# Patient Record
Sex: Male | Born: 1955 | Race: White | Hispanic: No | Marital: Single | Smoking: Current some day smoker
Health system: Southern US, Community
[De-identification: ages and names within clinical notes are randomized; demographics above are authoritative.]

## PROBLEM LIST (undated history)

## (undated) HISTORY — PX: REPAIR OF PERFORATED ULCER: SHX6065

---

## 2002-07-11 ENCOUNTER — Emergency Department (HOSPITAL_COMMUNITY): Admission: EM | Admit: 2002-07-11 | Discharge: 2002-07-11 | Payer: Self-pay | Admitting: Emergency Medicine

## 2002-07-11 ENCOUNTER — Encounter: Payer: Self-pay | Admitting: Emergency Medicine

## 2004-07-08 ENCOUNTER — Inpatient Hospital Stay (HOSPITAL_COMMUNITY): Admission: EM | Admit: 2004-07-08 | Discharge: 2004-07-13 | Payer: Self-pay | Admitting: Emergency Medicine

## 2005-12-25 ENCOUNTER — Emergency Department (HOSPITAL_COMMUNITY): Admission: EM | Admit: 2005-12-25 | Discharge: 2005-12-26 | Payer: Self-pay | Admitting: Emergency Medicine

## 2014-05-23 ENCOUNTER — Encounter (HOSPITAL_COMMUNITY): Payer: Self-pay

## 2014-05-23 ENCOUNTER — Other Ambulatory Visit: Payer: Self-pay

## 2014-05-23 ENCOUNTER — Emergency Department (HOSPITAL_COMMUNITY)
Admission: EM | Admit: 2014-05-23 | Discharge: 2014-05-23 | Disposition: A | Discharge status: Home / Self Care | Payer: Self-pay | Attending: Emergency Medicine | Admitting: Emergency Medicine

## 2014-05-23 ENCOUNTER — Emergency Department (HOSPITAL_COMMUNITY): Payer: Self-pay

## 2014-05-23 DIAGNOSIS — H1133 Conjunctival hemorrhage, bilateral: Secondary | ICD-10-CM | POA: Insufficient documentation

## 2014-05-23 DIAGNOSIS — Y9289 Other specified places as the place of occurrence of the external cause: Secondary | ICD-10-CM | POA: Insufficient documentation

## 2014-05-23 DIAGNOSIS — Y9389 Activity, other specified: Secondary | ICD-10-CM | POA: Insufficient documentation

## 2014-05-23 DIAGNOSIS — Z23 Encounter for immunization: Secondary | ICD-10-CM | POA: Insufficient documentation

## 2014-05-23 DIAGNOSIS — S01511A Laceration without foreign body of lip, initial encounter: Secondary | ICD-10-CM | POA: Insufficient documentation

## 2014-05-23 DIAGNOSIS — S022XXB Fracture of nasal bones, initial encounter for open fracture: Secondary | ICD-10-CM | POA: Insufficient documentation

## 2014-05-23 DIAGNOSIS — Y998 Other external cause status: Secondary | ICD-10-CM | POA: Insufficient documentation

## 2014-05-23 DIAGNOSIS — Z720 Tobacco use: Secondary | ICD-10-CM | POA: Insufficient documentation

## 2014-05-23 DIAGNOSIS — S40011A Contusion of right shoulder, initial encounter: Secondary | ICD-10-CM | POA: Insufficient documentation

## 2014-05-23 DIAGNOSIS — S20211A Contusion of right front wall of thorax, initial encounter: Secondary | ICD-10-CM | POA: Insufficient documentation

## 2014-05-23 LAB — ETHANOL: Alcohol, Ethyl (B): 277 mg/dL — ABNORMAL HIGH (ref <5)

## 2014-05-23 LAB — RAPID URINE DRUG SCREEN, HOSP PERFORMED
AMPHETAMINES: NOT DETECTED
BARBITURATES: NOT DETECTED
Benzodiazepines: NOT DETECTED
Cocaine: POSITIVE — AB
OPIATES: NOT DETECTED
TETRAHYDROCANNABINOL: NOT DETECTED

## 2014-05-23 MED ORDER — HYDROCODONE-ACETAMINOPHEN 5-325 MG PO TABS: 1.0000 | ORAL_TABLET | Freq: Four times a day (QID) | ORAL | Status: DC | PRN

## 2014-05-23 MED ORDER — CEPHALEXIN 500 MG PO CAPS: 500.0000 mg | ORAL_CAPSULE | Freq: Four times a day (QID) | ORAL | Status: DC

## 2014-05-23 MED ORDER — TETANUS-DIPHTH-ACELL PERTUSSIS 5-2.5-18.5 LF-MCG/0.5 IM SUSP
0.5000 mL | Freq: Once | INTRAMUSCULAR | Status: AC
  Administered 2014-05-23: 0.5 mL via INTRAMUSCULAR
  Filled 2014-05-23: qty 0.5

## 2014-05-23 MED ORDER — LIDOCAINE HCL (PF) 1 % IJ SOLN
10.0000 mL | Freq: Once | INTRAMUSCULAR | Status: AC
  Administered 2014-05-23: 10 mL via INTRADERMAL
  Filled 2014-05-23: qty 10

## 2014-05-23 MED ORDER — LIDOCAINE HCL (PF) 1 % IJ SOLN: 30.0000 mL | Freq: Once | INTRAMUSCULAR | Status: DC
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
## 2014-05-23 NOTE — ED Provider Notes (Signed)
LACERATION REPAIR Date/Time: 05/23/2014 7:36 AM Performed by: Margarita Mail Authorized by: Margarita Mail Consent: Verbal consent obtained. Consent given by: patient Patient identity confirmed: provided demographic data Time out: Immediately prior to procedure a "time out" was called to verify the correct patient, procedure, equipment, support staff and site/side marked as required. Body area: mouth Location details: upper lip, interior Laceration length: 3 cm Vascular damage: no Anesthesia: local infiltration Local anesthetic: lidocaine 1% without epinephrine Anesthetic total: 3 ml Patient sedated: no Preparation: Patient was prepped and draped in the usual sterile fashion. Irrigation solution: saline Irrigation method: syringe Amount of cleaning: extensive Debridement: none Subcutaneous closure: 5-0 Vicryl Number of sutures: 5 Technique: complex and simple (SI) Approximation: loose Approximation difficulty: complex Patient tolerance: Patient tolerated the procedure well with no immediate complications Comments: Through and through lip lac involving the vermilion border  LACERATION REPAIR Date/Time: 05/23/2014 7:40 AM Performed by: Margarita Mail Authorized by: Margarita Mail Consent: Verbal consent obtained. Risks and benefits: risks, benefits and alternatives were discussed Consent given by: patient Patient identity confirmed: provided demographic data Time out: Immediately prior to procedure a "time out" was called to verify the correct patient, procedure, equipment, support staff and site/side marked as required. Body area: mouth Location details: upper lip, interior Laceration length: 1 cm Foreign bodies: no foreign bodies Tendon involvement: none Nerve involvement: none Vascular damage: no Anesthesia: local infiltration Local anesthetic: lidocaine 1% without epinephrine and co-phenylcaine spray Anesthetic total: 1 ml Patient sedated: no Preparation: Patient  was prepped and draped in the usual sterile fashion. Irrigation solution: saline Irrigation method: syringe Amount of cleaning: extensive Debridement: none Fascia closure: 5-0 Vicryl Number of sutures: 1 (figure of 8) Technique: complex Patient tolerance: Patient tolerated the procedure well with no immediate complications Comments: Loose   LACERATION REPAIR Date/Time: 05/23/2014 7:44 AM Performed by: Margarita Mail Authorized by: Margarita Mail Consent: Verbal consent obtained. Risks and benefits: risks, benefits and alternatives were discussed Patient identity confirmed: verbally with patient Time out: Immediately prior to procedure a "time out" was called to verify the correct patient, procedure, equipment, support staff and site/side marked as required. Body area: head/neck Laceration length: 1 (stellate) cm Foreign bodies: no foreign bodies Tendon involvement: none Nerve involvement: none Anesthesia: local infiltration Local anesthetic: lidocaine 1% without epinephrine Anesthetic total: 1 ml Patient sedated: no Preparation: Patient was prepped and draped in the usual sterile fashion. Irrigation solution: saline Irrigation method: syringe Amount of cleaning: standard Debridement: none Degree of undermining: none Subcutaneous closure: 5-0 Vicryl Number of sutures: 1 Technique: complex (purse string) Patient tolerance: Patient tolerated the procedure well with no immediate complications Comments: Stellate laceration     Margarita Mail, PA-C 05/23/14 [id number]  [name], MD 05/24/14 [phone]
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
## 2014-05-23 NOTE — ED Notes (Signed)
Margarita Mail, PA in room to suture pt's wound
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
## 2014-05-23 NOTE — ED Notes (Addendum)
Pt ambulated 50+ feet in hallway. Pt has steady gait. Denies dizziness, lightheadedness, or any new pain with ambulation. Pt returned to bed.

## 2014-05-23 NOTE — ED Notes (Signed)
PT offered meal and drink.  Sitting up in bed eating at this time.

## 2019-03-09 DIAGNOSIS — K922 Gastrointestinal hemorrhage, unspecified: Secondary | ICD-10-CM

## 2019-03-09 HISTORY — DX: Gastrointestinal hemorrhage, unspecified: K92.2

## 2019-03-14 ENCOUNTER — Inpatient Hospital Stay (HOSPITAL_COMMUNITY): Payer: Self-pay

## 2019-03-14 ENCOUNTER — Emergency Department (HOSPITAL_COMMUNITY): Payer: Self-pay

## 2019-03-14 ENCOUNTER — Other Ambulatory Visit: Payer: Self-pay

## 2019-03-14 ENCOUNTER — Encounter (HOSPITAL_COMMUNITY): Payer: Self-pay | Admitting: *Deleted

## 2019-03-14 ENCOUNTER — Inpatient Hospital Stay (HOSPITAL_COMMUNITY)
Admission: EM | Admit: 2019-03-14 | Discharge: 2019-03-18 | DRG: 433 | Disposition: A | Discharge status: Home / Self Care | Payer: Self-pay | Attending: Internal Medicine | Admitting: Internal Medicine

## 2019-03-14 DIAGNOSIS — L299 Pruritus, unspecified: Secondary | ICD-10-CM | POA: Diagnosis not present

## 2019-03-14 DIAGNOSIS — F17200 Nicotine dependence, unspecified, uncomplicated: Secondary | ICD-10-CM | POA: Diagnosis present

## 2019-03-14 DIAGNOSIS — K766 Portal hypertension: Secondary | ICD-10-CM | POA: Diagnosis present

## 2019-03-14 DIAGNOSIS — N179 Acute kidney failure, unspecified: Secondary | ICD-10-CM

## 2019-03-14 DIAGNOSIS — Z9889 Other specified postprocedural states: Secondary | ICD-10-CM

## 2019-03-14 DIAGNOSIS — F1490 Cocaine use, unspecified, uncomplicated: Secondary | ICD-10-CM | POA: Diagnosis present

## 2019-03-14 DIAGNOSIS — E872 Acidosis, unspecified: Secondary | ICD-10-CM

## 2019-03-14 DIAGNOSIS — Z720 Tobacco use: Secondary | ICD-10-CM

## 2019-03-14 DIAGNOSIS — I8510 Secondary esophageal varices without bleeding: Secondary | ICD-10-CM | POA: Diagnosis present

## 2019-03-14 DIAGNOSIS — K700 Alcoholic fatty liver: Secondary | ICD-10-CM | POA: Diagnosis present

## 2019-03-14 DIAGNOSIS — K709 Alcoholic liver disease, unspecified: Secondary | ICD-10-CM

## 2019-03-14 DIAGNOSIS — I959 Hypotension, unspecified: Secondary | ICD-10-CM | POA: Diagnosis not present

## 2019-03-14 DIAGNOSIS — D7589 Other specified diseases of blood and blood-forming organs: Secondary | ICD-10-CM

## 2019-03-14 DIAGNOSIS — F1020 Alcohol dependence, uncomplicated: Secondary | ICD-10-CM

## 2019-03-14 DIAGNOSIS — K633 Ulcer of intestine: Secondary | ICD-10-CM | POA: Diagnosis present

## 2019-03-14 DIAGNOSIS — K7030 Alcoholic cirrhosis of liver without ascites: Principal | ICD-10-CM | POA: Diagnosis present

## 2019-03-14 DIAGNOSIS — K275 Chronic or unspecified peptic ulcer, site unspecified, with perforation: Secondary | ICD-10-CM

## 2019-03-14 DIAGNOSIS — K922 Gastrointestinal hemorrhage, unspecified: Secondary | ICD-10-CM

## 2019-03-14 DIAGNOSIS — K5730 Diverticulosis of large intestine without perforation or abscess without bleeding: Secondary | ICD-10-CM | POA: Diagnosis present

## 2019-03-14 DIAGNOSIS — F10230 Alcohol dependence with withdrawal, uncomplicated: Secondary | ICD-10-CM | POA: Diagnosis present

## 2019-03-14 DIAGNOSIS — K635 Polyp of colon: Secondary | ICD-10-CM | POA: Diagnosis present

## 2019-03-14 DIAGNOSIS — D62 Acute posthemorrhagic anemia: Secondary | ICD-10-CM | POA: Diagnosis present

## 2019-03-14 DIAGNOSIS — D649 Anemia, unspecified: Secondary | ICD-10-CM

## 2019-03-14 DIAGNOSIS — Z20822 Contact with and (suspected) exposure to covid-19: Secondary | ICD-10-CM | POA: Diagnosis present

## 2019-03-14 DIAGNOSIS — K439 Ventral hernia without obstruction or gangrene: Secondary | ICD-10-CM | POA: Diagnosis present

## 2019-03-14 DIAGNOSIS — R791 Abnormal coagulation profile: Secondary | ICD-10-CM | POA: Diagnosis not present

## 2019-03-14 DIAGNOSIS — K760 Fatty (change of) liver, not elsewhere classified: Secondary | ICD-10-CM

## 2019-03-14 DIAGNOSIS — F10930 Alcohol use, unspecified with withdrawal, uncomplicated: Secondary | ICD-10-CM

## 2019-03-14 DIAGNOSIS — K769 Liver disease, unspecified: Secondary | ICD-10-CM

## 2019-03-14 DIAGNOSIS — K3189 Other diseases of stomach and duodenum: Secondary | ICD-10-CM | POA: Diagnosis present

## 2019-03-14 DIAGNOSIS — K921 Melena: Secondary | ICD-10-CM

## 2019-03-14 DIAGNOSIS — D72829 Elevated white blood cell count, unspecified: Secondary | ICD-10-CM | POA: Diagnosis not present

## 2019-03-14 LAB — RESPIRATORY PANEL BY RT PCR (FLU A&B, COVID)
Influenza A by PCR: NEGATIVE
Influenza B by PCR: NEGATIVE
SARS Coronavirus 2 by RT PCR: NEGATIVE

## 2019-03-14 LAB — COMPREHENSIVE METABOLIC PANEL
ALT: 20 U/L (ref 0–44)
AST: 46 U/L — ABNORMAL HIGH (ref 15–41)
Albumin: 2.2 g/dL — ABNORMAL LOW (ref 3.5–5.0)
Alkaline Phosphatase: 51 U/L (ref 38–126)
Anion gap: 15 (ref 5–15)
BUN: 32 mg/dL — ABNORMAL HIGH (ref 8–23)
CO2: 11 mmol/L — ABNORMAL LOW (ref 22–32)
Calcium: 8 mg/dL — ABNORMAL LOW (ref 8.9–10.3)
Chloride: 114 mmol/L — ABNORMAL HIGH (ref 98–111)
Creatinine, Ser: 1.64 mg/dL — ABNORMAL HIGH (ref 0.61–1.24)
GFR calc Af Amer: 52 mL/min — ABNORMAL LOW (ref >60)
GFR calc non Af Amer: 44 mL/min — ABNORMAL LOW (ref >60)
Glucose, Bld: 167 mg/dL — ABNORMAL HIGH (ref 70–99)
Potassium: 5.1 mmol/L (ref 3.5–5.1)
Sodium: 140 mmol/L (ref 135–145)
Total Bilirubin: 1.5 mg/dL — ABNORMAL HIGH (ref 0.3–1.2)
Total Protein: 4.7 g/dL — ABNORMAL LOW (ref 6.5–8.1)

## 2019-03-14 LAB — CBC WITH DIFFERENTIAL/PLATELET
Abs Immature Granulocytes: 0.11 10*3/uL — ABNORMAL HIGH (ref 0.00–0.07)
Basophils Absolute: 0.1 10*3/uL (ref 0.0–0.1)
Basophils Relative: 0 %
Eosinophils Absolute: 0.1 10*3/uL (ref 0.0–0.5)
Eosinophils Relative: 1 %
HCT: 16.1 % — ABNORMAL LOW (ref 39.0–52.0)
Hemoglobin: 5 g/dL — CL (ref 13.0–17.0)
Immature Granulocytes: 1 %
Lymphocytes Relative: 16 %
Lymphs Abs: 2.5 10*3/uL (ref 0.7–4.0)
MCH: 35.2 pg — ABNORMAL HIGH (ref 26.0–34.0)
MCHC: 31.1 g/dL (ref 30.0–36.0)
MCV: 113.4 fL — ABNORMAL HIGH (ref 80.0–100.0)
Monocytes Absolute: 1.4 10*3/uL — ABNORMAL HIGH (ref 0.1–1.0)
Monocytes Relative: 9 %
Neutro Abs: 11.1 10*3/uL — ABNORMAL HIGH (ref 1.7–7.7)
Neutrophils Relative %: 73 %
Platelets: 161 10*3/uL (ref 150–400)
RBC: 1.42 MIL/uL — ABNORMAL LOW (ref 4.22–5.81)
RDW: 14.6 % (ref 11.5–15.5)
WBC: 15.1 10*3/uL — ABNORMAL HIGH (ref 4.0–10.5)
nRBC: 0 % (ref 0.0–0.2)

## 2019-03-14 LAB — MAGNESIUM: Magnesium: 1.6 mg/dL — ABNORMAL LOW (ref 1.7–2.4)

## 2019-03-14 LAB — APTT: aPTT: 31 seconds (ref 24–36)

## 2019-03-14 LAB — POC OCCULT BLOOD, ED: Fecal Occult Bld: POSITIVE — AB

## 2019-03-14 LAB — VITAMIN B12: Vitamin B-12: 246 pg/mL (ref 180–914)

## 2019-03-14 LAB — HEPATITIS C ANTIBODY: HCV Ab: NONREACTIVE

## 2019-03-14 LAB — PROTIME-INR
INR: 1.8 — ABNORMAL HIGH (ref 0.8–1.2)
Prothrombin Time: 20.5 seconds — ABNORMAL HIGH (ref 11.4–15.2)

## 2019-03-14 LAB — ABO/RH: ABO/RH(D): A POS

## 2019-03-14 LAB — LACTIC ACID, PLASMA: Lactic Acid, Venous: 3.8 mmol/L (ref 0.5–1.9)

## 2019-03-14 LAB — PREPARE RBC (CROSSMATCH)

## 2019-03-14 LAB — HEPATITIS B SURFACE ANTIGEN: Hepatitis B Surface Ag: NONREACTIVE

## 2019-03-14 LAB — HIV ANTIBODY (ROUTINE TESTING W REFLEX): HIV Screen 4th Generation wRfx: NONREACTIVE

## 2019-03-14 LAB — HEPATITIS B CORE ANTIBODY, TOTAL: Hep B Core Total Ab: NONREACTIVE

## 2019-03-14 LAB — PROCALCITONIN: Procalcitonin: 0.12 ng/mL

## 2019-03-14 LAB — PHOSPHORUS: Phosphorus: 4.9 mg/dL — ABNORMAL HIGH (ref 2.5–4.6)

## 2019-03-14 MED ORDER — SODIUM CHLORIDE 0.9 % IV SOLN
80.0000 mg | Freq: Once | INTRAVENOUS | Status: AC
  Administered 2019-03-14: 80 mg via INTRAVENOUS
  Filled 2019-03-14: qty 80

## 2019-03-14 MED ORDER — SODIUM CHLORIDE 0.9 % IV SOLN: INTRAVENOUS | Status: DC

## 2019-03-14 MED ORDER — THIAMINE HCL 100 MG PO TABS
100.0000 mg | ORAL_TABLET | Freq: Every day | ORAL | Status: DC
  Administered 2019-03-14 – 2019-03-18 (×4): 100 mg via ORAL
  Filled 2019-03-14 (×4): qty 1

## 2019-03-14 MED ORDER — SODIUM CHLORIDE 0.9 % IV SOLN
1.0000 g | Freq: Once | INTRAVENOUS | Status: AC
  Administered 2019-03-14: 1 g via INTRAVENOUS
  Filled 2019-03-14: qty 10

## 2019-03-14 MED ORDER — POLYETHYLENE GLYCOL 3350 17 G PO PACK
17.0000 g | PACK | Freq: Two times a day (BID) | ORAL | Status: DC
  Administered 2019-03-15 (×2): 17 g via ORAL
  Filled 2019-03-14 (×2): qty 1

## 2019-03-14 MED ORDER — PANTOPRAZOLE SODIUM 40 MG IV SOLR: 40.0000 mg | Freq: Two times a day (BID) | INTRAVENOUS | Status: DC

## 2019-03-14 MED ORDER — LABETALOL HCL 5 MG/ML IV SOLN: 5.0000 mg | Freq: Four times a day (QID) | INTRAVENOUS | Status: DC | PRN

## 2019-03-14 MED ORDER — OCTREOTIDE LOAD VIA INFUSION
50.0000 ug | Freq: Once | INTRAVENOUS | Status: DC
  Filled 2019-03-14: qty 25

## 2019-03-14 MED ORDER — PROCHLORPERAZINE EDISYLATE 10 MG/2ML IJ SOLN: 10.0000 mg | Freq: Four times a day (QID) | INTRAMUSCULAR | Status: DC | PRN

## 2019-03-14 MED ORDER — SODIUM CHLORIDE 0.9% IV SOLUTION: Freq: Once | INTRAVENOUS | Status: AC

## 2019-03-14 MED ORDER — THIAMINE HCL 100 MG/ML IJ SOLN
100.0000 mg | Freq: Every day | INTRAMUSCULAR | Status: DC
  Filled 2019-03-14: qty 2

## 2019-03-14 MED ORDER — FOLIC ACID 1 MG PO TABS
1.0000 mg | ORAL_TABLET | Freq: Every day | ORAL | Status: DC
  Administered 2019-03-14 – 2019-03-18 (×4): 1 mg via ORAL
  Filled 2019-03-14 (×4): qty 1

## 2019-03-14 MED ORDER — SODIUM CHLORIDE 0.9 % IV SOLN
8.0000 mg/h | INTRAVENOUS | Status: DC
  Administered 2019-03-14 – 2019-03-16 (×5): 8 mg/h via INTRAVENOUS
  Filled 2019-03-14 (×6): qty 80

## 2019-03-14 MED ORDER — LORAZEPAM 2 MG/ML IJ SOLN: 1.0000 mg | INTRAMUSCULAR | Status: DC | PRN

## 2019-03-14 MED ORDER — LORAZEPAM 2 MG/ML IJ SOLN
1.0000 mg | Freq: Once | INTRAMUSCULAR | Status: AC
  Administered 2019-03-14: 1 mg via INTRAVENOUS
  Filled 2019-03-14: qty 1

## 2019-03-14 MED ORDER — ONDANSETRON HCL 4 MG/2ML IJ SOLN: 4.0000 mg | Freq: Four times a day (QID) | INTRAMUSCULAR | Status: DC | PRN

## 2019-03-14 MED ORDER — LACTATED RINGERS IV BOLUS
1000.0000 mL | Freq: Once | INTRAVENOUS | Status: AC
  Administered 2019-03-14: 1000 mL via INTRAVENOUS

## 2019-03-14 MED ORDER — LORAZEPAM 1 MG PO TABS: 1.0000 mg | ORAL_TABLET | ORAL | Status: DC | PRN

## 2019-03-14 MED ORDER — ADULT MULTIVITAMIN W/MINERALS CH
1.0000 | ORAL_TABLET | Freq: Every day | ORAL | Status: DC
  Administered 2019-03-14 – 2019-03-18 (×4): 1 via ORAL
  Filled 2019-03-14 (×4): qty 1

## 2019-03-14 MED ORDER — SODIUM CHLORIDE 0.9 % IV SOLN: 2.0000 g | INTRAVENOUS | Status: DC

## 2019-03-14 MED ORDER — LORAZEPAM 2 MG/ML IJ SOLN: 0.0000 mg | Freq: Three times a day (TID) | INTRAMUSCULAR | Status: DC

## 2019-03-14 MED ORDER — IOHEXOL 300 MG/ML  SOLN
80.0000 mL | Freq: Once | INTRAMUSCULAR | Status: AC | PRN
  Administered 2019-03-14: 80 mL via INTRAVENOUS

## 2019-03-14 MED ORDER — LORAZEPAM 2 MG/ML IJ SOLN
0.0000 mg | INTRAMUSCULAR | Status: DC
  Administered 2019-03-14 – 2019-03-15 (×2): 2 mg via INTRAVENOUS
  Filled 2019-03-14: qty 1

## 2019-03-14 NOTE — ED Provider Notes (Signed)
[facility] DEPART[facility] Provider Note   CSN: [id number] Arrival date & time: 03/14/19  0854     History Chief Complaint  Patient presents with  . GI Bleeding    Gavin Arroyo is a 64 y.o. male.  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  Patient states yesterday morning at 2 AM he woke up and had a black stool with some blood.  This continued throughout the day and he had more than 10 episodes yesterday.  By yesterday evening he started to feel lightheaded when he would stand and walk and somewhat short of breath.  He denies any chest pain.  He has been drinking ginger ale and does report an episode of emesis this morning and ongoing nausea.  However the emesis was just ginger ale he denies any dark-colored emesis or bloody emesis.  Since yesterday he is also had abdominal pain in his periumbilical region.  He does have a large hernia which he states has gotten bigger over the years and that seems about the same to him but he states this is the same pain he had when he had his perforated ulcer.  He denies any fever, cough.  He does not use aspirin, NSAIDs or any anticoagulation.  He does not take any medications regularly.  He last had something to drink on Thursday and is starting to feel a bit shaky.  He does not know if he withdrawals because he has never gone long enough to know.  He denies any drug use and states 1 pack of cigarettes lasts him about a week.  The history is provided by the patient.       History reviewed. No pertinent past medical history.  There are no problems to display for this patient.   History reviewed. No pertinent surgical history.     History reviewed. No pertinent family history.  Social History   Tobacco Use  . Smoking status: Current Some  Day Smoker  . Smokeless tobacco: Never Used  Substance Use Topics  . Alcohol use: Yes    Alcohol/week: 1.0 standard drinks    Types: 1 Shots of liquor per week    Comment: liquor every day , one gallon a day   . Drug use: Yes    Types: Cocaine    Comment: every day    Home Medications Prior to Admission medications   Medication Sig Start Date End Date Taking? Authorizing Provider  cephALEXin (KEFLEX) 500 MG capsule Take 1 capsule (500 mg total) by mouth 4 (four) times daily. 05/23/14   Horton, Barbette Hair, MD  HYDROcodone-acetaminophen (NORCO/VICODIN) 5-325 MG per tablet Take 1 tablet by mouth every 6 (six) hours as needed. 05/23/14   Horton, Barbette Hair, MD    Allergies    Patient has no known allergies.  Review of Systems   Review of Systems  All other systems reviewed and are negative.   Physical Exam Updated Vital Signs BP 103/67   Pulse 99   Physical Exam Vitals and nursing note reviewed.  Constitutional:      General: He is not in acute distress.    Appearance: He is well-developed and normal weight.  HENT:     Head: Normocephalic and atraumatic.     Mouth/Throat:     Mouth: Mucous membranes are  moist.  Eyes:     Conjunctiva/sclera: Conjunctivae normal.     Pupils: Pupils are equal, round, and reactive to light.     Comments: Pale conjunctivea  Cardiovascular:     Rate and Rhythm: Regular rhythm. Tachycardia present.     Heart sounds: No murmur.  Pulmonary:     Effort: Pulmonary effort is normal. No respiratory distress.     Breath sounds: Normal breath sounds. No wheezing or rales.  Abdominal:     General: There is no distension.     Palpations: Abdomen is soft.     Tenderness: There is no abdominal tenderness. There is no guarding or rebound.     Comments: Large protruding ventral hernia with mild firmness but no severe pain with palpation.  Decreased bowel sounds.  Genitourinary:    Rectum: Guaiac result positive.     Comments: Hematochezia with blood  present dried on the legs and in the rectal area.  No visible hemorrhoids. Musculoskeletal:        General: No tenderness. Normal range of motion.     Cervical back: Normal range of motion and neck supple.  Skin:    General: Skin is warm and dry.     Coloration: Skin is pale.     Findings: No erythema or rash.  Neurological:     Mental Status: He is alert and oriented to person, place, and time.     Comments: Slightly tremulous in the hands and feet  Psychiatric:        Mood and Affect: Mood normal.        Behavior: Behavior normal.        Thought Content: Thought content normal.     ED Results / Procedures / Treatments   Labs (all labs ordered are listed, but only abnormal results are displayed) Labs Reviewed  CBC WITH DIFFERENTIAL/PLATELET - Abnormal; Notable for the following components:      Result Value   WBC 15.1 (*)    RBC 1.42 (*)    Hemoglobin 5.0 (*)    HCT 16.1 (*)    MCV 113.4 (*)    MCH 35.2 (*)    Neutro Abs 11.1 (*)    Monocytes Absolute 1.4 (*)    Abs Immature Granulocytes 0.11 (*)    All other components within normal limits  COMPREHENSIVE METABOLIC PANEL - Abnormal; Notable for the following components:   Chloride 114 (*)    CO2 11 (*)    Glucose, Bld 167 (*)    BUN 32 (*)    Creatinine, Ser 1.64 (*)    Calcium 8.0 (*)    Total Protein 4.7 (*)    Albumin 2.2 (*)    AST 46 (*)    Total Bilirubin 1.5 (*)    GFR calc non Af Amer 44 (*)    GFR calc Af Amer 52 (*)    All other components within normal limits  PROTIME-INR - Abnormal; Notable for the following components:   Prothrombin Time 20.5 (*)    INR 1.8 (*)    All other components within normal limits  MAGNESIUM - Abnormal; Notable for the following components:   Magnesium 1.6 (*)    All other components within normal limits  PHOSPHORUS - Abnormal; Notable for the following components:   Phosphorus 4.9 (*)    All other components within normal limits  POC OCCULT BLOOD, ED - Abnormal; Notable  for the following components:   Fecal Occult Bld POSITIVE (*)  All other components within normal limits  RESPIRATORY PANEL BY RT PCR (FLU A&B, COVID)  APTT  OCCULT BLOOD X 1 CARD TO LAB, STOOL  TYPE AND SCREEN  ABO/RH  PREPARE RBC (CROSSMATCH)    EKG EKG Interpretation  Date/Time:  Saturday March 14 2019 08:57:37 EST Ventricular Rate:  115 PR Interval:    QRS Duration: 93 QT Interval:  346 QTC Calculation: 479 R Axis:   56 Text Interpretation: Sinus tachycardia Borderline prolonged QT interval Confirmed by Blanchie Dessert [phone]) on 03/14/2019 9:11:06 AM   Radiology CT ABDOMEN PELVIS W CONTRAST  Result Date: 03/14/2019 CLINICAL DATA:  64 year old with GI bleed.  Abdominal pain. EXAM: CT ABDOMEN AND PELVIS WITH CONTRAST TECHNIQUE: Multidetector CT imaging of the abdomen and pelvis was performed using the standard protocol following bolus administration of intravenous contrast. CONTRAST:  26mL OMNIPAQUE IOHEXOL 300 MG/ML  SOLN COMPARISON:  None. FINDINGS: Lower chest: Lung bases are clear. Hepatobiliary: Liver is diffusely heterogeneous with patchy areas of heterogeneity and low density. Subtle 9 mm low-density in the right hepatic lobe on sequence 3, image 17 is nonspecific but could represent a small cyst. 11 mm low-density structure along the medial aspect of the right hepatic lobe on image 21. The main portal venous system is patent. Normal appearance of the gallbladder without distension. No biliary dilatation. Left lobe of the liver is prominent and concern for mild nodularity in the liver. Questionable lesion along the anterior left hepatic lobe on sequence 8, image 3. Pancreas: Unremarkable. No pancreatic ductal dilatation or surrounding inflammatory changes. Spleen: Normal in size without focal abnormality. Adrenals/Urinary Tract: Normal appearance of the adrenal glands. Normal appearance of both kidneys. Negative for hydronephrosis. No suspicious renal lesion. Normal appearance  of the urinary bladder. Stomach/Bowel: Small varices around the distal esophagus. Normal appearance of stomach and duodenum. Diverticula involving the sigmoid colon. Ventral hernia containing a loop of the transverse colon. There appears to be a normal appendix in a retrocecal location. No evidence for focal bowel inflammation or bowel obstruction. Mild mesenteric edema throughout the abdomen particularly in the posterior lower abdomen on sequence 3, image 69. Vascular/Lymphatic: Atherosclerotic calcifications involving the abdominal aorta without aneurysm. Main mesenteric arteries are patent. Portal venous system is patent. Esophageal varices. Reproductive: Prostate is unremarkable. Other: Evidence for small inguinal hernias containing fat. There appears to be 2 adjacent ventral hernias. The more cephalad hernia contains a loop of transverse colon and mesentery. There is mild mesenteric edema in this area. The more caudal and adjacent ventral hernia contains fat and mesenteric vessels. No evidence for ascites. Negative for free air. Musculoskeletal: Disc space narrowing at L5-S1. Irregularity along the superior endplate of L3 and L4 probably related to Schmorl's nodes. IMPRESSION: 1. Adjacent ventral hernias. The more cephalad hernia contains transverse colon, fat and mesentery. More caudal ventral hernia contains mesenteric vessels and fat. No evidence for bowel obstruction or focal bowel inflammation. 2. Abnormal liver. Liver is diffusely heterogeneous even on the delayed images. In addition, there is mild nodularity of the liver raising concern for underlying cirrhosis. Heterogeneity may also be related to diffuse fatty infiltration. There is a indeterminate 9 mm lesion in the anterior left hepatic lobe. Recommend follow-up MRI of the liver, with and without contrast, to evaluate this hepatic lesion and evaluate for underlying liver disease. 3. Mild mesenteric edema throughout the abdomen and pelvis is  nonspecific. No ascites. 4. Small esophageal varices and possible cirrhosis. 5.  Aortic Atherosclerosis (ICD10-I70.0). Electronically Signed   By: Quita Skye  Anselm Pancoast M.D.   On: 03/14/2019 11:18   DG Chest Port 1 View  Result Date: 03/14/2019 CLINICAL DATA:  Shortness of breath.  Blood in stool. EXAM: PORTABLE CHEST 1 VIEW COMPARISON:  05/23/2014 FINDINGS: Lordotic positioning noted. The heart size and mediastinal contours are within normal limits. Both lungs are clear. IMPRESSION: No active disease. Electronically Signed   By: Marlaine Hind M.D.   On: 03/14/2019 09:44    Procedures Procedures (including critical care time)  Medications Ordered in ED Medications  pantoprazole (PROTONIX) 80 mg in sodium chloride 0.9 % 100 mL IVPB (has no administration in time range)  pantoprazole (PROTONIX) 80 mg in sodium chloride 0.9 % 100 mL (0.8 mg/mL) infusion (has no administration in time range)  pantoprazole (PROTONIX) injection 40 mg (has no administration in time range)  LORazepam (ATIVAN) injection 1 mg (has no administration in time range)  lactated ringers bolus 1,000 mL (has no administration in time range)    ED Course  I have reviewed the triage vital signs and the nursing notes.  Pertinent labs & imaging results that were available during my care of the patient were reviewed by me and considered in my medical decision making (see chart for details).    MDM Rules/Calculators/A&P                      64 year old male presenting today with abdominal pain and GI bleeding.  Patient with prior history of perforated gastric ulcer approximately 10 to 12 years ago.  Patient is currently hemodynamically stable but is tachycardic from 110-120.  Blood pressure stable at this time.  Patient has hematochezia on exam with black stool present.  He has a large ventral hernia which is difficult to reduce and reports significant pain but pain is not easily reproducible.  Patient drinks large amounts of alcohol daily  but denies any NSAID, aspirin or anticoagulant use.  Patient takes no medications at this time.  Patient was started on IV fluids given Ativan for alcohol withdrawal and started on Protonix.  Labs and imaging pending.  Will monitor closely.  11:03 AM CBC is significant for leukocytosis of 15,000, hemoglobin of 5 and normal platelet count, CMP with creatinine of 1.64 and BUN of 32, normal anion gap and mild elevation of total bilirubin of 1.5, PT INR is elevated at 1.8 and PTT is within normal limits.  Magnesium is decreased at 1.6 and phosphorus is elevated at 4.9.  Chest x-ray without acute findings, Hemoccult was positive.  CT abdomen pelvis is pending but patient was transfused 3 units of blood.  On repeat evaluation heart rate and blood pressure have remained stable and in the low 100s.  He has received Ativan and is no longer tremulous.  He was given a dose of Rocephin.  CT with findings of cirrhosis and hernias without incarceration.  Will discuss with GI and admit to medicine.  CRITICAL CARE Performed by: Mckenzi Buonomo Total critical care time: 45 minutes Critical care time was exclusive of separately billable procedures and treating other patients. Critical care was necessary to treat or prevent imminent or life-threatening deterioration. Critical care was time spent personally by me on the following activities: development of treatment plan with patient and/or surrogate as well as nursing, discussions with consultants, evaluation of patient's response to treatment, examination of patient, obtaining history from patient or surrogate, ordering and performing treatments and interventions, ordering and review of laboratory studies, ordering and review of radiographic studies, pulse oximetry and  re-evaluation of patient's condition.  Final Clinical Impression(s) / ED Diagnoses Final diagnoses:  Gastrointestinal hemorrhage, unspecified gastrointestinal hemorrhage type  Symptomatic anemia  AKI  (acute kidney injury) ([place])  Alcohol withdrawal syndrome without complication [facility])    Rx / DC Orders ED Discharge Orders    None       Blanchie Dessert, MD 03/14/19 1125

## 2019-03-14 NOTE — ED Notes (Signed)
Got patient undress into a gown on the monitor did ekg shown to Dr Maryan Rued patient is resting with call bell in reach

## 2019-03-14 NOTE — Progress Notes (Addendum)
CRITICAL VALUE ALERT  Critical Value: lactic acid 3.8  Date & Time Notied:  03/14/19 1830   Provider Notified: Internal Medicine [phone]  Orders Received/Actions taken: awaiting for order.

## 2019-03-14 NOTE — Consult Note (Signed)
Referring Provider:  Dr. Rosine Door (mid teaching service) Primary Care Physician:  Patient, No Pcp Per Primary Gastroenterologist: None (unassigned)  Reason for Consultation: Hematochezia, cirrhosis  HPI: Gavin Arroyo is a 64 y.o. male being admitted through the emergency room today because of hematochezia characterized by multiple (more than a dozen) burgundy bowel movements over the past several days.  Apparently he vomited a small amount on one occasion, and no blood was noted.  No prior history of GI bleeding.  No further hematochezia since coming on the floor several hours ago.  Admission hemoglobin 5.0 with a macrocytic MCV of 113, BUN 32 (creatinine 1.64).  CT scan shows evidence of liver disease but no obvious diverticulosis in the colon.  He has history of daily significant ethanol consumption but no prior hospitalizations, no previously diagnosed liver disease but while in the emergency room, in addition to alcohol withdrawal, various studies have shown evidence of underlying liver disease, including a CT scan of the abdomen and pelvis which showed an enlarged left lobe of the liver with perhaps some mild nodularity, although no splenomegaly or ascites; blood work showed bilirubin of 1.5, minimal elevation of transaminases, but low albumin 2.2, mildly elevated INR of 1.8, platelets borderline at 161,000.   History reviewed. No pertinent past medical history.  History reviewed. No pertinent surgical history.  Prior to Admission medications   Medication Sig Start Date End Date Taking? Authorizing Provider  cephALEXin (KEFLEX) 500 MG capsule Take 1 capsule (500 mg total) by mouth 4 (four) times daily. 05/23/14   Horton, Barbette Hair, MD  HYDROcodone-acetaminophen (NORCO/VICODIN) 5-325 MG per tablet Take 1 tablet by mouth every 6 (six) hours as needed. 05/23/14   Horton, Barbette Hair, MD    Current Facility-Administered Medications  Medication Dose Route Frequency Provider Last Rate Last  Admin  . 0.9 %  sodium chloride infusion (Manually program via Guardrails IV Fluids)   Intravenous Once Blanchie Dessert, MD      . 0.9 %  sodium chloride infusion   Intravenous Continuous Agyei, Caprice Kluver, MD      . Derrill Memo ON 03/15/2019] cefTRIAXone (ROCEPHIN) 2 g in sodium chloride 0.9 % 100 mL IVPB  2 g Intravenous Q24H Agyei, Obed K, MD      . folic acid (FOLVITE) tablet 1 mg  1 mg Oral Daily Agyei, Obed K, MD      . labetalol (NORMODYNE) injection 5 mg  5 mg Intravenous Q6H PRN Agyei, Obed K, MD      . LORazepam (ATIVAN) injection 0-4 mg  0-4 mg Intravenous Q4H Blanchie Dessert, MD   2 mg at 03/14/19 0939   Followed by  . [START ON 03/16/2019] LORazepam (ATIVAN) injection 0-4 mg  0-4 mg Intravenous Q8H Plunkett, Whitney, MD      . LORazepam (ATIVAN) tablet 1-4 mg  1-4 mg Oral Q1H PRN Blanchie Dessert, MD       Or  . LORazepam (ATIVAN) injection 1-4 mg  1-4 mg Intravenous Q1H PRN Blanchie Dessert, MD      . multivitamin with minerals tablet 1 tablet  1 tablet Oral Daily Marianna Payment, MD      . octreotide (SANDOSTATIN) 2 mcg/mL load via infusion 50 mcg  50 mcg Intravenous Once Blanchie Dessert, MD      . pantoprazole (PROTONIX) 80 mg in sodium chloride 0.9 % 100 mL (0.8 mg/mL) infusion  8 mg/hr Intravenous Continuous Blanchie Dessert, MD 10 mL/hr at 03/14/19 1028 8 mg/hr at 03/14/19 1028  . [  START ON 03/17/2019] pantoprazole (PROTONIX) injection 40 mg  40 mg Intravenous Q12H Plunkett, Loree Fee, MD      . prochlorperazine (COMPAZINE) injection 10 mg  10 mg Intravenous Q6H PRN Agyei, Obed K, MD      . thiamine tablet 100 mg  100 mg Oral Daily Plunkett, Whitney, MD       Or  . thiamine (B-1) injection 100 mg  100 mg Intravenous Daily Blanchie Dessert, MD        Allergies as of 03/14/2019  . (No Known Allergies)    History reviewed. No pertinent family history.  Social History   Socioeconomic History  . Marital status: Single    Spouse name: Not on file  . Number of children: Not on  file  . Years of education: Not on file  . Highest education level: Not on file  Occupational History  . Not on file  Tobacco Use  . Smoking status: Current Some Day Smoker  . Smokeless tobacco: Never Used  Substance and Sexual Activity  . Alcohol use: Yes    Alcohol/week: 1.0 standard drinks    Types: 1 Shots of liquor per week    Comment: liquor every day , one gallon a day   . Drug use: Yes    Types: Cocaine    Comment: every day  . Sexual activity: Not on file  Other Topics Concern  . Not on file  Social History Narrative  . Not on file   Social Determinants of Health   Financial Resource Strain:   . Difficulty of Paying Living Expenses: Not on file  Food Insecurity:   . Worried About Charity fundraiser in the Last Year: Not on file  . Ran Out of Food in the Last Year: Not on file  Transportation Needs:   . Lack of Transportation (Medical): Not on file  . Lack of Transportation (Non-Medical): Not on file  Physical Activity:   . Days of Exercise per Week: Not on file  . Minutes of Exercise per Session: Not on file  Stress:   . Feeling of Stress : Not on file  Social Connections:   . Frequency of Communication with Friends and Family: Not on file  . Frequency of Social Gatherings with Friends and Family: Not on file  . Attends Religious Services: Not on file  . Active Member of Clubs or Organizations: Not on file  . Attends Archivist Meetings: Not on file  . Marital Status: Not on file  Intimate Partner Violence:   . Fear of Current or Ex-Partner: Not on file  . Emotionally Abused: Not on file  . Physically Abused: Not on file  . Sexually Abused: Not on file     Physical Exam: Vital signs in last 24 hours: Temp:  [98.1 F (36.7 C)-99 F (37.2 C)] 99 F (37.2 C) (03/06 1530) Pulse Rate:  [90-119] 94 (03/06 1545) Resp:  [15-23] 15 (03/06 1545) BP: (86-125)/(29-73) 103/50 (03/06 1545) SpO2:  [99 %-100 %] 99 % (03/06 1545)   Pleasant Caucasian  male with multiple tattoos,, in bed in no distress, alert, coherent, not at all shocky.  Does not appear tremulous at this time.  Chest clear, heart without arrhythmia, abdomen without palpable hepatosplenomegaly or tenderness rectal exam by EDP showed burgundy stool.  No peripheral edema or obvious stigmata of chronic liver disease such as palmar erythema, scleral icterus, spiders, or ascites.  No evident focal neurologic deficits.  Oriented to month, date, and year  and is able to do serial twos slowly but accurately.  No asterixis.    Intake/Output from previous day: No intake/output data recorded. Intake/Output this shift: Total I/O In: 4565 [I.V.:3150; Blood:315; IV Piggyback:1100] Out: -   Lab Results: Recent Labs    03/14/19 0948  WBC 15.1*  HGB 5.0*  HCT 16.1*  PLT 161   BMET Recent Labs    03/14/19 0948  NA 140  K 5.1  CL 114*  CO2 11*  GLUCOSE 167*  BUN 32*  CREATININE 1.64*  CALCIUM 8.0*   LFT Recent Labs    03/14/19 0948  PROT 4.7*  ALBUMIN 2.2*  AST 46*  ALT 20  ALKPHOS 51  BILITOT 1.5*   PT/INR Recent Labs    03/14/19 0948  LABPROT 20.5*  INR 1.8*    Studies/Results: CT ABDOMEN PELVIS W CONTRAST  Result Date: 03/14/2019 CLINICAL DATA:  64 year old with GI bleed.  Abdominal pain. EXAM: CT ABDOMEN AND PELVIS WITH CONTRAST TECHNIQUE: Multidetector CT imaging of the abdomen and pelvis was performed using the standard protocol following bolus administration of intravenous contrast. CONTRAST:  68mL OMNIPAQUE IOHEXOL 300 MG/ML  SOLN COMPARISON:  None. FINDINGS: Lower chest: Lung bases are clear. Hepatobiliary: Liver is diffusely heterogeneous with patchy areas of heterogeneity and low density. Subtle 9 mm low-density in the right hepatic lobe on sequence 3, image 17 is nonspecific but could represent a small cyst. 11 mm low-density structure along the medial aspect of the right hepatic lobe on image 21. The main portal venous system is patent. Normal  appearance of the gallbladder without distension. No biliary dilatation. Left lobe of the liver is prominent and concern for mild nodularity in the liver. Questionable lesion along the anterior left hepatic lobe on sequence 8, image 3. Pancreas: Unremarkable. No pancreatic ductal dilatation or surrounding inflammatory changes. Spleen: Normal in size without focal abnormality. Adrenals/Urinary Tract: Normal appearance of the adrenal glands. Normal appearance of both kidneys. Negative for hydronephrosis. No suspicious renal lesion. Normal appearance of the urinary bladder. Stomach/Bowel: Small varices around the distal esophagus. Normal appearance of stomach and duodenum. Diverticula involving the sigmoid colon. Ventral hernia containing a loop of the transverse colon. There appears to be a normal appendix in a retrocecal location. No evidence for focal bowel inflammation or bowel obstruction. Mild mesenteric edema throughout the abdomen particularly in the posterior lower abdomen on sequence 3, image 69. Vascular/Lymphatic: Atherosclerotic calcifications involving the abdominal aorta without aneurysm. Main mesenteric arteries are patent. Portal venous system is patent. Esophageal varices. Reproductive: Prostate is unremarkable. Other: Evidence for small inguinal hernias containing fat. There appears to be 2 adjacent ventral hernias. The more cephalad hernia contains a loop of transverse colon and mesentery. There is mild mesenteric edema in this area. The more caudal and adjacent ventral hernia contains fat and mesenteric vessels. No evidence for ascites. Negative for free air. Musculoskeletal: Disc space narrowing at L5-S1. Irregularity along the superior endplate of L3 and L4 probably related to Schmorl's nodes. IMPRESSION: 1. Adjacent ventral hernias. The more cephalad hernia contains transverse colon, fat and mesentery. More caudal ventral hernia contains mesenteric vessels and fat. No evidence for bowel  obstruction or focal bowel inflammation. 2. Abnormal liver. Liver is diffusely heterogeneous even on the delayed images. In addition, there is mild nodularity of the liver raising concern for underlying cirrhosis. Heterogeneity may also be related to diffuse fatty infiltration. There is a indeterminate 9 mm lesion in the anterior left hepatic lobe. Recommend follow-up MRI of the  liver, with and without contrast, to evaluate this hepatic lesion and evaluate for underlying liver disease. 3. Mild mesenteric edema throughout the abdomen and pelvis is nonspecific. No ascites. 4. Small esophageal varices and possible cirrhosis. 5.  Aortic Atherosclerosis (ICD10-I70.0). Electronically Signed   By: Markus Daft M.D.   On: 03/14/2019 11:18   US Abdomen Limited  Result Date: 03/14/2019 CLINICAL DATA:  Abdominal pain, alcohol use disorder EXAM: ULTRASOUND ABDOMEN LIMITED RIGHT UPPER QUADRANT COMPARISON:  None. FINDINGS: Gallbladder: The gallbladder wall is mildly diffusely thickened measuring 0.6 cm. No gallstones visualized. No sonographic Murphy sign noted by sonographer. Common bile duct: Diameter: 0.5 cm, within normal limits. Liver: No focal lesion identified. The liver parenchymal echogenicity is diffusely increased. Portal vein is patent on color Doppler imaging with normal direction of blood flow towards the liver. Other: None. IMPRESSION: 1. Mild diffuse gallbladder wall thickening which is nonspecific can be seen in the setting of gallbladder inflammation, chronic liver disease, and volume overload, among others. No gallstones identified. 2. Diffusely increased liver parenchymal echogenicity most commonly seen in hepatic steatosis. Electronically Signed   By: Audie Pinto M.D.   On: 03/14/2019 15:40   DG Chest Port 1 View  Result Date: 03/14/2019 CLINICAL DATA:  Shortness of breath.  Blood in stool. EXAM: PORTABLE CHEST 1 VIEW COMPARISON:  05/23/2014 FINDINGS: Lordotic positioning noted. The heart size and  mediastinal contours are within normal limits. Both lungs are clear. IMPRESSION: No active disease. Electronically Signed   By: Marlaine Hind M.D.   On: 03/14/2019 09:44    Impression: 1.  GI bleed of indeterminate origin, characterized by acute recurrent hematochezia.  The burgundy character of the blood does not give a clear differentiation between the upper and lower tract origin.  No radiographic evidence of diverticulosis; however, if this were an upper tract bleed, I would expect greater hemodynamic instability for the degree of anemia and hematochezia. 2.  Severe acute posthemorrhagic anemia 3.  Alcohol use disorder 4.  Radiographic evidence of chronic liver disease without frank cirrhosis or decompensation clinically, radiographically, or on labs  Plan: 1.  Supportive care to include transfusion support, CW IA, and lab monitoring  2.  If the patient should show evidence of acute, brisk, destabilizing bleeding, I would favor getting a bleeding scan to try to differentiate upper from lower tract source.  3.  I would have a low threshold for starting octreotide empirically in this patient with probable liver disease although no clear evidence of portal hypertension, especially if acute/brisk/destabilizing bleeding occurs  4.  Tentatively, I would aim to do endoscopy and colonoscopy on this patient semielectively in the next 1 to 2 days, once his possible early alcohol withdrawal is stabilized, and his hemoglobin has been optimized.  He might be sufficiently stable to start a prep tomorrow with the intent of endoscopy and colonoscopy on Monday.  Conversely, if he becomes acutely unstable with definite or probable upper tract source, our hand might be forced to do earlier endoscopic evaluation.   LOS: 0 days   Youlanda Mighty Shagun Wordell  03/14/2019, 4:30 PM   Pager [phone] If no answer or after 5 PM call [phone]

## 2019-03-14 NOTE — H&P (Addendum)
Date: 03/14/2019               Patient Name:  Gavin Arroyo MRN: [id number]  DOB: [birth year] Age / Sex: 64 y.o., male   PCP: Patient, No Pcp Per              Medical Service: Internal Medicine Teaching Service              Attending Physician: Dr. Velna Ochs, MD    First Contact: Mikael Spray, MS  Pager: [phone]  Second Contact: Dr. Marianna Payment Pager: [phone]  Third Contact Dr. Eileen Stanford Pager: [phone]       After Hours (After 5p/  First Contact Pager: [phone]  weekends / holidays): Second Contact Pager: [phone]   Chief Complaint: Weakness and "black cherry" colored diarrhea  History of Present Illness: Gavin Arroyo is a 64 year old male with a past medical history of significant alcohol use of approximately 1 pint per day, perforated ulcer 10 years ago, and abdominal hernia that presented to the ED today due to about a day and a half of "black cherry" colored diarrhea. On Friday morning around 2AM he had some diarrhea and noticed that it was reddish-black colored. He then proceeded to have approximately 12 loose bowel movements on Friday and an additional 7-8 today, all of which he reports as appearing red/black colored. On Friday evening he says that he began to feel weak, dizzy, and short of breath with exertion, which he attributed to having low blood pressure. He also began to feel nausous this morning and had one episode of non-bloody, non-bilious vomiting. He reports feeling like his abdomen is "full and gassy" recently. He also reports having abdominal pain around his pre-existing hernia, but says that this pain has not increased or changed over the past few weeks. He denies any urinary changes, hemoptysis, hematemesis, confusion.   Meds:  Patient reports taking no medications.  No outpatient medications have been marked as taking for the 03/14/19 encounter Gavin Arroyo Encounter).    Allergies: Allergies as of 03/14/2019  . (No Known Allergies)   Past medical  history: Perforated ulcer Abdominal hernia  Family History:  No family history of liver disease History of mesothelioma in his father, and 2 brothers  Brother with cardiac pacemaker   Social History:  The patient has been living with friends for about a year now and was helping to care for the friends' mother until her recent passing. He says that he helps these friends a lot because he has a car and they do not. He says that this is a stable living situation for him, and that "they are in the best place they can be".  He also mentioned that he has some other friends houses that he can "bounce between".  Diet is primarily sandwiches and fast food; he does not report skipping meals and endorses eating generally 3 meals a day.  He denies illicit drug use. Admits to drinking about 1 pint of liquor a day, but says that this is often shared with his 2 roommates. He says that he has smoked about a pack of cigarettes every 5 days or so for about 15 years.   Review of Systems: Review of Systems  Respiratory: Negative for cough and hemoptysis.   Gastrointestinal: Positive for abdominal pain, blood in stool, diarrhea, melena, nausea and vomiting.  Genitourinary: Negative for dysuria, frequency and urgency.  Neurological: Positive for dizziness.    Physical Exam: Blood pressure (!) 112/56, pulse (!) 102,  temperature 98.3 F (36.8 C), temperature source Oral, resp. rate (!) 23, height 5\' 10"  (1.778 m), weight 64.9 kg, SpO2 100 %.   Physical Exam  Constitutional: He is oriented to person, place, and time.  Eyes: Pupils are equal, round, and reactive to light.  Cardiovascular: Normal heart sounds. Tachycardia present. Exam reveals no gallop and no friction rub.  No murmur heard. Respiratory: Effort normal and breath sounds normal. No respiratory distress. He has no wheezes. He has no rales.  GI: He exhibits distension. There is no abdominal tenderness. There is no rebound. A hernia is present.   Neurological: He is alert and oriented to person, place, and time. He displays tremor.  Skin: Skin is warm and dry.  HEENT: there is some slight yellow discoloration noted in the whites of his eyes Neuro: no asterixis, no clonus   EKG: personally reviewed my interpretation is sinus tachycardia without evidence of ischemia.   CXR: personally reviewed my interpretation is esophagus is midline, costodiaphragmatic angles are normal, diaphragm is slightly high on the right side, heart appears to be enlarged compared to previous Xray 4 years ago.   CT Abdomen with contrast:  1. Adjacent ventral hernias. The more cephalad hernia contains transverse colon, fat and mesentery. More caudal ventral hernia contains mesenteric vessels and fat. No evidence for bowel obstruction or focal bowel inflammation. 2. Abnormal liver. Liver is diffusely heterogeneous even on the delayed images. In addition, there is mild nodularity of the liver raising concern for underlying cirrhosis. Heterogeneity may also be related to diffuse fatty infiltration. There is a indeterminate 9 mm lesion in the anterior left hepatic lobe. Recommend follow-up MRI of the liver, with and without contrast, to evaluate this hepatic lesion and evaluate for underlying liver disease. 3. Mild mesenteric edema throughout the abdomen and pelvis is nonspecific. No ascites. 4. Small esophageal varices and possible cirrhosis. 5.  Aortic Atherosclerosis (ICD10-I70.0).  CBC Latest Ref Rng & Units 03/14/2019  WBC 4.0 - 10.5 K/uL 15.1(H)  Hemoglobin 13.0 - 17.0 g/dL 5.0(LL)  Hematocrit 39.0 - 52.0 % 16.1(L)  Platelets 150 - 400 K/uL 161   CMP Latest Ref Rng & Units 03/14/2019  Glucose 70 - 99 mg/dL 167(H)  BUN 8 - 23 mg/dL 32(H)  Creatinine 0.61 - 1.24 mg/dL 1.64(H)  Sodium 135 - 145 mmol/L 140  Potassium 3.5 - 5.1 mmol/L 5.1  Chloride 98 - 111 mmol/L 114(H)  CO2 22 - 32 mmol/L 11(L)  Calcium 8.9 - 10.3 mg/dL 8.0(L)  Total Protein 6.5 -  8.1 g/dL 4.7(L)  Total Bilirubin 0.3 - 1.2 mg/dL 1.5(H)  Alkaline Phos 38 - 126 U/L 51  AST 15 - 41 U/L 46(H)  ALT 0 - 44 U/L 20   INR/Prothrombin Time:  INR  1.8 03/06 PT 20.5 03/06   Ref Range & Units 09:48  Magnesium 1.7 - 2.4 mg/dL 1.6Low      Ref Range & Units 09:48  Phosphorus 2.5 - 4.6 mg/dL 4.9High      Assessment & Plan by Problem: Principal Problem:   GI bleed Active Problems:   Hepatic lesion   NAGMA   Alcohol dependence (HCC)   AKI (acute kidney injury) ([place])  Summary: Gavin Arroyo is a 64 year old male with a past medical history of significant alcohol abuse, perforated ulcer, and ventral hernia who presented with 1 day history of hematochezia/melana here for evaluation of GI bleed   GI bleed:  Acute blood loss anemia with macrocytosis.  Patient presents with  a 1 day history of hematochezia with shortness of breath and weakness concerning for GI bleed.  Patient does have an elevated BUN with a history of ruptured gastric ulcer which raises my suspicion for peptic ulcers as the source of the bleed.  Chest x-ray and CT abdomen pelvis does not show any evidence of free air in the abdomen.  Patient also has evidence of small esophageal varices on CT abdomen pelvis.  Ultrasound shows increased liver nodularity significant for alcohol induced steatosis, but patient denies any hematemesis at this time.  CT scan of the abdomen also reveal evidence of diverticula involving the sigmoid colon.  He had a hemoglobin of 5, hematocrit of 16 and MCV of 113.  Will transfuse 3 unit PRBC -Follow-up posttransfusion hemoglobin -Greatly appreciate GI recommendations -Daily CBC -Iron studies pending   Hepatic lesion: CT scan revealed a nodular liver suspect for cirrhosis due to chronic alcohol use, as well as a a hepatic lesion in the anterior left lobe.  Radiology recommended MRI of liver with and without contrast - screen for HBV, HCV, HIV   Probable cirrhosis: Given long standing  history of alcohol abuse and mild transaminitis on CMP and synthetic liver function abnormalities my suspicion for cirrhosis is high.  Abdominal ultrasound revealed diffusely increased liver parenchyma echogenicity most likely consistent with hepatic steatosis   Alcohol dependence: He reports that he drinks 1 gallon of liquor daily and has been doing so for the past 6 months.  He does not mention of any withdrawal symptoms - give folate - give thiamine  - check B12 and folate levels - On CIWA with ativan   AKI/NAGMA: Likely pre-renal due to volume loss from diarrhea.  - IV fluids for volume repletion  - follow with BMP   Dispo: Admit patient to Inpatient with expected length of stay greater than 2 midnights.  Signed: Marianna Payment, MD 03/14/2019, 5:16 PM  Pager: [phone]

## 2019-03-14 NOTE — ED Triage Notes (Signed)
PT reported he had black tar stools tha started this AM. Pt drinks 1 gallon of Liquor a day. Pt reports this is the first time for blood in stool.

## 2019-03-14 NOTE — ED Notes (Signed)
PT to CT dept 

## 2019-03-15 LAB — CBC
HCT: 18.4 % — ABNORMAL LOW (ref 39.0–52.0)
Hemoglobin: 6.1 g/dL — CL (ref 13.0–17.0)
MCH: 33 pg (ref 26.0–34.0)
MCHC: 33.2 g/dL (ref 30.0–36.0)
MCV: 99.5 fL (ref 80.0–100.0)
Platelets: 94 10*3/uL — ABNORMAL LOW (ref 150–400)
RBC: 1.85 MIL/uL — ABNORMAL LOW (ref 4.22–5.81)
RDW: 17.2 % — ABNORMAL HIGH (ref 11.5–15.5)
WBC: 12.1 10*3/uL — ABNORMAL HIGH (ref 4.0–10.5)
nRBC: 0.3 % — ABNORMAL HIGH (ref 0.0–0.2)

## 2019-03-15 LAB — URINALYSIS, ROUTINE W REFLEX MICROSCOPIC
Bilirubin Urine: NEGATIVE
Glucose, UA: NEGATIVE mg/dL
Hgb urine dipstick: NEGATIVE
Ketones, ur: NEGATIVE mg/dL
Leukocytes,Ua: NEGATIVE
Nitrite: NEGATIVE
Protein, ur: NEGATIVE mg/dL
Specific Gravity, Urine: 1.019 (ref 1.005–1.030)
pH: 5 (ref 5.0–8.0)

## 2019-03-15 LAB — COMPREHENSIVE METABOLIC PANEL
ALT: 20 U/L (ref 0–44)
AST: 42 U/L — ABNORMAL HIGH (ref 15–41)
Albumin: 1.9 g/dL — ABNORMAL LOW (ref 3.5–5.0)
Alkaline Phosphatase: 40 U/L (ref 38–126)
Anion gap: 6 (ref 5–15)
BUN: 29 mg/dL — ABNORMAL HIGH (ref 8–23)
CO2: 20 mmol/L — ABNORMAL LOW (ref 22–32)
Calcium: 7.4 mg/dL — ABNORMAL LOW (ref 8.9–10.3)
Chloride: 115 mmol/L — ABNORMAL HIGH (ref 98–111)
Creatinine, Ser: 1.08 mg/dL (ref 0.61–1.24)
GFR calc Af Amer: >60 mL/min (ref >60)
GFR calc non Af Amer: >60 mL/min (ref >60)
Glucose, Bld: 112 mg/dL — ABNORMAL HIGH (ref 70–99)
Potassium: 3.9 mmol/L (ref 3.5–5.1)
Sodium: 141 mmol/L (ref 135–145)
Total Bilirubin: 1.8 mg/dL — ABNORMAL HIGH (ref 0.3–1.2)
Total Protein: 3.8 g/dL — ABNORMAL LOW (ref 6.5–8.1)

## 2019-03-15 LAB — LACTIC ACID, PLASMA: Lactic Acid, Venous: 1.9 mmol/L (ref 0.5–1.9)

## 2019-03-15 LAB — PROTIME-INR
INR: 1.7 — ABNORMAL HIGH (ref 0.8–1.2)
Prothrombin Time: 19.6 seconds — ABNORMAL HIGH (ref 11.4–15.2)

## 2019-03-15 LAB — PREPARE RBC (CROSSMATCH)

## 2019-03-15 LAB — HEMOGLOBIN AND HEMATOCRIT, BLOOD
HCT: 19.3 % — ABNORMAL LOW (ref 39.0–52.0)
Hemoglobin: 6.7 g/dL — CL (ref 13.0–17.0)

## 2019-03-15 LAB — HEPATITIS B SURFACE ANTIBODY, QUANTITATIVE: Hep B S AB Quant (Post): 23.7 m[IU]/mL (ref >9.9)

## 2019-03-15 MED ORDER — LACTATED RINGERS IV BOLUS
1000.0000 mL | Freq: Once | INTRAVENOUS | Status: AC
  Administered 2019-03-15: 1000 mL via INTRAVENOUS

## 2019-03-15 MED ORDER — SODIUM CHLORIDE 0.9 % IV SOLN
50.0000 ug/h | INTRAVENOUS | Status: DC
  Administered 2019-03-15 – 2019-03-16 (×3): 50 ug/h via INTRAVENOUS
  Filled 2019-03-15 (×3): qty 1

## 2019-03-15 MED ORDER — DIPHENHYDRAMINE HCL 25 MG PO CAPS
25.0000 mg | ORAL_CAPSULE | Freq: Once | ORAL | Status: AC
  Administered 2019-03-15: 25 mg via ORAL
  Filled 2019-03-15: qty 1

## 2019-03-15 MED ORDER — SODIUM CHLORIDE 0.9% IV SOLUTION: Freq: Once | INTRAVENOUS | Status: AC

## 2019-03-15 MED ORDER — SODIUM CHLORIDE 0.9% IV SOLUTION: Freq: Once | INTRAVENOUS | Status: DC

## 2019-03-15 MED ORDER — VITAMIN K1 10 MG/ML IJ SOLN
1.0000 mg | Freq: Once | INTRAVENOUS | Status: AC
  Administered 2019-03-15: 1 mg via INTRAVENOUS
  Filled 2019-03-15: qty 0.1

## 2019-03-15 MED ORDER — PEG 3350-KCL-NA BICARB-NACL 420 G PO SOLR
4000.0000 mL | Freq: Once | ORAL | Status: AC
  Administered 2019-03-15: 4000 mL via ORAL
  Filled 2019-03-15: qty 4000

## 2019-03-15 MED ORDER — LACTATED RINGERS IV SOLN: INTRAVENOUS | Status: DC

## 2019-03-15 NOTE — Progress Notes (Signed)
GI bleed of indeterminate origin: Patient indicates he had 1 small bowel movement today which did not look as bloody as what he was having prior to admission.  He has no orthostatic symptoms walking to the bathroom.  However, of some concern is the fact that he received 3 units of packed cells last night, with a less than expected posttransfusion rise from 5.0 to 6.1.  Vital signs show somewhat low blood pressure, but on exam, the patient is lying in bed in no distress whatsoever, very coherent, skin warm and dry, radial pulse full, not tachycardic.  Possible early cirrhosis related to alcohol: Labs today show platelet count of 94,000 and post-hydration albumin of 1.9.  INR remains slightly abnormal at 1.7.  Bilirubin is slightly higher at 1.8 the patient is very alert, coherent, and appropriate, without tremulousness to suggest active alcohol withdrawal.  Impression:  1.  On balance, I do not think the patient is having active GI bleeding.  There had been initial discussion with the medicine teaching service that we should proceed to a bleeding scan, in view of the absence of an appropriate rise in hemoglobin following his 3 unit transfusion.  However, clinically, there does not seem to be sufficient evidence of active bleeding to justify that test.  2.  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  Ventral hernia, longstanding following surgery for perforated ulcer  Plan:  1.  Hold off on bleeding scan, see above discussion  2.  Endoscopy and colonoscopy tomorrow.  Petra Kuba, purpose, risks, and limitations of procedures discussed with patient, including possible variceal banding.  He is agreeable to proceed.  We will begin the prep this afternoon.  3.  I have discussed the plan with the medicine teaching service and they are agreeable.  I suggested an additional CBC later today to try to get a better idea of the trajectory of the patient's hemoglobin level; he is about to receive an additional unit of  blood.  Cleotis Nipper, M.D. Pager [phone] If no answer or after 5 PM call [phone]

## 2019-03-15 NOTE — H&P (View-Only) (Signed)
GI bleed of indeterminate origin: Patient indicates he had 1 small bowel movement today which did not look as bloody as what he was having prior to admission.  He has no orthostatic symptoms walking to the bathroom.  However, of some concern is the fact that he received 3 units of packed cells last night, with a less than expected posttransfusion rise from 5.0 to 6.1.  Vital signs show somewhat low blood pressure, but on exam, the patient is lying in bed in no distress whatsoever, very coherent, skin warm and dry, radial pulse full, not tachycardic.  Possible early cirrhosis related to alcohol: Labs today show platelet count of 94,000 and post-hydration albumin of 1.9.  INR remains slightly abnormal at 1.7.  Bilirubin is slightly higher at 1.8 the patient is very alert, coherent, and appropriate, without tremulousness to suggest active alcohol withdrawal.  Impression:  1.  On balance, I do not think the patient is having active GI bleeding.  There had been initial discussion with the medicine teaching service that we should proceed to a bleeding scan, in view of the absence of an appropriate rise in hemoglobin following his 3 unit transfusion.  However, clinically, there does not seem to be sufficient evidence of active bleeding to justify that test.  2.  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  Ventral hernia, longstanding following surgery for perforated ulcer  Plan:  1.  Hold off on bleeding scan, see above discussion  2.  Endoscopy and colonoscopy tomorrow.  Petra Kuba, purpose, risks, and limitations of procedures discussed with patient, including possible variceal banding.  He is agreeable to proceed.  We will begin the prep this afternoon.  3.  I have discussed the plan with the medicine teaching service and they are agreeable.  I suggested an additional CBC later today to try to get a better idea of the trajectory of the patient's hemoglobin level; he is about to receive an additional unit of  blood.  Cleotis Nipper, M.D. Pager [phone] If no answer or after 5 PM call [phone]

## 2019-03-15 NOTE — Plan of Care (Signed)
  Problem: Education: Goal: Knowledge of General Education information will improve Description: Including pain rating scale, medication(s)/side effects and non-pharmacologic comfort measures Outcome: Progressing  Discussed blood transfusion reactions, such as sudden onset of itching, kidney pain or SOB.  Problem: Activity: Goal: Risk for activity intolerance will decrease Outcome: Progressing  Pt calls before getting up, denies fatigue, dizziness, SOB on ambulating to bathroom.  Problem: Coping: Goal: Level of anxiety will decrease Outcome: Progressing  Ativan and benedryl given for CIWA/itching.

## 2019-03-15 NOTE — Progress Notes (Signed)
Subjective: HD#1 Events Overnight: Patient continued to have hematochezia overnight.  Patient was seen this morning on rounds.  Patient was resting comfortably in his bed.  He denies any new symptoms at this time.  All questions were thoroughly answered  Objective:  Vital signs in last 24 hours: Vitals:   03/15/19 0348 03/15/19 0933 03/15/19 0935 03/15/19 0958  BP: (!) 101/50  (!) 101/53 (!) 99/55  Pulse: 89 93 88 83  Resp: 14  18 18   Temp: 98.7 F (37.1 C)  98 F (36.7 C) 98.5 F (36.9 C)  TempSrc: Oral  Oral Oral  SpO2: 100%  98% 98%  Weight:      Height:      Supplemental O2: room air CIWA Scores: 0-1   Physical Exam: Physical Exam  Constitutional: He is oriented to person, place, and time. No distress.  HENT:  Head: Normocephalic and atraumatic.  Eyes: EOM are normal.  Cardiovascular: Normal rate and intact distal pulses.  Pulmonary/Chest: Effort normal and breath sounds normal. No respiratory distress. He has no wheezes. He exhibits no tenderness.  Abdominal: There is abdominal tenderness.  Musculoskeletal:        General: Deformity (abdominal hernia) present.     Cervical back: Normal range of motion.  Neurological: He is alert and oriented to person, place, and time.  Skin: Skin is warm and dry. He is not diaphoretic.    Filed Weights   03/14/19 1631 03/15/19 0329  Weight: 64.9 kg 66.8 kg     Intake/Output Summary (Last 24 hours) at 03/15/2019 1044 Last data filed at 03/15/2019 0400 Gross per 24 hour  Intake 6918.09 ml  Output 1150 ml  Net 5768.09 ml    Risk Score:  N/A  Pertinent labs/Imaging: CBC Latest Ref Rng & Units 03/15/2019 03/14/2019  WBC 4.0 - 10.5 K/uL 12.1(H) 15.1(H)  Hemoglobin 13.0 - 17.0 g/dL 6.1(LL) 5.0(LL)  Hematocrit 39.0 - 52.0 % 18.4(L) 16.1(L)  Platelets 150 - 400 K/uL 94(L) 161    CMP Latest Ref Rng & Units 03/15/2019 03/14/2019  Glucose 70 - 99 mg/dL 112(H) 167(H)  BUN 8 - 23 mg/dL 29(H) 32(H)  Creatinine 0.61 - 1.24 mg/dL 1.08  1.64(H)  Sodium 135 - 145 mmol/L 141 140  Potassium 3.5 - 5.1 mmol/L 3.9 5.1  Chloride 98 - 111 mmol/L 115(H) 114(H)  CO2 22 - 32 mmol/L 20(L) 11(L)  Calcium 8.9 - 10.3 mg/dL 7.4(L) 8.0(L)  Total Protein 6.5 - 8.1 g/dL 3.8(L) 4.7(L)  Total Bilirubin 0.3 - 1.2 mg/dL 1.8(H) 1.5(H)  Alkaline Phos 38 - 126 U/L 40 51  AST 15 - 41 U/L 42(H) 46(H)  ALT 0 - 44 U/L 20 20    Hepatic Function Latest Ref Rng & Units 03/15/2019 03/14/2019  Total Protein 6.5 - 8.1 g/dL 3.8(L) 4.7(L)  Albumin 3.5 - 5.0 g/dL 1.9(L) 2.2(L)  AST 15 - 41 U/L 42(H) 46(H)  ALT 0 - 44 U/L 20 20  Alk Phosphatase 38 - 126 U/L 40 51  Total Bilirubin 0.3 - 1.2 mg/dL 1.8(H) 1.5(H)   Prothrombin Time 11.4 - 15.2 seconds 20.5High    INR 0.8 - 1.2 1.8High    aPTT: 31 HCV: non-reactive HBV: non-reactive HIV: non-reactive  Mg: 1.6 Phos: 4.9 B12: 246 Folate: pending FENa: pending  2x BC: pending   U/A: WNL  Assessment/Plan:  Principal Problem:   GI bleed Active Problems:   Hepatic lesion   NAGMA   Alcohol dependence ([place])   AKI (acute kidney injury) ([place])  Patient Summary: Gavin Arroyo is a 64 y.o. with pertinent PMH of alcohol use disorder, perforated ulcer, ventral hernia who presented with hematochezia/melena and admit for GI bleed on hospital day 1  #GI bleed  - SD/p transfusion of 3 units PRBC, post H&H pending - Octreotide started at 50 mcg IV for 5 days -Transfuse another unit of blood and fresh frozen plasma -Administered vitamin K for elevated - EGD/Colonoscopy in 1-2 days - appreciate GI recommendations  #ETOH Hepatic Steatosis/Early Cirrhosis: -Started octreotide - Started vitamin K due to elevated INR of 1.8.   #ETOH use disorder: - CIWA with ativan as needed - Supplement B12, folate, and thiamine  #Prerenal AKI: -Renal function improved today with fluids.  #Leukocytosis: -Resolving without intervention and no clear source.   Diet: NPO IVF: LR,Bolus VTE: SCDs Code: Full PT/OT  recs: Pending TOC recs: Pending   Dispo: Anticipated discharge pending.    Marianna Payment, D.O. [facility], PGY-1 Date 03/15/2019 Time 10:44 AM

## 2019-03-15 NOTE — Progress Notes (Signed)
Patient c/o severe itching for 10 mins, states "I am itching all over".  Fresh frozen plasma transfusing, transfusion stopped and IV flushed with normal saline. VS stable as documented. MD paged. While waiting for call back, patient stated, "I get this at home all the time." On further assessment, patient states he has problems with itching and dry skin often at home. Lungs clear on auscultation.  Requested order for benedryl.

## 2019-03-16 ENCOUNTER — Inpatient Hospital Stay (HOSPITAL_COMMUNITY): Payer: Self-pay | Admitting: Anesthesiology

## 2019-03-16 ENCOUNTER — Encounter (HOSPITAL_COMMUNITY): Payer: Self-pay | Admitting: Internal Medicine

## 2019-03-16 ENCOUNTER — Encounter (HOSPITAL_COMMUNITY)
Admission: EM | Disposition: A | Discharge status: Home / Self Care | Payer: Self-pay | Source: Home / Self Care | Attending: Internal Medicine

## 2019-03-16 HISTORY — PX: POLYPECTOMY: SHX5525

## 2019-03-16 HISTORY — PX: COLONOSCOPY WITH PROPOFOL: SHX5780

## 2019-03-16 HISTORY — PX: ESOPHAGOGASTRODUODENOSCOPY (EGD) WITH PROPOFOL: SHX5813

## 2019-03-16 LAB — COMPREHENSIVE METABOLIC PANEL
ALT: 31 U/L (ref 0–44)
AST: 80 U/L — ABNORMAL HIGH (ref 15–41)
Albumin: 1.8 g/dL — ABNORMAL LOW (ref 3.5–5.0)
Alkaline Phosphatase: 37 U/L — ABNORMAL LOW (ref 38–126)
Anion gap: 7 (ref 5–15)
BUN: 17 mg/dL (ref 8–23)
CO2: 20 mmol/L — ABNORMAL LOW (ref 22–32)
Calcium: 7.2 mg/dL — ABNORMAL LOW (ref 8.9–10.3)
Chloride: 113 mmol/L — ABNORMAL HIGH (ref 98–111)
Creatinine, Ser: 0.82 mg/dL (ref 0.61–1.24)
GFR calc Af Amer: >60 mL/min (ref >60)
GFR calc non Af Amer: >60 mL/min (ref >60)
Glucose, Bld: 106 mg/dL — ABNORMAL HIGH (ref 70–99)
Potassium: 3.8 mmol/L (ref 3.5–5.1)
Sodium: 140 mmol/L (ref 135–145)
Total Bilirubin: 1.6 mg/dL — ABNORMAL HIGH (ref 0.3–1.2)
Total Protein: 3.9 g/dL — ABNORMAL LOW (ref 6.5–8.1)

## 2019-03-16 LAB — CBC
HCT: 21.2 % — ABNORMAL LOW (ref 39.0–52.0)
HCT: 23.1 % — ABNORMAL LOW (ref 39.0–52.0)
Hemoglobin: 7 g/dL — ABNORMAL LOW (ref 13.0–17.0)
Hemoglobin: 7.6 g/dL — ABNORMAL LOW (ref 13.0–17.0)
MCH: 32.1 pg (ref 26.0–34.0)
MCH: 32.5 pg (ref 26.0–34.0)
MCHC: 33 g/dL (ref 30.0–36.0)
MCHC: 32.9 g/dL (ref 30.0–36.0)
MCV: 97.2 fL (ref 80.0–100.0)
MCV: 98.7 fL (ref 80.0–100.0)
Platelets: 81 10*3/uL — ABNORMAL LOW (ref 150–400)
Platelets: 123 10*3/uL — ABNORMAL LOW (ref 150–400)
RBC: 2.18 MIL/uL — ABNORMAL LOW (ref 4.22–5.81)
RBC: 2.34 MIL/uL — ABNORMAL LOW (ref 4.22–5.81)
RDW: 16.6 % — ABNORMAL HIGH (ref 11.5–15.5)
RDW: 17.5 % — ABNORMAL HIGH (ref 11.5–15.5)
WBC: 7.1 10*3/uL (ref 4.0–10.5)
WBC: 6.5 10*3/uL (ref 4.0–10.5)
nRBC: 1.1 % — ABNORMAL HIGH (ref 0.0–0.2)
nRBC: 0.3 % — ABNORMAL HIGH (ref 0.0–0.2)

## 2019-03-16 LAB — PREPARE FRESH FROZEN PLASMA: Unit division: 0

## 2019-03-16 LAB — BPAM FFP
Blood Product Expiration Date: 202103102359
ISSUE DATE / TIME: 202103071248
Unit Type and Rh: 6200

## 2019-03-16 LAB — PROTIME-INR
INR: 1.6 — ABNORMAL HIGH (ref 0.8–1.2)
Prothrombin Time: 18.7 seconds — ABNORMAL HIGH (ref 11.4–15.2)

## 2019-03-16 LAB — FOLATE RBC
Folate, Hemolysate: 174 ng/mL
Folate, RBC: 1299 ng/mL (ref >498)
Hematocrit: 13.4 % — CL (ref 37.5–51.0)

## 2019-03-16 SURGERY — ESOPHAGOGASTRODUODENOSCOPY (EGD) WITH PROPOFOL
Anesthesia: Monitor Anesthesia Care

## 2019-03-16 MED ORDER — PROPOFOL 500 MG/50ML IV EMUL
INTRAVENOUS | Status: DC | PRN
  Administered 2019-03-16: 100 ug/kg/min via INTRAVENOUS

## 2019-03-16 MED ORDER — SODIUM CHLORIDE 0.9 % IV SOLN: INTRAVENOUS | Status: DC

## 2019-03-16 MED ORDER — LIDOCAINE HCL (CARDIAC) PF 100 MG/5ML IV SOSY
PREFILLED_SYRINGE | INTRAVENOUS | Status: DC | PRN
  Administered 2019-03-16: 80 mg via INTRATRACHEAL

## 2019-03-16 MED ORDER — VITAMIN K1 10 MG/ML IJ SOLN
1.0000 mg | Freq: Once | INTRAVENOUS | Status: AC
  Administered 2019-03-16: 1 mg via INTRAVENOUS
  Filled 2019-03-16: qty 0.1

## 2019-03-16 MED ORDER — PROPOFOL 10 MG/ML IV BOLUS
INTRAVENOUS | Status: DC | PRN
  Administered 2019-03-16: 30 mg via INTRAVENOUS

## 2019-03-16 MED ORDER — PANTOPRAZOLE SODIUM 40 MG IV SOLR
40.0000 mg | Freq: Every morning | INTRAVENOUS | Status: DC
  Administered 2019-03-17: 40 mg via INTRAVENOUS
  Filled 2019-03-16: qty 40

## 2019-03-16 MED ORDER — PHENYLEPHRINE HCL-NACL 10-0.9 MG/250ML-% IV SOLN
INTRAVENOUS | Status: DC | PRN
  Administered 2019-03-16: 25 ug/min via INTRAVENOUS

## 2019-03-16 SURGICAL SUPPLY — 25 items

## 2019-03-16 NOTE — Op Note (Signed)
[facility] Patient Name: Gavin Arroyo Procedure Date : 03/16/2019 MRN: [id number] Attending MD: Ronnette Juniper , MD Date of Birth: 1955-10-07 CSN: [id number] Age: 64 Admit Type: Inpatient Procedure:                Colonoscopy Indications:              This is the patient's first colonoscopy, Rectal                            bleeding Providers:                Ronnette Juniper, MD, Benay Pillow, RN, William Dalton,                            Technician Referring MD:             Velna Ochs, MD Medicines:                Monitored Anesthesia Care Complications:            No immediate complications. Estimated Blood Loss:     Estimated blood loss: none. Procedure:                Pre-Anesthesia Assessment:                           - Prior to the procedure, a History and Physical                            was performed, and patient medications and                            allergies were reviewed. The patient's tolerance of                            previous anesthesia was also reviewed. The risks                            and benefits of the procedure and the sedation                            options and risks were discussed with the patient.                            All questions were answered, and informed consent                            was obtained. Prior Anticoagulants: The patient has                            taken no previous anticoagulant or antiplatelet                            agents. ASA Grade Assessment: III - A patient with                            severe  systemic disease. After reviewing the risks                            and benefits, the patient was deemed in                            satisfactory condition to undergo the procedure.                           - Prior to the procedure, a History and Physical                            was performed, and patient medications and                            allergies were reviewed. The patient's  tolerance of                            previous anesthesia was also reviewed. The risks                            and benefits of the procedure and the sedation                            options and risks were discussed with the patient.                            All questions were answered, and informed consent                            was obtained. Prior Anticoagulants: The patient has                            taken no previous anticoagulant or antiplatelet                            agents. ASA Grade Assessment: III - A patient with                            severe systemic disease. After reviewing the risks                            and benefits, the patient was deemed in                            satisfactory condition to undergo the procedure.                           After obtaining informed consent, the colonoscope                            was passed under direct vision. Throughout the  procedure, the patient's blood pressure, pulse, and                            oxygen saturations were monitored continuously. The                            [id number]-[id number] Olympus pediatric colonoscope                            was introduced through the anus and advanced to the                            the cecum, identified by appendiceal orifice and                            ileocecal valve. The colonoscopy was technically                            difficult and complex due to large ventral hernia                            with transverse colon within the hernia. Successful                            completion of the procedure was aided by applying                            abdominal pressure. The patient tolerated the                            procedure well. The quality of the bowel                            preparation was good. Scope In: 2:01:04 PM Scope Out: 2:34:15 PM Scope Withdrawal Time: 0 hours 15 minutes 7 seconds  Total  Procedure Duration: 0 hours 33 minutes 11 seconds  Findings:      The perianal and digital rectal examinations were normal.      Three sessile polyps were found in the transverse colon. The polyps were       10 to 15 mm in size. These polyps were removed with a hot snare.       Resection and retrieval were complete.      Two sessile polyps were found in the ascending colon. The polyps were 9       to 20 mm in size. These polyps were removed with a hot snare. Resection       and retrieval were complete.      A 25 mm polyp was found in the sigmoid colon. The polyp was       pedunculated. The polyp was removed with a hot snare. Resection and       retrieval were complete.      Multiple small and large-mouthed diverticula were found in the sigmoid       colon and descending colon.      The exam was otherwise without abnormality on direct and retroflexion  views. Impression:               - Three 10 to 15 mm polyps in the transverse colon,                            removed with a hot snare. Resected and retrieved.                           - Two 9 to 20 mm polyps in the ascending colon,                            removed with a hot snare. Resected and retrieved.                           - One 25 mm polyp in the sigmoid colon, removed                            with a hot snare. Resected and retrieved.                           - Diverticulosis in the sigmoid colon and in the                            descending colon.                           - The examination was otherwise normal on direct                            and retroflexion views. Moderate Sedation:      Patient did not receive moderate sedation for this procedure, but       instead received monitored anesthesia care. Recommendation:           - Resume regular diet.                           - Continue present medications.                           - Await pathology results.                           - Repeat colonoscopy  in 1 year for surveillance. Procedure Code(s):        --- Professional ---                           [phone], Colonoscopy, flexible; with removal of                            tumor(s), polyp(s), or other lesion(s) by snare                            technique Diagnosis Code(s):        --- Professional ---  K63.5, Polyp of colon                           K62.5, Hemorrhage of anus and rectum                           K57.30, Diverticulosis of large intestine without                            perforation or abscess without bleeding CPT copyright 2019 American Medical Association. All rights reserved. The codes documented in this report are preliminary and upon coder review may  be revised to meet current compliance requirements. Ronnette Juniper, MD 03/16/2019 3:00:38 PM This report has been signed electronically. Number of Addenda: 0

## 2019-03-16 NOTE — Anesthesia Procedure Notes (Signed)
Procedure Name: MAC Date/Time: 03/16/2019 1:50 PM Performed by: Kathryne Hitch, CRNA Pre-anesthesia Checklist: Patient identified, Emergency Drugs available, Suction available, Patient being monitored and Timeout performed Patient Re-evaluated:Patient Re-evaluated prior to induction Oxygen Delivery Method: Nasal cannula Preoxygenation: Pre-oxygenation with 100% oxygen Induction Type: IV induction Dental Injury: Teeth and Oropharynx as per pre-operative assessment

## 2019-03-16 NOTE — Interval H&P Note (Signed)
History and Physical Interval Note: 63/male with hematochezia, painless, diverticulosis on CT, possible cirrhosis and esophageal varices, significant alcohol abuse, for an EGD with possible banding and colonoscopy today.  03/16/2019 1:47 PM  Gavin Arroyo  has presented today for EGD with possible banding and colonoscopy, with the diagnosis of Hematochezia and anemia.  The various methods of treatment have been discussed with the patient and family. After consideration of risks, benefits and other options for treatment, the patient has consented to  Procedure(s): ESOPHAGOGASTRODUODENOSCOPY (EGD) WITH PROPOFOL (N/A) COLONOSCOPY WITH PROPOFOL (N/A) as a surgical intervention.  The patient's history has been reviewed, patient examined, no change in status, stable for surgery.  I have reviewed the patient's chart and labs.  Questions were answered to the patient's satisfaction.     Ronnette Juniper

## 2019-03-16 NOTE — Transfer of Care (Signed)
Immediate Anesthesia Transfer of Care Note  Patient: Gavin Arroyo  Procedure(s) Performed: ESOPHAGOGASTRODUODENOSCOPY (EGD) WITH PROPOFOL (N/A ) COLONOSCOPY WITH PROPOFOL (N/A ) POLYPECTOMY  Patient Location: Endoscopy Unit  Anesthesia Type:MAC  Level of Consciousness: drowsy  Airway & Oxygen Therapy: Patient Spontanous Breathing  Post-op Assessment: Report given to RN  Post vital signs: Reviewed and stable  Last Vitals:  Vitals Value Taken Time  BP    Temp    Pulse    Resp    SpO2      Last Pain:  Vitals:   03/16/19 1335  TempSrc: Oral  PainSc: 0-No pain      Patients Stated Pain Goal: 0 (68/08/81 1031)  Complications: No apparent anesthesia complications

## 2019-03-16 NOTE — Op Note (Signed)
[facility] Patient Name: Gavin Arroyo Procedure Date : 03/16/2019 MRN: [id number] Attending MD: Ronnette Juniper , MD Date of Birth: 07-Jun-1955 CSN: [id number] Age: 64 Admit Type: Inpatient Procedure:                Upper GI endoscopy Indications:              Acute post hemorrhagic anemia, Cirrhosis rule out                            esophageal varices Providers:                Ronnette Juniper, MD, Benay Pillow, RN, William Dalton,                            Technician Referring MD:             Velna Ochs, MD Medicines:                Monitored Anesthesia Care Complications:            No immediate complications. Estimated Blood Loss:     Estimated blood loss: none. Procedure:                Pre-Anesthesia Assessment:                           - Prior to the procedure, a History and Physical                            was performed, and patient medications and                            allergies were reviewed. The patient's tolerance of                            previous anesthesia was also reviewed. The risks                            and benefits of the procedure and the sedation                            options and risks were discussed with the patient.                            All questions were answered, and informed consent                            was obtained. Prior Anticoagulants: The patient has                            taken no previous anticoagulant or antiplatelet                            agents. ASA Grade Assessment: III - A patient with  severe systemic disease. After reviewing the risks                            and benefits, the patient was deemed in                            satisfactory condition to undergo the procedure.                           After obtaining informed consent, the endoscope was                            passed under direct vision. Throughout the                            procedure, the  patient's blood pressure, pulse, and                            oxygen saturations were monitored continuously. The                            [id number]) Olympus gastroscope was                            introduced through the mouth, and advanced to the                            second part of duodenum. The upper GI endoscopy was                            accomplished without difficulty. The patient                            tolerated the procedure well. Scope In: Scope Out: Findings:      Grade I varices were found in the lower third of the esophagus. They       were diminutive in size.      The Z-line was regular and was found 39 cm from the incisors.      Mild portal hypertensive gastropathy was found in the entire examined       stomach.      Localized mild mucosal changes characterized by friability (with contact       bleeding) were found in the cardia.      The cardia and gastric fundus were otherwise normal on retroflexion,       with no evidence of gastric varices.      The examined duodenum was normal except mild nodularity of duodenal bulb       and mild erythema.      Localized moderately erythematous mucosa without bleeding was found in       the gastric antrum. Impression:               - Grade I esophageal varices.                           - Z-line regular, 39 cm from the  incisors.                           - Portal hypertensive gastropathy.                           - Friable (with contact bleeding) mucosa in the                            cardia.                           - Normal examined duodenum.                           - Erythematous mucosa in the antrum.                           - No specimens collected. Moderate Sedation:      Patient did not receive moderate sedation for this procedure, but       instead received monitored anesthesia care. Recommendation:           - Perform a colonoscopy today. Procedure Code(s):        --- Professional  ---                           [phone], Esophagogastroduodenoscopy, flexible,                            transoral; diagnostic, including collection of                            specimen(s) by brushing or washing, when performed                            (separate procedure) Diagnosis Code(s):        --- Professional ---                           K74.60, Unspecified cirrhosis of liver                           I85.10, Secondary esophageal varices without                            bleeding                           K76.6, Portal hypertension                           K31.89, Other diseases of stomach and duodenum                           K92.2, Gastrointestinal hemorrhage, unspecified                           D62, Acute posthemorrhagic anemia CPT copyright 2019 American Medical Association. All rights reserved. The  codes documented in this report are preliminary and upon coder review may  be revised to meet current compliance requirements. Ronnette Juniper, MD 03/16/2019 2:54:08 PM This report has been signed electronically. Number of Addenda: 0

## 2019-03-16 NOTE — Brief Op Note (Signed)
03/14/2019 - 03/16/2019  2:36 PM  PATIENT:  Gavin Arroyo  64 y.o. male  PRE-OPERATIVE DIAGNOSIS:  Hematochezia and anemia  POST-OPERATIVE DIAGNOSIS:  EGD: 4 esophageal varices and portal hypertensive gastrophy  Colon: multiple transverse colon polyps, mutilpe ascending polyps, large sigmoid colon polyp removed with hot snare  PROCEDURE:  Procedure(s): ESOPHAGOGASTRODUODENOSCOPY (EGD) WITH PROPOFOL (N/A) COLONOSCOPY WITH PROPOFOL (N/A) POLYPECTOMY  SURGEON:  Surgeon(s) and Role:    Ronnette Juniper, MD - Primary  PHYSICIAN ASSISTANT:   ASSISTANTS: Benay Pillow, RN, Jamal Maes  ANESTHESIA:   MAC  EBL:  Minimal  BLOOD ADMINISTERED:none  DRAINS: none   LOCAL MEDICATIONS USED:  NONE  SPECIMEN:  Biopsy / Limited Resection  DISPOSITION OF SPECIMEN:  PATHOLOGY  COUNTS:  YES  TOURNIQUET:  * No tourniquets in log *  DICTATION: .Dragon Dictation  PLAN OF CARE: Admit to inpatient   PATIENT DISPOSITION:  PACU-stable   Delay start of Pharmacological VTE agent (>24hrs) due to surgical blood loss or risk of bleeding: not applicable

## 2019-03-16 NOTE — Op Note (Signed)
EGD and colonoscopy performed.  EGD findings: Trace/grade 1 esophageal varices. Mild portal hypertensive gastropathy. Diffuse erythematous gastric cavity. No evidence of gastric varices. Normal-appearing duodenum and duodenal bulb except mild erythema..  Colonoscopy findings: Diverticulosis in sigmoid and descending. 3 transverse colon polyps, between 1 to 1.5 cm, removed by snare polypectomy. 2 ascending colon polyps, between 9 mm to 2 cm in size, removed via snare polypectomy. 1 sigmoid polyp, 2.5 cm in size, removed via snare polypectomy.    Assessment Painless hematochezia likely related to diverticular bleeding, which seems to have resolved. Resume regular diet. Will need a colonoscopy in 1 year for surveillance as an outpatient.  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  Gavin Arroyo

## 2019-03-16 NOTE — Anesthesia Postprocedure Evaluation (Signed)
Anesthesia Post Note  Patient: Gavin Arroyo  Procedure(s) Performed: ESOPHAGOGASTRODUODENOSCOPY (EGD) WITH PROPOFOL (N/A ) COLONOSCOPY WITH PROPOFOL (N/A ) POLYPECTOMY     Patient location during evaluation: Endoscopy Anesthesia Type: MAC Level of consciousness: awake and alert Pain management: pain level controlled Vital Signs Assessment: post-procedure vital signs reviewed and stable Respiratory status: spontaneous breathing, nonlabored ventilation, respiratory function stable and patient connected to nasal cannula oxygen Cardiovascular status: blood pressure returned to baseline and stable Postop Assessment: no apparent nausea or vomiting Anesthetic complications: no    Last Vitals:  Vitals:   03/16/19 1455 03/16/19 1505  BP: (!) 124/57 131/61  Pulse: 72   Resp: 15   Temp:    SpO2: 93%     Last Pain:  Vitals:   03/16/19 1505  TempSrc:   PainSc: 0-No pain                 Barnet Glasgow

## 2019-03-16 NOTE — Anesthesia Preprocedure Evaluation (Addendum)
Anesthesia Evaluation  Patient identified by MRN, date of birth, ID band Patient awake    Reviewed: Allergy & Precautions, NPO status , Patient's Chart, lab work & pertinent test results  Airway Mallampati: II  TM Distance: >3 FB Neck ROM: Full    Dental no notable dental hx. (+) Teeth Intact, Dental Advisory Given,    Pulmonary Current Smoker,    Pulmonary exam normal breath sounds clear to auscultation       Cardiovascular negative cardio ROS Normal cardiovascular exam Rhythm:Regular Rate:Normal     Neuro/Psych negative neurological ROS  negative psych ROS   GI/Hepatic (+)     substance abuse  alcohol use, Likely early cirrhosis  Hematochezia, anemia   Endo/Other  negative endocrine ROS  Renal/GU negative Renal ROS  negative genitourinary   Musculoskeletal negative musculoskeletal ROS (+)   Abdominal   Peds negative pediatric ROS (+)  Hematology  (+) anemia , H/h 7/21.2, s/p 4 units pRBC Elevated INR, received FFP   Anesthesia Other Findings   Reproductive/Obstetrics negative OB ROS                           Anesthesia Physical Anesthesia Plan  ASA: III  Anesthesia Plan: MAC   Post-op Pain Management:    Induction:   PONV Risk Score and Plan: 2 and Propofol infusion and TIVA  Airway Management Planned: Natural Airway and Simple Face Mask  Additional Equipment: None  Intra-op Plan:   Post-operative Plan:   Informed Consent: I have reviewed the patients History and Physical, chart, labs and discussed the procedure including the risks, benefits and alternatives for the proposed anesthesia with the patient or authorized representative who has indicated his/her understanding and acceptance.       Plan Discussed with: CRNA  Anesthesia Plan Comments:         Anesthesia Quick Evaluation

## 2019-03-16 NOTE — Progress Notes (Signed)
Subjective: HD: 2  Overnight events: remained hypotensive in low 100s/55-60. Had an episode of severe pruritis yesterday while receiving FFP; nurse discontinued infusion and administered benadryl. However, patient says that this is something that happens often and is likely not related to FFP infusion.   Gavin Arroyo was seen on rounds this morning and was tired but alert. He conversed easily with the team and reports no further hematochezia, shortness of breath, dizziness, orthostatic dizziness, or hemoptysis. He was informed about upcoming endoscopy and colonoscopy and expressed no anxiety about these procedures.      Objective:  Vital signs in last 24 hours: Vitals:   03/15/19 2150 03/15/19 2221 03/16/19 0158 03/16/19 0638  BP: 101/60 108/61 (!) 112/58 (!) 109/57  Pulse: 84 82 77 78  Resp: 16 17 15 15   Temp: 98.4 F (36.9 C) 98.3 F (36.8 C) 98.5 F (36.9 C) 98.4 F (36.9 C)  TempSrc: Oral Oral Oral Oral  SpO2: 98% 94% 94% 94%  Weight:      Height:       Weight change:   Intake/Output Summary (Last 24 hours) at 03/16/2019 1004 Last data filed at 03/16/2019 0900 Gross per 24 hour  Intake 9840.8 ml  Output --  Net 9840.8 ml   Physical Exam  Constitutional: He is oriented to person, place, and time. No distress.  Cardiovascular: Normal rate, regular rhythm and normal heart sounds.  Respiratory: Effort normal and breath sounds normal. No respiratory distress. He has no wheezes. He has no rales.  GI: Bowel sounds are normal. There is no abdominal tenderness. There is no guarding. A hernia is present.  Musculoskeletal:        General: No edema.  Neurological: He is alert and oriented to person, place, and time.  Skin: Skin is warm and dry. He is not diaphoretic.    Pertinent labs:   Folate: pending  B12: 246 pg/ml  HIV antibody: NON REACTIVE (03/06 1750)   hepatitis serologies: HBV surface Ag Non reactive HBV surface Ab 23.7 mIU/ML HBV core Ab  Non reactive HCV  Ab  Non reactive    INR:  03/16/19 1.6 03/15/19 1.7 03/14/19 1.8  CBC Latest Ref Rng & Units 03/16/2019 03/15/2019 03/15/2019  WBC 4.0 - 10.5 K/uL 7.1 - 12.1(H)  Hemoglobin 13.0 - 17.0 g/dL 7.0(L) 6.7(LL) 6.1(LL)  Hematocrit 39.0 - 52.0 % 21.2(L) 19.3(L) 18.4(L)  Platelets 150 - 400 K/uL 81(L) - 94(L)   BMP Latest Ref Rng & Units 03/16/2019 03/15/2019 03/14/2019  Glucose 70 - 99 mg/dL 106(H) 112(H) 167(H)  BUN 8 - 23 mg/dL 17 29(H) 32(H)  Creatinine 0.61 - 1.24 mg/dL 0.82 1.08 1.64(H)  Sodium 135 - 145 mmol/L 140 141 140  Potassium 3.5 - 5.1 mmol/L 3.8 3.9 5.1  Chloride 98 - 111 mmol/L 113(H) 115(H) 114(H)  CO2 22 - 32 mmol/L 20(L) 20(L) 11(L)  Calcium 8.9 - 10.3 mg/dL 7.2(L) 7.4(L) 8.0(L)     Assessment/Plan:  Principal Problem:   GI bleed Active Problems:   Hepatic lesion   NAGMA   Alcohol dependence (HCC)   AKI (acute kidney injury) ([place])  Summary: Gavin Arroyo is a 64 year old male with a past medical history of significant daily alcohol use of 1 pint a day, perforated ulcer, and ventral abdominal hernia who presented with 1 day of hematochezia, melena, and weakness who was admitted for a GI bleed of undetermined origin.   GI Bleed: The patient had significant anemia with hemoglobin at 5.0 on admission.  He has received 5 units of pRBCs and 1 unit of FFP. His hemoglobin however has not responded as anticipated and remains at 7.0. Otherwise, he remains stable with HR in in the 70s-80s and sustained hypotension likely due to volume loss from GI bleed.  - GI to preform endoscopy and colonoscopy today; appreciate GI further recommendations  - continue octreotide IV 50 mcg IV for 4 days  - Continue PPI - NPO until after EGD/colonoscopy   ETOH Hepatic steatosis/early steatosis: - Continue octreotide  - given 2nd dose of vitamin K for increased INR  ETOH use disorder: - CIWA with ativan as needed - supplement B12, folate, and thiamine   AKI:  - renal function has improved and BUN  and creatinine are now in normal ranges    LOS: 2 days   Mikael Spray, Medical Student 03/16/2019, 10:04 AM   .Lucille Passy

## 2019-03-16 NOTE — TOC Progression Note (Signed)
Transition of Care [facility]) - Progression Note    Patient Details  Name: Gavin Arroyo MRN: [id number] Date of Birth: 09-03-1955  Transition of Care [facility]) CM/SW [place], RN Phone Number: [phone] 03/16/2019, 4:08 PM  Clinical Narrative:  CM consulted to assist patient in getting a primary care provider. Patient prefers provider close to his home in [place]. [facility] contacted and since patient has never been seen there, he has to call and complete a packet with insurance or assistance information. After that he can make the appointment. The patient has been updated and made aware. No further needs noted at this time. Instructions have been added to the after visit summary.          Expected Discharge Plan and Services                                                 Social Determinants of Health (SDOH) Interventions    Readmission Risk Interventions No flowsheet data found.

## 2019-03-17 ENCOUNTER — Encounter: Payer: Self-pay | Admitting: *Deleted

## 2019-03-17 ENCOUNTER — Inpatient Hospital Stay (HOSPITAL_COMMUNITY): Payer: Self-pay

## 2019-03-17 DIAGNOSIS — K469 Unspecified abdominal hernia without obstruction or gangrene: Secondary | ICD-10-CM

## 2019-03-17 DIAGNOSIS — K7460 Unspecified cirrhosis of liver: Secondary | ICD-10-CM

## 2019-03-17 DIAGNOSIS — I959 Hypotension, unspecified: Secondary | ICD-10-CM

## 2019-03-17 DIAGNOSIS — Z7289 Other problems related to lifestyle: Secondary | ICD-10-CM

## 2019-03-17 DIAGNOSIS — I8510 Secondary esophageal varices without bleeding: Secondary | ICD-10-CM

## 2019-03-17 DIAGNOSIS — K3189 Other diseases of stomach and duodenum: Secondary | ICD-10-CM

## 2019-03-17 DIAGNOSIS — K5730 Diverticulosis of large intestine without perforation or abscess without bleeding: Secondary | ICD-10-CM

## 2019-03-17 DIAGNOSIS — K766 Portal hypertension: Secondary | ICD-10-CM

## 2019-03-17 DIAGNOSIS — K922 Gastrointestinal hemorrhage, unspecified: Secondary | ICD-10-CM

## 2019-03-17 DIAGNOSIS — K769 Liver disease, unspecified: Secondary | ICD-10-CM

## 2019-03-17 DIAGNOSIS — J90 Pleural effusion, not elsewhere classified: Secondary | ICD-10-CM

## 2019-03-17 DIAGNOSIS — K635 Polyp of colon: Secondary | ICD-10-CM

## 2019-03-17 LAB — COMPREHENSIVE METABOLIC PANEL
ALT: 31 U/L (ref 0–44)
AST: 75 U/L — ABNORMAL HIGH (ref 15–41)
Albumin: 1.8 g/dL — ABNORMAL LOW (ref 3.5–5.0)
Alkaline Phosphatase: 43 U/L (ref 38–126)
Anion gap: 6 (ref 5–15)
BUN: 11 mg/dL (ref 8–23)
CO2: 22 mmol/L (ref 22–32)
Calcium: 7.3 mg/dL — ABNORMAL LOW (ref 8.9–10.3)
Chloride: 111 mmol/L (ref 98–111)
Creatinine, Ser: 0.94 mg/dL (ref 0.61–1.24)
GFR calc Af Amer: >60 mL/min (ref >60)
GFR calc non Af Amer: >60 mL/min (ref >60)
Glucose, Bld: 123 mg/dL — ABNORMAL HIGH (ref 70–99)
Potassium: 3.8 mmol/L (ref 3.5–5.1)
Sodium: 139 mmol/L (ref 135–145)
Total Bilirubin: 1.1 mg/dL (ref 0.3–1.2)
Total Protein: 3.7 g/dL — ABNORMAL LOW (ref 6.5–8.1)

## 2019-03-17 LAB — CBC
HCT: 18.6 % — ABNORMAL LOW (ref 39.0–52.0)
Hemoglobin: 6.2 g/dL — CL (ref 13.0–17.0)
MCH: 32.8 pg (ref 26.0–34.0)
MCHC: 33.3 g/dL (ref 30.0–36.0)
MCV: 98.4 fL (ref 80.0–100.0)
Platelets: 103 10*3/uL — ABNORMAL LOW (ref 150–400)
RBC: 1.89 MIL/uL — ABNORMAL LOW (ref 4.22–5.81)
RDW: 17.9 % — ABNORMAL HIGH (ref 11.5–15.5)
WBC: 5.7 10*3/uL (ref 4.0–10.5)
nRBC: 0.3 % — ABNORMAL HIGH (ref 0.0–0.2)

## 2019-03-17 LAB — LACTATE DEHYDROGENASE: LDH: 135 U/L (ref 98–192)

## 2019-03-17 LAB — PROTIME-INR
INR: 1.4 — ABNORMAL HIGH (ref 0.8–1.2)
Prothrombin Time: 17.2 seconds — ABNORMAL HIGH (ref 11.4–15.2)

## 2019-03-17 LAB — HEMOGLOBIN AND HEMATOCRIT, BLOOD
HCT: 24.9 % — ABNORMAL LOW (ref 39.0–52.0)
Hemoglobin: 8.2 g/dL — ABNORMAL LOW (ref 13.0–17.0)

## 2019-03-17 LAB — PREPARE RBC (CROSSMATCH)

## 2019-03-17 LAB — SURGICAL PATHOLOGY

## 2019-03-17 MED ORDER — SODIUM CHLORIDE 0.9% IV SOLUTION: Freq: Once | INTRAVENOUS | Status: DC

## 2019-03-17 MED ORDER — GADOBUTROL 1 MMOL/ML IV SOLN
7.0000 mL | Freq: Once | INTRAVENOUS | Status: AC | PRN
  Administered 2019-03-17: 7 mL via INTRAVENOUS

## 2019-03-17 NOTE — Progress Notes (Signed)
Subjective: HD: 3   Overnight events: has remained hypotensive  [name] was seen on rounds this morning and reports feeling well. He denies any dizziness, hematochezia, melena, shortness of breath, and orthostatic symptoms. He was informed of the results of his EGD/colonoscopy as well as his continued anemia. He did not express any distress or anxiety during or directly after the discussion.    Objective:  Vital signs in last 24 hours: Vitals:   03/17/19 0509 03/17/19 0601 03/17/19 0630 03/17/19 0925  BP: 106/63 (!) 115/55 (!) 121/58 (!) 113/54  Pulse: 76 74 80 80  Resp: 17 16 16 17   Temp: 98.9 F (37.2 C) 98.5 F (36.9 C) 98.3 F (36.8 C) 98.6 F (37 C)  TempSrc: Oral Oral Oral Oral  SpO2: 96% 96% 96% 96%  Weight: 72.8 kg     Height:       Weight change:   Intake/Output Summary (Last 24 hours) at 03/17/2019 1351 Last data filed at 03/17/2019 1100 Gross per 24 hour  Intake 888 ml  Output --  Net 888 ml   Physical Exam Cardiovascular:     Rate and Rhythm: Normal rate and regular rhythm.     Pulses: Normal pulses.     Heart sounds: Normal heart sounds. No murmur.  Pulmonary:     Effort: Pulmonary effort is normal. No tachypnea.     Breath sounds: Normal breath sounds.  Abdominal:     General: Bowel sounds are normal. There is no distension.     Palpations: Abdomen is soft.     Tenderness: There is no abdominal tenderness.     Hernia: A hernia is present.  Skin:    General: Skin is warm and dry.  Neurological:     Mental Status: He is alert and oriented to person, place, and time.    CBC Latest Ref Rng & Units 03/17/2019 03/16/2019 03/16/2019  WBC 4.0 - 10.5 K/uL 5.7 6.5 7.1  Hemoglobin 13.0 - 17.0 g/dL 6.2(LL) 7.6(L) 7.0(L)  Hematocrit 39.0 - 52.0 % 18.6(L) 23.1(L) 21.2(L)  Platelets 150 - 400 K/uL 103(L) 123(L) 81(L)   BMP Latest Ref Rng & Units 03/17/2019 03/16/2019 03/15/2019  Glucose 70 - 99 mg/dL 123(H) 106(H) 112(H)  BUN 8 - 23 mg/dL 11 17 29(H)  Creatinine  0.61 - 1.24 mg/dL 0.94 0.82 1.08  Sodium 135 - 145 mmol/L 139 140 141  Potassium 3.5 - 5.1 mmol/L 3.8 3.8 3.9  Chloride 98 - 111 mmol/L 111 113(H) 115(H)  CO2 22 - 32 mmol/L 22 20(L) 20(L)  Calcium 8.9 - 10.3 mg/dL 7.3(L) 7.2(L) 7.4(L)    Ref Range & Units 03:27 1 d ago 2 d ago  Prothrombin Time 11.4 - 15.2 seconds 17.2High   18.7High   19.6High    INR 0.8 - 1.2 1.4High   1.6High  CM  1.7High  CM    AFP tumor marker In progress  MRI liver with and without contrast: 1. Motion degraded exam. 2. Cirrhosis and small periesophageal varices, most consistent with portal venous hypertension. 3. High right hepatic lobe cyst. 2 indeterminate liver lesions, especially given motion degradation on today's exam. LR 3. Consider outpatient pre and post contrast abdominal MRI follow-up at 6 months. 4. Development of small bilateral pleural effusions and abdominal ascites. 5. Edema within the small bowel mesentery and anterior peripancreatic space. Likely related to portal venous hypertension. Cannot exclude pancreatitis. Recommend clinical correlation. 6. Nonobstructive transverse colon within a ventral abdominal wall hernia, as before. 7.  Aortic Atherosclerosis    Assessment/Plan:  Principal Problem:   GI bleed Active Problems:   Hepatic lesion   NAGMA   Alcohol dependence (HCC)   AKI (acute kidney injury) ([place])  Summary: Gavin Arroyo is a 64 year old male with a past medical history of significant daily alcohol use and perforated ulcer who was admitted with a GI bleed determined by colonoscopy/EGD to most likely have originated from sigmoid/descending colon diverticula.  GI Bleed, Colon polyps: The patient presented with hematochezia and melena and underwent EGD/colonoscopy and was found to have grade 1 esophageal varices, mild portal HTN gastropathy, multiple colon polyps, and sigmoid and descending colon diverticula. The diverticula were determined to most likely be the origin of his  hematochezia. Over the course of his stay he has received 4 units of pRBCs and 1 unit of FFP, with most recent RBC transfusion this morning. However, he remains anemic despite these transfusions, with even lower Hgb this morning at 6.2 down from 7.6. This drop however may be due to the multiple polyps removed during his procedure yesterday. At this point his continued anemia appears to be due to unresolved bleeding from diverticula. However, he does have a hepatic lesion identified on CT that may be another potential source of ongoing bleeding.  - Discontinue lactated ringers - Discontinue protonix - follow up with outpatient GI after discharge  - follow up with colonoscopy in 1 year - AFP tumor marker pending  - monitor CBC and adminster pRBCs as needed   ETOH use: - continue folic acid - Continue thiamine - Continue multi vitamin - discontinue octreotide - discontinue CIWA and ativan   Hepatic lesions and cirhosis: Patient had a hepatic lesion identified on CT along the left hepatic lobe. Additionally a low density structure in the right hepatic lobe and another along the medial aspect of of the right hepatic lobe, which may represent a small cyst. Possible explantations for these lesions include cysts, abscesses, and HCC. Given the presentation of this patient a cyst seems to be most likely. Patient underwent MRI with and without contrast today and confirmed right hepatic cyst, but further classification of the 2 other lesions was not possible due to motion degradation during procedure. This MRI also revealed ascites and cirrhosis. Ascites is most likely due to cirrhosis.  - follow up with outpatient MRI with and without contrast with follow up at 6 months - GI consulted; appreciate recommendations  - obtain LFTs   Pleural Effusions: Patient underwent MRI today of liver today and small bilateral pleural effusions were found. However, he does not have any symptoms from this and reports no  difficulty breathing. On exam his lungs also sound clear. These effusions are most liekly a result of his cirrhosis and low albumin. Infection is another possibility, but is less likely given his presentation and lack of signs/symptoms of infection.  - advance to regular diet slowly  - monitor for signs and symptoms of infection   Small bowel edema: Patient underwent hepatic MRI today which also revealed edema in small bowel mesentery and anterior peripancreatic space. This is most likely a result of his portal hypertension due to cirrhosis. Pancreatitis is another possibility, but is less likely given lack of pain and signs/symptoms of infection.  - advance to regular diet slowly  - monitor for signs and symptoms of infection  - follow up with outpatient GI   AKI: Creatinine and BUN remain in normal ranges for 2 days now.  - continue to monitor  with BMP      LOS: 3 days   Mikael Spray, Medical Student 03/17/2019, 1:51 PM

## 2019-03-17 NOTE — Progress Notes (Signed)
Critical Hgb lab value of 6.2. On call attending provider paged. Will continue to monitor  1 unit RBC ordered; admin started at 06:15 with no adverse effects.

## 2019-03-17 NOTE — Progress Notes (Signed)
Subjective: Noted to have a drop in hemoglobin from 7.6 yesterday evening to 6.2 today morning, however he states that he had a small amount of light pink-tinged bowel movement post EGD and colonoscopy. He is supposed to get an MRI done today, he is requesting for his diet to be advanced and is hopeful he can be discharged today.  Objective: Vital signs in last 24 hours: Temp:  [97.7 F (36.5 C)-98.9 F (37.2 C)] 98.3 F (36.8 C) (03/09 0630) Pulse Rate:  [69-80] 80 (03/09 0630) Resp:  [15-17] 16 (03/09 0630) BP: (101-131)/(37-63) 121/58 (03/09 0630) SpO2:  [92 %-97 %] 96 % (03/09 0630) Weight:  [72.8 kg] 72.8 kg (03/09 0509) Weight change:  Last BM Date: 03/16/19  PE: Obvious pallor, no icterus GENERAL: Not in distress, alert, oriented x3, no asterixis ABDOMEN: Large ventral hernia, normal active bowel sounds, nontender EXTREMITIES: No deformity  Lab Results: Results for orders placed or performed during the hospital encounter of 03/14/19 (from the past 48 hour(s))  Prepare RBC     Status: None   Collection Time: 03/15/19  9:30 AM  Result Value Ref Range   Order Confirmation      ORDER PROCESSED BY BLOOD BANK Performed at [facility], [address] [place], [place] [address]   Prepare fresh frozen plasma     Status: None   Collection Time: 03/15/19  9:31 AM  Result Value Ref Range   Unit Number [id number]    Blood Component Type THAWED PLASMA    Unit division 00    Status of Unit ISSUED,FINAL    Transfusion Status      OK TO TRANSFUSE Performed at [facility] [address] [place], [place] [address]   Hemoglobin and hematocrit, blood     Status: Abnormal   Collection Time: 03/15/19  4:58 PM  Result Value Ref Range   Hemoglobin 6.7 (LL) 13.0 - 17.0 g/dL    Comment: REPEATED TO VERIFY THIS CRITICAL RESULT HAS VERIFIED AND BEEN CALLED TO E.EVANS,RN BY PAMELA HENDERSON ON 03 07 2021 AT 1732, AND HAS BEEN READ BACK.     HCT 19.3 (L) 39.0 - 52.0 %     Comment: Performed at [facility], [place] [address], [place], [place] [address]  Prepare RBC     Status: None   Collection Time: 03/15/19  6:12 PM  Result Value Ref Range   Order Confirmation      ORDER PROCESSED BY BLOOD BANK Performed at [facility], [place] [address], [place], [place] [address]   Protime-INR     Status: Abnormal   Collection Time: 03/16/19  3:35 AM  Result Value Ref Range   Prothrombin Time 18.7 (H) 11.4 - 15.2 seconds   INR 1.6 (H) 0.8 - 1.2    Comment: (NOTE) INR goal varies based on device and disease states. Performed at [facility], [place] [address], [place], [place] [address]   CBC     Status: Abnormal   Collection Time: 03/16/19  3:35 AM  Result Value Ref Range   WBC 7.1 4.0 - 10.5 K/uL   RBC 2.18 (L) 4.22 - 5.81 MIL/uL   Hemoglobin 7.0 (L) 13.0 - 17.0 g/dL   HCT 21.2 (L) 39.0 - 52.0 %   MCV 97.2 80.0 - 100.0 fL   MCH 32.1 26.0 - 34.0 pg   MCHC 33.0 30.0 - 36.0 g/dL   RDW 16.6 (H) 11.5 - 15.5 %  Platelets 81 (L) 150 - 400 K/uL    Comment: REPEATED TO VERIFY SPECIMEN CHECKED FOR CLOTS Immature Platelet Fraction may be clinically indicated, consider ordering this additional test [id number] CONSISTENT WITH PREVIOUS RESULT    nRBC 1.1 (H) 0.0 - 0.2 %    Comment: Performed at [facility] [address], [place], [place] [address]  Comprehensive metabolic panel     Status: Abnormal   Collection Time: 03/16/19  3:35 AM  Result Value Ref Range   Sodium 140 135 - 145 mmol/L   Potassium 3.8 3.5 - 5.1 mmol/L   Chloride 113 (H) 98 - 111 mmol/L   CO2 20 (L) 22 - 32 mmol/L   Glucose, Bld 106 (H) 70 - 99 mg/dL    Comment: Glucose reference range applies only to samples taken after fasting for at least 8 hours.   BUN 17 8 - 23 mg/dL   Creatinine, Ser 0.82 0.61 - 1.24 mg/dL   Calcium 7.2 (L) 8.9 - 10.3 mg/dL   Total Protein 3.9 (L) 6.5 - 8.1 g/dL   Albumin 1.8 (L) 3.5 - 5.0 g/dL   AST 80 (H) 15 - 41 U/L   ALT 31 0 - 44 U/L    Alkaline Phosphatase 37 (L) 38 - 126 U/L   Total Bilirubin 1.6 (H) 0.3 - 1.2 mg/dL   GFR calc non Af Amer >60 >60 mL/min   GFR calc Af Amer >60 >60 mL/min   Anion gap 7 5 - 15    Comment: Performed at [facility] [address], [place], [place] [address]  CBC     Status: Abnormal   Collection Time: 03/16/19  8:36 PM  Result Value Ref Range   WBC 6.5 4.0 - 10.5 K/uL   RBC 2.34 (L) 4.22 - 5.81 MIL/uL   Hemoglobin 7.6 (L) 13.0 - 17.0 g/dL   HCT 23.1 (L) 39.0 - 52.0 %   MCV 98.7 80.0 - 100.0 fL   MCH 32.5 26.0 - 34.0 pg   MCHC 32.9 30.0 - 36.0 g/dL   RDW 17.5 (H) 11.5 - 15.5 %   Platelets 123 (L) 150 - 400 K/uL   nRBC 0.3 (H) 0.0 - 0.2 %    Comment: Performed at [facility] [address] [place], [place] [address]  Protime-INR     Status: Abnormal   Collection Time: 03/17/19  3:27 AM  Result Value Ref Range   Prothrombin Time 17.2 (H) 11.4 - 15.2 seconds   INR 1.4 (H) 0.8 - 1.2    Comment: (NOTE) INR goal varies based on device and disease states. Performed at [facility], [place] [address], [place], [place] [address]   CBC     Status: Abnormal   Collection Time: 03/17/19  3:27 AM  Result Value Ref Range   WBC 5.7 4.0 - 10.5 K/uL   RBC 1.89 (L) 4.22 - 5.81 MIL/uL   Hemoglobin 6.2 (LL) 13.0 - 17.0 g/dL    Comment: REPEATED TO VERIFY THIS CRITICAL RESULT HAS VERIFIED AND BEEN CALLED TO JILL TSOUTIS RN. BY TAMEECO CALDWELL ON 03 09 2021 AT 0423, AND HAS BEEN READ BACK.     HCT 18.6 (L) 39.0 - 52.0 %   MCV 98.4 80.0 - 100.0 fL   MCH 32.8 26.0 - 34.0 pg   MCHC 33.3 30.0 - 36.0 g/dL   RDW 17.9 (H) 11.5 - 15.5 %   Platelets 103 (L) 150 - 400 K/uL  Comment: REPEATED TO VERIFY PLATELET COUNT CONFIRMED BY SMEAR SPECIMEN CHECKED FOR CLOTS Immature Platelet Fraction may be clinically indicated, consider ordering this additional test [id number]    nRBC 0.3 (H) 0.0 - 0.2 %    Comment: Performed at [facility], [place] [address], [place], [place]  [address]  Comprehensive metabolic panel     Status: Abnormal   Collection Time: 03/17/19  3:27 AM  Result Value Ref Range   Sodium 139 135 - 145 mmol/L   Potassium 3.8 3.5 - 5.1 mmol/L   Chloride 111 98 - 111 mmol/L   CO2 22 22 - 32 mmol/L   Glucose, Bld 123 (H) 70 - 99 mg/dL    Comment: Glucose reference range applies only to samples taken after fasting for at least 8 hours.   BUN 11 8 - 23 mg/dL   Creatinine, Ser 0.94 0.61 - 1.24 mg/dL   Calcium 7.3 (L) 8.9 - 10.3 mg/dL   Total Protein 3.7 (L) 6.5 - 8.1 g/dL   Albumin 1.8 (L) 3.5 - 5.0 g/dL   AST 75 (H) 15 - 41 U/L   ALT 31 0 - 44 U/L   Alkaline Phosphatase 43 38 - 126 U/L   Total Bilirubin 1.1 0.3 - 1.2 mg/dL   GFR calc non Af Amer >60 >60 mL/min   GFR calc Af Amer >60 >60 mL/min   Anion gap 6 5 - 15    Comment: Performed at [facility] [address], [place], [place] [address]  Prepare RBC     Status: None   Collection Time: 03/17/19  5:03 AM  Result Value Ref Range   Order Confirmation      ORDER PROCESSED BY BLOOD BANK Performed at [facility], [place] [address], [place],  [address]     Studies/Results: No results found.  Medications: I have reviewed the patient's current medications.  Assessment: Hemoglobin dropped from 7.6-6.2 without obvious melena or hematochezia Receiving 1 unit PRBC transfusion today Hemodynamically stable otherwise   Noted to have trace/grade 1 varices with portal hypertensive gastropathy 6 large polyps removed from the colon varying in size from 9 mm to 2.5 cm  Patient likely has cirrhosis because of chronic alcohol use, recommended MRI of the liver with and without contrast and alpha-fetoprotein, both of which have been ordered   Plan: Resume regular diet As previously recommended MRI liver with and without contrast today and alpha-fetoprotein Otherwise can be discharged from GI standpoint but will need an outpatient follow-up for management of chronic liver  disease. Please recall GI if needed  Ronnette Juniper, MD 03/17/2019, 8:38 AM

## 2019-03-18 ENCOUNTER — Encounter (HOSPITAL_COMMUNITY): Payer: Self-pay | Admitting: Internal Medicine

## 2019-03-18 DIAGNOSIS — D649 Anemia, unspecified: Secondary | ICD-10-CM

## 2019-03-18 LAB — CBC
HCT: 21.8 % — ABNORMAL LOW (ref 39.0–52.0)
HCT: 21.9 % — ABNORMAL LOW (ref 39.0–52.0)
Hemoglobin: 7.1 g/dL — ABNORMAL LOW (ref 13.0–17.0)
Hemoglobin: 7.3 g/dL — ABNORMAL LOW (ref 13.0–17.0)
MCH: 31.7 pg (ref 26.0–34.0)
MCH: 32.3 pg (ref 26.0–34.0)
MCHC: 32.6 g/dL (ref 30.0–36.0)
MCHC: 33.3 g/dL (ref 30.0–36.0)
MCV: 97.3 fL (ref 80.0–100.0)
MCV: 96.9 fL (ref 80.0–100.0)
Platelets: 115 10*3/uL — ABNORMAL LOW (ref 150–400)
Platelets: 133 10*3/uL — ABNORMAL LOW (ref 150–400)
RBC: 2.24 MIL/uL — ABNORMAL LOW (ref 4.22–5.81)
RBC: 2.26 MIL/uL — ABNORMAL LOW (ref 4.22–5.81)
RDW: 19.2 % — ABNORMAL HIGH (ref 11.5–15.5)
RDW: 19.1 % — ABNORMAL HIGH (ref 11.5–15.5)
WBC: 5.6 10*3/uL (ref 4.0–10.5)
WBC: 5.8 10*3/uL (ref 4.0–10.5)
nRBC: 0 % (ref 0.0–0.2)
nRBC: 0 % (ref 0.0–0.2)

## 2019-03-18 LAB — COMPREHENSIVE METABOLIC PANEL
ALT: 31 U/L (ref 0–44)
AST: 58 U/L — ABNORMAL HIGH (ref 15–41)
Albumin: 1.8 g/dL — ABNORMAL LOW (ref 3.5–5.0)
Alkaline Phosphatase: 56 U/L (ref 38–126)
Anion gap: 6 (ref 5–15)
BUN: 7 mg/dL — ABNORMAL LOW (ref 8–23)
CO2: 20 mmol/L — ABNORMAL LOW (ref 22–32)
Calcium: 7.6 mg/dL — ABNORMAL LOW (ref 8.9–10.3)
Chloride: 113 mmol/L — ABNORMAL HIGH (ref 98–111)
Creatinine, Ser: 0.84 mg/dL (ref 0.61–1.24)
GFR calc Af Amer: >60 mL/min (ref >60)
GFR calc non Af Amer: >60 mL/min (ref >60)
Glucose, Bld: 100 mg/dL — ABNORMAL HIGH (ref 70–99)
Potassium: 3.7 mmol/L (ref 3.5–5.1)
Sodium: 139 mmol/L (ref 135–145)
Total Bilirubin: 1.2 mg/dL (ref 0.3–1.2)
Total Protein: 4 g/dL — ABNORMAL LOW (ref 6.5–8.1)

## 2019-03-18 LAB — BPAM RBC
Blood Product Expiration Date: 202103272359
Blood Product Expiration Date: 202103272359
Blood Product Expiration Date: 202103272359
Blood Product Expiration Date: 202104012359
Blood Product Expiration Date: 202104062359
Blood Product Expiration Date: 202104062359
ISSUE DATE / TIME: 202103061124
ISSUE DATE / TIME: 202103061802
ISSUE DATE / TIME: 202103062259
ISSUE DATE / TIME: 202103070926
ISSUE DATE / TIME: 202103072130
ISSUE DATE / TIME: 202103090554
Unit Type and Rh: 6200
Unit Type and Rh: 6200
Unit Type and Rh: 6200
Unit Type and Rh: 6200
Unit Type and Rh: 6200
Unit Type and Rh: 6200

## 2019-03-18 LAB — TYPE AND SCREEN
ABO/RH(D): A POS
Antibody Screen: NEGATIVE
Unit division: 0
Unit division: 0
Unit division: 0
Unit division: 0
Unit division: 0
Unit division: 0

## 2019-03-18 LAB — RETICULOCYTES
Immature Retic Fract: 36.4 % — ABNORMAL HIGH (ref 2.3–15.9)
RBC.: 2.21 MIL/uL — ABNORMAL LOW (ref 4.22–5.81)
Retic Count, Absolute: 210.4 10*3/uL — ABNORMAL HIGH (ref 19.0–186.0)
Retic Ct Pct: 9.5 % — ABNORMAL HIGH (ref 0.4–3.1)

## 2019-03-18 LAB — AFP TUMOR MARKER: AFP, Serum, Tumor Marker: 2.6 ng/mL (ref 0.0–8.3)

## 2019-03-18 LAB — PROTIME-INR
INR: 1.4 — ABNORMAL HIGH (ref 0.8–1.2)
Prothrombin Time: 17.2 seconds — ABNORMAL HIGH (ref 11.4–15.2)

## 2019-03-18 LAB — PATHOLOGIST SMEAR REVIEW

## 2019-03-18 NOTE — Progress Notes (Addendum)
Subjective: HD: 4  overnight events: continued to be hypotensive with bp 102-117/54-64, but this has been a continuous trend for him since admission and he remains stable and asymptomatic. His Hr remains 70-80 and RR around 16.   Gavin Arroyo was seen on rounds this morning and was up walking around when we entered the room. He reports feeling well and had no complaints. His continued anemia was discussed as well and he expressed understanding that he will need to continue to be hospitalized and seemed agreeable to this course. The potential for capsular endoscopy or tagged RBC study was discussed and he had no concerns and expressed agreement with this course. He denies hematochezia, hemoptysis, hematuria, shortness of breath, dizziness, orthostatic changes, abdominal pain, and abdominal fullness.    Objective:  Vital signs in last 24 hours: Vitals:   03/17/19 1650 03/17/19 2051 03/18/19 0627 03/18/19 0800  BP: 117/64 115/64 (!) 102/54   Pulse: 71 67 70 68  Resp:  20 18   Temp: 98.3 F (36.8 C) 98.3 F (36.8 C) 98.2 F (36.8 C)   TempSrc: Oral Oral Oral   SpO2: 96% 98% 96%   Weight:   71.8 kg   Height:       Weight change: -1.048 kg  Intake/Output Summary (Last 24 hours) at 03/18/2019 1038 Last data filed at 03/17/2019 1100 Gross per 24 hour  Intake 0 ml  Output --  Net 0 ml   Physical Exam Cardiovascular:     Rate and Rhythm: Normal rate and regular rhythm.     Pulses: Normal pulses.     Heart sounds: Normal heart sounds.  Pulmonary:     Effort: Pulmonary effort is normal.     Breath sounds: Normal breath sounds.  Abdominal:     General: Bowel sounds are normal. There is distension.     Tenderness: There is no abdominal tenderness. There is no guarding.     Hernia: A hernia is present.  Skin:    General: Skin is warm and dry.  Neurological:     General: No focal deficit present.     Mental Status: He is alert and oriented to person, place, and time.      Pertinent labs:   LDH:  03/17/19 135 U/L  Haptoglobin:    In progress  Blood Smear:   In progress    Ref Range & Units 03:33 1 d ago 2 d ago 3 d ago  Prothrombin Time 11.4 - 15.2 seconds 17.2High   17.2High   18.7High   19.6High    INR 0.8 - 1.2 1.4High   1.4High  CM  1.6High  CM  1.7High      Ref Range & Units 05:07  Retic Ct Pct 0.4 - 3.1 % 9.5High    RBC. 4.22 - 5.81 MIL/uL 2.21Low    Retic Count, Absolute 19.0 - 186.0 K/uL 210.4High    Immature Retic Fract 2.3 - 15.9 % 36.4High       CBC Latest Ref Rng & Units 03/18/2019 03/17/2019 03/17/2019  WBC 4.0 - 10.5 K/uL 5.6 - 5.7  Hemoglobin 13.0 - 17.0 g/dL 7.1(L) 8.2(L) 6.2(LL)  Hematocrit 39.0 - 52.0 % 21.8(L) 24.9(L) 18.6(L)  Platelets 150 - 400 K/uL 115(L) - 103(L)   CMP Latest Ref Rng & Units 03/18/2019 03/17/2019 03/16/2019  Glucose 70 - 99 mg/dL 100(H) 123(H) 106(H)  BUN 8 - 23 mg/dL 7(L) 11 17  Creatinine 0.61 - 1.24 mg/dL 0.84 0.94 0.82  Sodium 135 -  145 mmol/L 139 139 140  Potassium 3.5 - 5.1 mmol/L 3.7 3.8 3.8  Chloride 98 - 111 mmol/L 113(H) 111 113(H)  CO2 22 - 32 mmol/L 20(L) 22 20(L)  Calcium 8.9 - 10.3 mg/dL 7.6(L) 7.3(L) 7.2(L)  Total Protein 6.5 - 8.1 g/dL 4.0(L) 3.7(L) 3.9(L)  Total Bilirubin 0.3 - 1.2 mg/dL 1.2 1.1 1.6(H)  Alkaline Phos 38 - 126 U/L 56 43 37(L)  AST 15 - 41 U/L 58(H) 75(H) 80(H)  ALT 0 - 44 U/L 31 31 31       Assessment/Plan:  Principal Problem:   GI bleed Active Problems:   Hepatic lesion   NAGMA   Alcohol dependence (HCC)   AKI (acute kidney injury) ([place])   Summary: Gavin Arroyo is a 64 year old male with a past medical history of a perforated ulcer and significant alcohol usage who was admitted with a GI bleed determined to most likely be of diverticular origin by colonoscopy who continues to have normocytic anemia unresponsive to pRBC transfusions.   GI bleed, anemia: The patient had hematochezia on presentation that has since resolved. Colonoscopy/EGD determined the  source to most likely be of diverticular origin, but no active bleeding was found during the procedure. Despite no obvious source of active bleeding and transfusion of 6 units of pRBCs his hemoglobin and hematocrit continue to be low. At this point the leading etiology of his anemia is a active slow GI bleed that was unable to visualized on EGD/colonoscopy. Another potential explanation is a hemolytic anemia given increased reticulocyte count, however this is less likely given normal LDH values reticulocyte abnormalities could be a physiological response to recent and/or ongoing blood loss. A retroperitoneal bleed is another potential explanation, however abdominal CT a few days ago revealed no signs of this. Blood smear is in progress and will give more information to help narrow the differential.   - continue to monitor CBC - transfuse with pRBCs as needed - if he becomes hemodynamically unstable order STAT CT - consult GI and consider capsular EGD or tagged RBC study to determine if there is ongoing bleeding  Hepatic lesions: - follow up with outpatient MRI with and without contrast  - follow up with outpatient GI - follow LFTs   Alcohol dependence: - continue folate - continue thiamine - continue multivitamin   AKI: Has resolved, will continue to follow with CMP.      LOS: 4 days   Mikael Spray, Medical Student 03/18/2019, 10:38 AM

## 2019-03-18 NOTE — Plan of Care (Signed)

## 2019-03-19 ENCOUNTER — Encounter (HOSPITAL_COMMUNITY): Payer: Self-pay

## 2019-03-19 ENCOUNTER — Inpatient Hospital Stay (HOSPITAL_COMMUNITY)
Admission: EM | Admit: 2019-03-19 | Discharge: 2019-03-28 | DRG: 378 | Disposition: A | Discharge status: Home / Self Care | Payer: Self-pay | Attending: Student in an Organized Health Care Education/Training Program | Admitting: Student in an Organized Health Care Education/Training Program

## 2019-03-19 ENCOUNTER — Other Ambulatory Visit: Payer: Self-pay

## 2019-03-19 DIAGNOSIS — K922 Gastrointestinal hemorrhage, unspecified: Secondary | ICD-10-CM | POA: Diagnosis present

## 2019-03-19 DIAGNOSIS — K7460 Unspecified cirrhosis of liver: Secondary | ICD-10-CM | POA: Diagnosis present

## 2019-03-19 DIAGNOSIS — D125 Benign neoplasm of sigmoid colon: Secondary | ICD-10-CM | POA: Diagnosis present

## 2019-03-19 DIAGNOSIS — K7031 Alcoholic cirrhosis of liver with ascites: Secondary | ICD-10-CM | POA: Diagnosis present

## 2019-03-19 DIAGNOSIS — K766 Portal hypertension: Secondary | ICD-10-CM | POA: Diagnosis present

## 2019-03-19 DIAGNOSIS — E440 Moderate protein-calorie malnutrition: Secondary | ICD-10-CM | POA: Insufficient documentation

## 2019-03-19 DIAGNOSIS — D123 Benign neoplasm of transverse colon: Secondary | ICD-10-CM | POA: Diagnosis present

## 2019-03-19 DIAGNOSIS — K633 Ulcer of intestine: Secondary | ICD-10-CM | POA: Diagnosis present

## 2019-03-19 DIAGNOSIS — D62 Acute posthemorrhagic anemia: Secondary | ICD-10-CM | POA: Diagnosis present

## 2019-03-19 DIAGNOSIS — K5731 Diverticulosis of large intestine without perforation or abscess with bleeding: Principal | ICD-10-CM | POA: Diagnosis present

## 2019-03-19 DIAGNOSIS — K439 Ventral hernia without obstruction or gangrene: Secondary | ICD-10-CM | POA: Diagnosis present

## 2019-03-19 DIAGNOSIS — Q438 Other specified congenital malformations of intestine: Secondary | ICD-10-CM

## 2019-03-19 DIAGNOSIS — Z20822 Contact with and (suspected) exposure to covid-19: Secondary | ICD-10-CM | POA: Diagnosis present

## 2019-03-19 DIAGNOSIS — F17210 Nicotine dependence, cigarettes, uncomplicated: Secondary | ICD-10-CM | POA: Diagnosis present

## 2019-03-19 DIAGNOSIS — F1010 Alcohol abuse, uncomplicated: Secondary | ICD-10-CM | POA: Diagnosis present

## 2019-03-19 LAB — CULTURE, BLOOD (ROUTINE X 2)
Culture: NO GROWTH
Culture: NO GROWTH
Special Requests: ADEQUATE

## 2019-03-19 LAB — IRON AND TIBC
Iron: 23 ug/dL — ABNORMAL LOW (ref 45–182)
Saturation Ratios: 10 % — ABNORMAL LOW (ref 17.9–39.5)
TIBC: 241 ug/dL — ABNORMAL LOW (ref 250–450)
UIBC: 218 ug/dL

## 2019-03-19 LAB — COMPREHENSIVE METABOLIC PANEL
ALT: 26 U/L (ref 0–44)
AST: 54 U/L — ABNORMAL HIGH (ref 15–41)
Albumin: 1.8 g/dL — ABNORMAL LOW (ref 3.5–5.0)
Alkaline Phosphatase: 58 U/L (ref 38–126)
Anion gap: 10 (ref 5–15)
BUN: 15 mg/dL (ref 8–23)
CO2: 16 mmol/L — ABNORMAL LOW (ref 22–32)
Calcium: 7.5 mg/dL — ABNORMAL LOW (ref 8.9–10.3)
Chloride: 112 mmol/L — ABNORMAL HIGH (ref 98–111)
Creatinine, Ser: 1.08 mg/dL (ref 0.61–1.24)
GFR calc Af Amer: >60 mL/min (ref >60)
GFR calc non Af Amer: >60 mL/min (ref >60)
Glucose, Bld: 124 mg/dL — ABNORMAL HIGH (ref 70–99)
Potassium: 4.2 mmol/L (ref 3.5–5.1)
Sodium: 138 mmol/L (ref 135–145)
Total Bilirubin: 0.9 mg/dL (ref 0.3–1.2)
Total Protein: 3.8 g/dL — ABNORMAL LOW (ref 6.5–8.1)

## 2019-03-19 LAB — CBC
HCT: 16.4 % — ABNORMAL LOW (ref 39.0–52.0)
Hemoglobin: 5 g/dL — CL (ref 13.0–17.0)
MCH: 32.1 pg (ref 26.0–34.0)
MCHC: 30.5 g/dL (ref 30.0–36.0)
MCV: 105.1 fL — ABNORMAL HIGH (ref 80.0–100.0)
Platelets: 170 10*3/uL (ref 150–400)
RBC: 1.56 MIL/uL — ABNORMAL LOW (ref 4.22–5.81)
RDW: 19.3 % — ABNORMAL HIGH (ref 11.5–15.5)
WBC: 8.9 10*3/uL (ref 4.0–10.5)
nRBC: 0 % (ref 0.0–0.2)

## 2019-03-19 LAB — RESPIRATORY PANEL BY RT PCR (FLU A&B, COVID)
Influenza A by PCR: NEGATIVE
Influenza B by PCR: NEGATIVE
SARS Coronavirus 2 by RT PCR: NEGATIVE

## 2019-03-19 LAB — PREPARE RBC (CROSSMATCH)

## 2019-03-19 LAB — PROTIME-INR
INR: 1.5 — ABNORMAL HIGH (ref 0.8–1.2)
Prothrombin Time: 18.2 seconds — ABNORMAL HIGH (ref 11.4–15.2)

## 2019-03-19 LAB — TYPE AND SCREEN
ABO/RH(D): A POS
Antibody Screen: NEGATIVE

## 2019-03-19 LAB — POC OCCULT BLOOD, ED: Fecal Occult Bld: POSITIVE — AB

## 2019-03-19 LAB — FERRITIN: Ferritin: 68 ng/mL (ref 24–336)

## 2019-03-19 LAB — HAPTOGLOBIN: Haptoglobin: 89 mg/dL (ref 32–363)

## 2019-03-19 MED ORDER — PANTOPRAZOLE SODIUM 40 MG IV SOLR
40.0000 mg | Freq: Once | INTRAVENOUS | Status: AC
  Administered 2019-03-19: 40 mg via INTRAVENOUS
  Filled 2019-03-19: qty 40

## 2019-03-19 MED ORDER — PEG 3350-KCL-NA BICARB-NACL 420 G PO SOLR
4000.0000 mL | Freq: Once | ORAL | Status: AC
  Administered 2019-03-19: 4000 mL via ORAL
  Filled 2019-03-19: qty 4000

## 2019-03-19 MED ORDER — SODIUM CHLORIDE 0.9% FLUSH
3.0000 mL | Freq: Two times a day (BID) | INTRAVENOUS | Status: DC
  Administered 2019-03-19 – 2019-03-28 (×17): 3 mL via INTRAVENOUS

## 2019-03-19 MED ORDER — ONDANSETRON HCL 4 MG/2ML IJ SOLN: 4.0000 mg | Freq: Four times a day (QID) | INTRAMUSCULAR | Status: DC | PRN

## 2019-03-19 MED ORDER — FOLIC ACID 1 MG PO TABS
1.0000 mg | ORAL_TABLET | Freq: Every day | ORAL | Status: DC
  Administered 2019-03-21 – 2019-03-28 (×8): 1 mg via ORAL
  Filled 2019-03-19 (×8): qty 1

## 2019-03-19 MED ORDER — THIAMINE HCL 100 MG PO TABS
100.0000 mg | ORAL_TABLET | Freq: Every day | ORAL | Status: DC
  Administered 2019-03-19 – 2019-03-28 (×9): 100 mg via ORAL
  Filled 2019-03-19 (×9): qty 1

## 2019-03-19 MED ORDER — ONDANSETRON HCL 4 MG PO TABS: 4.0000 mg | ORAL_TABLET | Freq: Four times a day (QID) | ORAL | Status: DC | PRN

## 2019-03-19 MED ORDER — SODIUM CHLORIDE 0.9 % IV BOLUS
1000.0000 mL | Freq: Once | INTRAVENOUS | Status: AC
  Administered 2019-03-19: 1000 mL via INTRAVENOUS

## 2019-03-19 MED ORDER — PANTOPRAZOLE SODIUM 40 MG IV SOLR
40.0000 mg | Freq: Two times a day (BID) | INTRAVENOUS | Status: DC
  Administered 2019-03-19 – 2019-03-28 (×18): 40 mg via INTRAVENOUS
  Filled 2019-03-19 (×18): qty 40

## 2019-03-19 MED ORDER — SODIUM CHLORIDE 0.9% IV SOLUTION: Freq: Once | INTRAVENOUS | Status: AC

## 2019-03-19 NOTE — H&P (Signed)
NAME:  Gavin Arroyo, MRN:  [id number], DOB:  12-24-1955, LOS: 0 ADMISSION DATE:  03/19/2019, Primary: Patient, No Pcp Per  CHIEF COMPLAINT:  Melena  Medical Service: Internal Medicine Teaching Service         Attending Physician: Dr. Velna Ochs, MD    First Contact: Dr. Marianna Payment Pager: [phone]  Second Contact: Dr. 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 Over the course of his hospitalization and despite receiving several units of blood, hgb continued to drop. Bleed was believed to be diverticular in nature. On day of discharge, chart review indicates hgb drop from 8.2>7.1 however patient had reported decreased dark stools and a repeat hgb later on the day of discharge was stable at 7.3.  He presents to the ED today for 4 episodes of melena since discharge home yesterday. He has additionally developed lightheadedness and fatigue during that time. He denies abdominal pain, n/v, hematemesis, diarrhea or constipation. Denies any other blood loss. No pain associated with the bowel movements. Denies fevers or chills.  Only food he ate since hospital discharge was a peach cobbler. Denies alcohol use since discharge.  Hgb noted to be 5 in the ED. Received 2U PRBC. Seen by GI in the ED who are planning to take patient for colonoscopy and possibly EGD.  Past Medical History  GI bleed Liver cirrhosis Esophageal varicies Portal hypertensive gastropathy Abdominal hernia Alcohol use disorder   Home Medications     Prior to Admission medications   Medication  Sig Start Date End Date Taking? Authorizing Provider  HYDROcodone-acetaminophen (NORCO/VICODIN) 5-325 MG per tablet Take 1 tablet by mouth every 6 (six) hours as needed. 05/23/14   [name], MD    Allergies    Allergies as of 03/19/2019  . (No Known Allergies)    Social History   reports that he has been smoking. He has never used smokeless tobacco. He reports current alcohol use of about 1.0 standard drinks of alcohol per week. He reports current drug use. Drug: Cocaine.   Family History   His family history is not on file.   ROS  Review of Systems  Constitutional: Positive for malaise/fatigue. Negative for fever.  HENT: Negative.   Respiratory: Positive for shortness of breath.   Cardiovascular: Negative.   Gastrointestinal: Positive for melena. Negative for abdominal pain, blood in stool, diarrhea, nausea and vomiting.  Genitourinary: Negative.   Musculoskeletal: Negative.   Neurological: Positive for dizziness and weakness. Negative for loss of consciousness.  Psychiatric/Behavioral: Negative.     Objective   Blood pressure (!) 100/48, pulse 88, temperature 98.2 F (36.8 C), temperature source Oral, resp. rate 16, height 5\' 10"  (1.778 m), weight 71.8 kg, SpO2 98 %.    Filed Weights   03/19/19 1443  Weight: 71.8 kg    Examination: GENERAL: in no acute distress HEENT: airway patent CARDIAC: heart RRR.  PULMONARY: Lung sounds clear to auscultation. ABDOMEN: soft. Nontender to palpation.  Periumbilical hernia present with bowel sounds present. NEURO: CN II-XII grossly intact. SKIN: no rash or lesions on limited exam  PSYCH: Normal affect   Significant Diagnostic Tests:  EKG: SR   Labs    CBC Latest Ref Rng & Units 03/19/2019 03/18/2019 03/18/2019  WBC 4.0 - 10.5 K/uL 8.9 5.8 5.6  Hemoglobin 13.0 - 17.0 g/dL 5.0(LL) 7.3(L) 7.1(L)  Hematocrit 39.0 - 52.0 % 16.4(L) 21.9(L) 21.8(L)  Platelets 150 - 400 K/uL 170 133(L) 115(L)   BMP Latest Ref Rng & Units  03/19/2019 03/18/2019 03/17/2019  Glucose 70 - 99 mg/dL 124(H) 100(H) 123(H)  BUN 8 - 23 mg/dL 15 7(L) 11  Creatinine 0.61 - 1.24 mg/dL 1.08 0.84 0.94  Sodium 135 - 145 mmol/L 138 139 139  Potassium 3.5 - 5.1 mmol/L 4.2 3.7 3.8  Chloride 98 - 111 mmol/L 112(H) 113(H) 111  CO2 22 - 32 mmol/L 16(L) 20(L) 22  Calcium 8.9 - 10.3 mg/dL 7.5(L) 7.6(L) 7.3(L)     Summary  64 yo male presenting with persistent melena and iron deficient blood loss anemia due to GI bleeding.  Assessment & Plan:  Active Problems:   GI bleed   Acute blood loss anemia  Acute symptomatic, macrocytic, iron deficient blood loss anemia due to GI bleeding. Hgb 5.0 on arrival. On chart review, looks like hgb had continued to trend down over his hospitalization however patient had reported improvement in his GI bleeding on day of discharge. On admission today, he is reporting 4 episodes of melena since returning home yesterday resulting in lightheadedness and weakness. Received 2U PRBC in ED. GI consulted in ED. Thinking polypectomy sites, from prior colonoscopy last admission, as likely source of bleeding. Plan Post transfusion h/h GI planning for colonoscopy and possible EGD in am. Clear liquid diet tonight. NPO at MN. 40mg  IV protonix BID Tele monitoring  Cirrhosis. Complicated by esophag varcies, portal hypertensive gastropathy. EGD on prior admission showed trace varcies. Does not appear to be decompensated at this point. Platelets 170. INR only mildly elevated at 1.5.  Alcohol use disorder. Chart review indicates alcohol intake of approx 1 pint/day. Will place on CIWA   Best practice:  CODE STATUS: Full Diet: clear liquid; NPO at MN Pain management: takes norco at home GI prophylaxis: 40mg  IV protonix bid DVT for prophylaxis: SCD Social considerations/Family communication:  Dispo: Admit patient to Observation with expected length of stay less than 2 midnights.  Mitzi Hansen, MD INTERNAL MEDICINE RESIDENT  PGY-1 Pager # [phone] 03/19/19  5:40 PM

## 2019-03-19 NOTE — ED Notes (Signed)
Attempted report floor RN unavailable.

## 2019-03-19 NOTE — Consult Note (Signed)
[facility] Consult  Referring Provider: Franchot Heidelberg, PA-C/ER Primary Care Physician:  Patient, No Pcp Per Primary Gastroenterologist: Althia Forts, recently seen as an inpatient  Reason for Consultation: Rectal bleeding  HPI: Gavin Arroyo is a 64 y.o. 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  Patient states he started having bloody bowel movements last night.  He describes the blood as dark red, almost black.  He has had 4 loose bowel movements since last night.  He denies abdominal pain.  He had mild nausea this morning but has not had any episodes of emesis.  He also started feeling weak and dizzy today but denies any syncopal episodes.  Denies any use of NSAIDs since discharge.   Past Medical History:  Diagnosis Date  . GI bleeding 03/2019    Past Surgical History:  Procedure Laterality Date  . COLONOSCOPY WITH PROPOFOL N/A 03/16/2019   Procedure: COLONOSCOPY WITH PROPOFOL;  Surgeon: Ronnette Juniper, MD;  Location: [facility];  Service: Gastroenterology;  Laterality: N/A;  . ESOPHAGOGASTRODUODENOSCOPY (EGD) WITH PROPOFOL N/A 03/16/2019   Procedure: ESOPHAGOGASTRODUODENOSCOPY (EGD) WITH PROPOFOL;  Surgeon: Ronnette Juniper, MD;  Location: [place];  Service: Gastroenterology;  Laterality: N/A;  . POLYPECTOMY  03/16/2019   Procedure: POLYPECTOMY;  Surgeon: Ronnette Juniper, MD;  Location: [facility] ENDOSCOPY;  Service: Gastroenterology;;  . REPAIR OF PERFORATED ULCER  2010  ?    Prior to Admission medications   Medication Sig Start Date End Date Taking? Authorizing Provider  HYDROcodone-acetaminophen (NORCO/VICODIN) 5-325 MG per tablet Take 1 tablet by mouth every 6 (six) hours as needed. 05/23/14   Horton, Barbette Hair, MD    Current Facility-Administered Medications  Medication Dose Route  Frequency Provider Last Rate Last Admin  . 0.9 %  sodium chloride infusion (Manually program via Guardrails IV Fluids)   Intravenous Once Nadeen Landau, MD      . pantoprazole (PROTONIX) injection 40 mg  40 mg Intravenous Once Caccavale, Sophia, PA-C      . pantoprazole (PROTONIX) injection 40 mg  40 mg Intravenous Q12H Ronnette Juniper, MD      . polyethylene glycol-electrolytes (NuLYTELY) solution 4,000 mL  4,000 mL Oral Once Ronnette Juniper, MD       Current Outpatient Medications  Medication Sig Dispense Refill  . HYDROcodone-acetaminophen (NORCO/VICODIN) 5-325 MG per tablet Take 1 tablet by mouth every 6 (six) hours as needed. 10 tablet 0    Allergies as of 03/19/2019  . (No Known Allergies)    No family history on file.  Social History   Socioeconomic History  . Marital status: Single    Spouse name: Not on file  . Number of children: Not on file  . Years of education: Not on file  . Highest education level: Not on file  Occupational History  . Not on file  Tobacco Use  . Smoking status: Current Some Day Smoker  . Smokeless tobacco: Never Used  Substance and Sexual Activity  . Alcohol use: Yes    Alcohol/week: 1.0 standard drinks    Types: 1 Shots of liquor per week    Comment: liquor every day , one gallon a day   . Drug use: Yes    Types: Cocaine    Comment: every day  . Sexual activity: Not on file  Other Topics Concern  . Not on file  Social History Narrative  . Not on file   Social Determinants of Health   Financial Resource Strain:   . Difficulty of Paying Living Expenses:   Food Insecurity:   . Worried About Charity fundraiser in the Last Year:   . Arboriculturist in the Last Year:   Transportation Needs:   . Film/video editor (Medical):   Marland Kitchen Lack of Transportation (Non-Medical):   Physical Activity:   . Days of Exercise per Week:   . Minutes of Exercise per Session:   Stress:   . Feeling of Stress :   Social Connections:   . Frequency of  Communication with Friends and Family:   . Frequency of Social Gatherings with Friends and Family:   . Attends Religious Services:   . Active Member of Clubs or Organizations:   . Attends Archivist Meetings:   Marland Kitchen Marital Status:   Intimate Partner Violence:   . Fear of Current or Ex-Partner:   . Emotionally Abused:   Marland Kitchen Physically Abused:   . Sexually Abused:     Review of Systems: As noted in HPI.  Physical Exam: Vital signs in last 24 hours: Temp:  [98.9 F (37.2 C)] 98.9 F (37.2 C) (03/11 1330) Pulse Rate:  [81-101] 81 (03/11 1445) Resp:  [15-16] 15 (03/11 1445) BP: (100-103)/(47-48) 100/48 (03/11 1445) SpO2:  [95 %-98 %] 98 % (03/11 1445) Weight:  [71.8 kg] 71.8 kg (03/11 1443)    General:   Lying in bed, pleasant and cooperative in NAD Head:  Normocephalic and atraumatic. Eyes:  Sclera clear, no icterus.   Conjunctival pallor. Ears:  Normal auditory acuity. Nose:  No deformity, discharge,  or lesions. Lungs:  Clear throughout to auscultation.   No wheezes, crackles, or rhonchi. No acute distress. Heart:  Regular rate and rhythm; no murmurs, clicks, rubs,  or gallops. Extremities:  Without clubbing or edema. Neurologic:  Alert and  oriented x4;  grossly normal neurologically. Skin:  Intact without significant lesions or rashes. Psych:  Alert and cooperative. Normal mood and affect. Abdomen: Large ventral hernia.  Mild epigastric and left upper quadrant tenderness.  Nondistended, normoactive bowel sounds, no rebound or guarding.  Lab Results: Recent Labs    03/18/19 0507 03/18/19 1340 03/19/19 1529  WBC 5.6 5.8 8.9  HGB 7.1* 7.3* 5.0*  HCT 21.8* 21.9* 16.4*  PLT 115* 133* 170   BMET Recent Labs    03/17/19 0327 03/18/19 0507 03/19/19 1529  NA 139 139 138  K 3.8 3.7 4.2  CL 111 113* 112*  CO2 22 20* 16*  GLUCOSE 123* 100* 124*  BUN 11 7* 15  CREATININE 0.94 0.84 1.08  CALCIUM 7.3* 7.6* 7.5*   LFT Recent Labs    03/19/19 1529  PROT 3.8*   ALBUMIN 1.8*  AST 54*  ALT 26  ALKPHOS 58  BILITOT 0.9   PT/INR Recent Labs    03/17/19 0327 03/18/19 0333  LABPROT 17.2* 17.2*  INR 1.4* 1.4*    Studies/Results: No results found.  Impression: Rectal bleeding. Large polyps removed during recent colonoscopy, possible post polypectomy bleeding.   Decompensated alcoholic cirrhosis.  Trace varices on recent EGD.  Plan: Colonoscopy tomorrow.  Possible EGD if colonoscopy is negative.  PPI twice daily.  Clear liquid diet with n.p.o. status after midnight.  Patient to be transfused 2u pRBCs due to hemoglobin of 5.0 on arrival.  Continue to monitor H&H, transfuse if hemoglobin is less than 7.   LOS:  0 days   Ronnette Juniper, MD  03/19/2019, 4:42 PM

## 2019-03-19 NOTE — H&P (View-Only) (Signed)
[facility] Consult  Referring Provider: Franchot Heidelberg, PA-C/ER Primary Care Physician:  Patient, No Pcp Per Primary Gastroenterologist: Althia Forts, recently seen as an inpatient  Reason for Consultation: Rectal bleeding  HPI: Gavin Arroyo is a 64 y.o. 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  Patient states he started having bloody bowel movements last night.  He describes the blood as dark red, almost black.  He has had 4 loose bowel movements since last night.  He denies abdominal pain.  He had mild nausea this morning but has not had any episodes of emesis.  He also started feeling weak and dizzy today but denies any syncopal episodes.  Denies any use of NSAIDs since discharge.   Past Medical History:  Diagnosis Date  . GI bleeding 03/2019    Past Surgical History:  Procedure Laterality Date  . COLONOSCOPY WITH PROPOFOL N/A 03/16/2019   Procedure: COLONOSCOPY WITH PROPOFOL;  Surgeon: Ronnette Juniper, MD;  Location: [place];  Service: Gastroenterology;  Laterality: N/A;  . ESOPHAGOGASTRODUODENOSCOPY (EGD) WITH PROPOFOL N/A 03/16/2019   Procedure: ESOPHAGOGASTRODUODENOSCOPY (EGD) WITH PROPOFOL;  Surgeon: Ronnette Juniper, MD;  Location: [facility];  Service: Gastroenterology;  Laterality: N/A;  . POLYPECTOMY  03/16/2019   Procedure: POLYPECTOMY;  Surgeon: Ronnette Juniper, MD;  Location: [facility] ENDOSCOPY;  Service: Gastroenterology;;  . REPAIR OF PERFORATED ULCER  2010  ?    Prior to Admission medications   Medication Sig Start Date End Date Taking? Authorizing Provider  HYDROcodone-acetaminophen (NORCO/VICODIN) 5-325 MG per tablet Take 1 tablet by mouth every 6 (six) hours as needed. 05/23/14   Horton, Barbette Hair, MD    Current Facility-Administered Medications  Medication Dose Route  Frequency Provider Last Rate Last Admin  . 0.9 %  sodium chloride infusion (Manually program via Guardrails IV Fluids)   Intravenous Once Nadeen Landau, MD      . pantoprazole (PROTONIX) injection 40 mg  40 mg Intravenous Once Caccavale, Sophia, PA-C      . pantoprazole (PROTONIX) injection 40 mg  40 mg Intravenous Q12H Ronnette Juniper, MD      . polyethylene glycol-electrolytes (NuLYTELY) solution 4,000 mL  4,000 mL Oral Once Ronnette Juniper, MD       Current Outpatient Medications  Medication Sig Dispense Refill  . HYDROcodone-acetaminophen (NORCO/VICODIN) 5-325 MG per tablet Take 1 tablet by mouth every 6 (six) hours as needed. 10 tablet 0    Allergies as of 03/19/2019  . (No Known Allergies)    No family history on file.  Social History   Socioeconomic History  . Marital status: Single    Spouse name: Not on file  . Number of children: Not on file  . Years of education: Not on file  . Highest education level: Not on file  Occupational History  . Not on file  Tobacco Use  . Smoking status: Current Some Day Smoker  . Smokeless tobacco: Never Used  Substance and Sexual Activity  . Alcohol use: Yes    Alcohol/week: 1.0 standard drinks    Types: 1 Shots of liquor per week    Comment: liquor every day , one gallon a day   . Drug use: Yes    Types: Cocaine    Comment: every day  . Sexual activity: Not on file  Other Topics Concern  . Not on file  Social History Narrative  . Not on file   Social Determinants of Health   Financial Resource Strain:   . Difficulty of Paying Living Expenses:   Food Insecurity:   . Worried About Charity fundraiser in the Last Year:   . Arboriculturist in the Last Year:   Transportation Needs:   . Film/video editor (Medical):   Marland Kitchen Lack of Transportation (Non-Medical):   Physical Activity:   . Days of Exercise per Week:   . Minutes of Exercise per Session:   Stress:   . Feeling of Stress :   Social Connections:   . Frequency of  Communication with Friends and Family:   . Frequency of Social Gatherings with Friends and Family:   . Attends Religious Services:   . Active Member of Clubs or Organizations:   . Attends Archivist Meetings:   Marland Kitchen Marital Status:   Intimate Partner Violence:   . Fear of Current or Ex-Partner:   . Emotionally Abused:   Marland Kitchen Physically Abused:   . Sexually Abused:     Review of Systems: As noted in HPI.  Physical Exam: Vital signs in last 24 hours: Temp:  [98.9 F (37.2 C)] 98.9 F (37.2 C) (03/11 1330) Pulse Rate:  [81-101] 81 (03/11 1445) Resp:  [15-16] 15 (03/11 1445) BP: (100-103)/(47-48) 100/48 (03/11 1445) SpO2:  [95 %-98 %] 98 % (03/11 1445) Weight:  [71.8 kg] 71.8 kg (03/11 1443)    General:   Lying in bed, pleasant and cooperative in NAD Head:  Normocephalic and atraumatic. Eyes:  Sclera clear, no icterus.   Conjunctival pallor. Ears:  Normal auditory acuity. Nose:  No deformity, discharge,  or lesions. Lungs:  Clear throughout to auscultation.   No wheezes, crackles, or rhonchi. No acute distress. Heart:  Regular rate and rhythm; no murmurs, clicks, rubs,  or gallops. Extremities:  Without clubbing or edema. Neurologic:  Alert and  oriented x4;  grossly normal neurologically. Skin:  Intact without significant lesions or rashes. Psych:  Alert and cooperative. Normal mood and affect. Abdomen: Large ventral hernia.  Mild epigastric and left upper quadrant tenderness.  Nondistended, normoactive bowel sounds, no rebound or guarding.  Lab Results: Recent Labs    03/18/19 0507 03/18/19 1340 03/19/19 1529  WBC 5.6 5.8 8.9  HGB 7.1* 7.3* 5.0*  HCT 21.8* 21.9* 16.4*  PLT 115* 133* 170   BMET Recent Labs    03/17/19 0327 03/18/19 0507 03/19/19 1529  NA 139 139 138  K 3.8 3.7 4.2  CL 111 113* 112*  CO2 22 20* 16*  GLUCOSE 123* 100* 124*  BUN 11 7* 15  CREATININE 0.94 0.84 1.08  CALCIUM 7.3* 7.6* 7.5*   LFT Recent Labs    03/19/19 1529  PROT 3.8*   ALBUMIN 1.8*  AST 54*  ALT 26  ALKPHOS 58  BILITOT 0.9   PT/INR Recent Labs    03/17/19 0327 03/18/19 0333  LABPROT 17.2* 17.2*  INR 1.4* 1.4*    Studies/Results: No results found.  Impression: Rectal bleeding. Large polyps removed during recent colonoscopy, possible post polypectomy bleeding.   Decompensated alcoholic cirrhosis.  Trace varices on recent EGD.  Plan: Colonoscopy tomorrow.  Possible EGD if colonoscopy is negative.  PPI twice daily.  Clear liquid diet with n.p.o. status after midnight.  Patient to be transfused 2u pRBCs due to hemoglobin of 5.0 on arrival.  Continue to monitor H&H, transfuse if hemoglobin is less than 7.   LOS:  0 days   Ronnette Juniper, MD  03/19/2019, 4:42 PM

## 2019-03-19 NOTE — ED Triage Notes (Signed)
Pt returns today for dark red blood in his stool last night after getting home from being discharged from upstairs. Pt received 4 units of blood during admission and had a colonoscopy done but does not know the results. Pt c.o abd pain and feeling dizzy. Pt pale and hypotensive in triage. Alert and oriented at this time.

## 2019-03-19 NOTE — ED Provider Notes (Signed)
  Physical Exam  BP (!) 100/48   Pulse 81   Temp 98.9 F (37.2 C) (Oral)   Resp 15   Ht 5\' 10"  (1.778 m)   Wt 71.8 kg   SpO2 98%   BMI 22.71 kg/m   Physical Exam Vitals and nursing note reviewed.  Constitutional:      Appearance: He is well-developed.  HENT:     Head: Normocephalic and atraumatic.  Eyes:     Conjunctiva/sclera: Conjunctivae normal.  Cardiovascular:     Rate and Rhythm: Normal rate and regular rhythm.     Heart sounds: No murmur.  Pulmonary:     Effort: Pulmonary effort is normal. No respiratory distress.     Breath sounds: Normal breath sounds.  Abdominal:     Palpations: Abdomen is soft.     Tenderness: There is no abdominal tenderness.  Musculoskeletal:     Cervical back: Neck supple.  Skin:    General: Skin is warm and dry.     Coloration: Skin is pale.  Neurological:     Mental Status: He is alert.     ED Course/Procedures     Procedures  MDM  Assumed care from offgoing provider at 1500. In brief, pt presenting with dark stool and lightheadedness. He has a hx of GI bleed with recent discharge yesterday. During his admission, he underwent colonoscopy which revealed diverticulosis in sigmoid and descending colon with multiple polyps removed. EGD showed grade 1 esophageal varices. Pt's BP has been soft in the ED with HR between 80-100. At handoff, waiting for CBC. Pt given Protonix in the ED.   CBC with Hgb 5.0. Prepared 2units RBC's. Pt admitted to Internal Medicine Service. For details on hospital course following admission, please refer to inpt team's note. Pt in agreement with plan. No further w/u or intervention provided while in ED. Pt stable at time of admission.   Pt assessed and evaluated with Dr. Tyrone Nine.  Nadeen Landau, MD     Nadeen Landau, MD 03/20/19 [place], [place], DO 03/20/19 [id number]

## 2019-03-19 NOTE — ED Provider Notes (Signed)
[facility] EMERGENCY DEPARTMENT Provider Note   CSN: [id number] Arrival date & time: 03/19/19  1324     History Chief Complaint  Patient presents with  . GI Bleeding    Gavin Arroyo is a 64 y.o. male presenting for evaluation of blood in stool and weakness.  Patient states he was just admitted for the same.  He was discharged last night, was feeling better.  When he woke up today, he had multiple dark bloody stools.  Since then, he has been feeling weak and lightheaded.  Symptoms are constant, worse when he goes from sitting to standing.  He denies fevers, chills, chest, shortness breath, cough, nausea, vomiting, new abdominal pain, or urinary symptoms.  He reports smoking 1 to 2 cigarettes a day, drinks a pint of alcohol a day, denies drug use.  He has not had alcohol today or yesterday. States he does not take any medications daily including PPI or H2 blocker.    Additional history obtained from chart review.  Reviewed recent hospitalization.  Patient had a colonoscopy which showed diverticula without infection or bleed.  Multiple polyps were removed.  Showed gastritis without perforation.   HPI     Past Medical History:  Diagnosis Date  . GI bleeding 03/2019    Patient Active Problem List   Diagnosis Date Noted  . Symptomatic anemia   . Hepatic lesion 03/14/2019  . GI bleed 03/14/2019  . NAGMA 03/14/2019  . Alcohol use disorder, moderate, dependence ([place]) 03/14/2019  . AKI (acute kidney injury) ([place]) 03/14/2019    Past Surgical History:  Procedure Laterality Date  . COLONOSCOPY WITH PROPOFOL N/A 03/16/2019   Procedure: COLONOSCOPY WITH PROPOFOL;  Surgeon: Ronnette Juniper, MD;  Location: [place];  Service: Gastroenterology;  Laterality: N/A;  . ESOPHAGOGASTRODUODENOSCOPY (EGD) WITH PROPOFOL N/A 03/16/2019   Procedure: ESOPHAGOGASTRODUODENOSCOPY (EGD) WITH PROPOFOL;  Surgeon: Ronnette Juniper, MD;  Location: [place];  Service: Gastroenterology;   Laterality: N/A;  . POLYPECTOMY  03/16/2019   Procedure: POLYPECTOMY;  Surgeon: Ronnette Juniper, MD;  Location: [facility] ENDOSCOPY;  Service: Gastroenterology;;  . REPAIR OF PERFORATED ULCER  2010  ?       No family history on file.  Social History   Tobacco Use  . Smoking status: Current Some Day Smoker  . Smokeless tobacco: Never Used  Substance Use Topics  . Alcohol use: Yes    Alcohol/week: 1.0 standard drinks    Types: 1 Shots of liquor per week    Comment: liquor every day , one gallon a day   . Drug use: Yes    Types: Cocaine    Comment: every day    Home Medications Prior to Admission medications   Medication Sig Start Date End Date Taking? Authorizing Provider  HYDROcodone-acetaminophen (NORCO/VICODIN) 5-325 MG per tablet Take 1 tablet by mouth every 6 (six) hours as needed. 05/23/14   Horton, Barbette Hair, MD    Allergies    Patient has no known allergies.  Review of Systems   Review of Systems  Gastrointestinal: Positive for blood in stool.  Neurological: Positive for weakness and light-headedness.  All other systems reviewed and are negative.   Physical Exam Updated Vital Signs BP (!) 100/48   Pulse 81   Temp 98.9 F (37.2 C) (Oral)   Resp 15   Ht 5\' 10"  (1.778 m)   Wt 71.8 kg   SpO2 98%   BMI 22.71 kg/m   Physical Exam Vitals and nursing note reviewed. Exam conducted  with a chaperone present.  Constitutional:      Appearance: He is well-developed. He is ill-appearing and diaphoretic.     Comments: Patient appears pale and ill.  HENT:     Head: Normocephalic and atraumatic.  Eyes:     Extraocular Movements: Extraocular movements intact.     Conjunctiva/sclera: Conjunctivae normal.     Pupils: Pupils are equal, round, and reactive to light.  Cardiovascular:     Rate and Rhythm: Normal rate and regular rhythm.     Pulses: Normal pulses.  Pulmonary:     Effort: Pulmonary effort is normal. No respiratory distress.     Breath sounds: Normal breath sounds.  No wheezing.  Abdominal:     General: There is no distension.     Palpations: Abdomen is soft. There is no mass.     Tenderness: There is no abdominal tenderness. There is no guarding or rebound.     Hernia: A hernia is present.     Comments: Large ventral hernia which is soft, nonerythematous, nontender.  No tenderness palpation elsewhere in the abdomen.  No rigidity, guarding or distention.  Genitourinary:    Rectum: Guaiac result positive.     Comments: Gross blood coming from rectum.  No clots.  Blood is maroon/red, not black Musculoskeletal:        General: Normal range of motion.     Cervical back: Normal range of motion and neck supple.  Skin:    General: Skin is warm.     Capillary Refill: Capillary refill takes less than 2 seconds.  Neurological:     Mental Status: He is alert and oriented to person, place, and time.     ED Results / Procedures / Treatments   Labs (all labs ordered are listed, but only abnormal results are displayed) Labs Reviewed  POC OCCULT BLOOD, ED - Abnormal; Notable for the following components:      Result Value   Fecal Occult Bld POSITIVE (*)    All other components within normal limits  COMPREHENSIVE METABOLIC PANEL  CBC  TYPE AND SCREEN  TYPE AND SCREEN    EKG EKG Interpretation  Date/Time:  Thursday March 19 2019 15:12:50 EST Ventricular Rate:  86 PR Interval:    QRS Duration: 96 QT Interval:  389 QTC Calculation: 466 R Axis:   35 Text Interpretation: Sinus rhythm Low voltage, precordial leads When compared to prior, slower rate. No STEMI Confirmed by Antony Blackbird [phone]) on 03/19/2019 3:13:58 PM   Radiology No results found.  Procedures Procedures (including critical care time)  Medications Ordered in ED Medications  pantoprazole (PROTONIX) injection 40 mg (has no administration in time range)    ED Course  I have reviewed the triage vital signs and the nursing notes.  Pertinent labs & imaging results that were  available during my care of the patient were reviewed by me and considered in my medical decision making (see chart for details).    MDM Rules/Calculators/A&P                      Pt presenting for evaluation of rectal bleeding and weakness.  On exam, patient appears pale and ill.  Blood pressure is soft, initial heart rate elevated.  I am concerned for GI bleed causing anemia.  As he is having gross blood from his rectum, he will likely need to be admitted for hemoglobin trending, possible transfusion, and repeat GI eval.  Hemoccult positive.  Labs pending.  Patient  signed out to Jerrol Banana, MD for follow-up on labs and admission to the hospital.  Final Clinical Impression(s) / ED Diagnoses Final diagnoses:  None    Rx / DC Orders ED Discharge Orders    None       Franchot Heidelberg, PA-C 03/19/19 [place], [place], DO 03/19/19 [id number]

## 2019-03-20 ENCOUNTER — Observation Stay (HOSPITAL_COMMUNITY): Payer: Self-pay | Admitting: Anesthesiology

## 2019-03-20 ENCOUNTER — Other Ambulatory Visit: Payer: Self-pay

## 2019-03-20 ENCOUNTER — Encounter (HOSPITAL_COMMUNITY): Payer: Self-pay | Admitting: Internal Medicine

## 2019-03-20 ENCOUNTER — Encounter (HOSPITAL_COMMUNITY)
Admission: EM | Disposition: A | Discharge status: Home / Self Care | Payer: Self-pay | Source: Home / Self Care | Attending: Internal Medicine

## 2019-03-20 DIAGNOSIS — Z86010 Personal history of colonic polyps: Secondary | ICD-10-CM

## 2019-03-20 DIAGNOSIS — Z7289 Other problems related to lifestyle: Secondary | ICD-10-CM

## 2019-03-20 DIAGNOSIS — K3189 Other diseases of stomach and duodenum: Secondary | ICD-10-CM

## 2019-03-20 DIAGNOSIS — K5730 Diverticulosis of large intestine without perforation or abscess without bleeding: Secondary | ICD-10-CM

## 2019-03-20 DIAGNOSIS — D62 Acute posthemorrhagic anemia: Secondary | ICD-10-CM

## 2019-03-20 DIAGNOSIS — I8510 Secondary esophageal varices without bleeding: Secondary | ICD-10-CM

## 2019-03-20 DIAGNOSIS — K469 Unspecified abdominal hernia without obstruction or gangrene: Secondary | ICD-10-CM

## 2019-03-20 DIAGNOSIS — K7030 Alcoholic cirrhosis of liver without ascites: Secondary | ICD-10-CM

## 2019-03-20 DIAGNOSIS — Z9889 Other specified postprocedural states: Secondary | ICD-10-CM

## 2019-03-20 DIAGNOSIS — K922 Gastrointestinal hemorrhage, unspecified: Secondary | ICD-10-CM | POA: Diagnosis present

## 2019-03-20 DIAGNOSIS — K766 Portal hypertension: Secondary | ICD-10-CM

## 2019-03-20 HISTORY — PX: HEMOSTASIS CONTROL: SHX6838

## 2019-03-20 HISTORY — PX: COLONOSCOPY WITH PROPOFOL: SHX5780

## 2019-03-20 HISTORY — PX: HEMOSTASIS CLIP PLACEMENT: SHX6857

## 2019-03-20 HISTORY — PX: POLYPECTOMY: SHX5525

## 2019-03-20 LAB — CBC
HCT: 17.7 % — ABNORMAL LOW (ref 39.0–52.0)
Hemoglobin: 5.9 g/dL — CL (ref 13.0–17.0)
MCH: 31.2 pg (ref 26.0–34.0)
MCHC: 33.3 g/dL (ref 30.0–36.0)
MCV: 93.7 fL (ref 80.0–100.0)
Platelets: 133 10*3/uL — ABNORMAL LOW (ref 150–400)
RBC: 1.89 MIL/uL — ABNORMAL LOW (ref 4.22–5.81)
RDW: 17.8 % — ABNORMAL HIGH (ref 11.5–15.5)
WBC: 8.2 10*3/uL (ref 4.0–10.5)
nRBC: 0 % (ref 0.0–0.2)

## 2019-03-20 LAB — HEMOGLOBIN AND HEMATOCRIT, BLOOD
HCT: 19.6 % — ABNORMAL LOW (ref 39.0–52.0)
HCT: 22 % — ABNORMAL LOW (ref 39.0–52.0)
Hemoglobin: 6.6 g/dL — CL (ref 13.0–17.0)
Hemoglobin: 7.4 g/dL — ABNORMAL LOW (ref 13.0–17.0)

## 2019-03-20 LAB — COMPREHENSIVE METABOLIC PANEL
ALT: 23 U/L (ref 0–44)
AST: 39 U/L (ref 15–41)
Albumin: 1.6 g/dL — ABNORMAL LOW (ref 3.5–5.0)
Alkaline Phosphatase: 46 U/L (ref 38–126)
Anion gap: 7 (ref 5–15)
BUN: 14 mg/dL (ref 8–23)
CO2: 18 mmol/L — ABNORMAL LOW (ref 22–32)
Calcium: 7.3 mg/dL — ABNORMAL LOW (ref 8.9–10.3)
Chloride: 115 mmol/L — ABNORMAL HIGH (ref 98–111)
Creatinine, Ser: 0.96 mg/dL (ref 0.61–1.24)
GFR calc Af Amer: >60 mL/min (ref >60)
GFR calc non Af Amer: >60 mL/min (ref >60)
Glucose, Bld: 109 mg/dL — ABNORMAL HIGH (ref 70–99)
Potassium: 3.9 mmol/L (ref 3.5–5.1)
Sodium: 140 mmol/L (ref 135–145)
Total Bilirubin: 1.4 mg/dL — ABNORMAL HIGH (ref 0.3–1.2)
Total Protein: 3.5 g/dL — ABNORMAL LOW (ref 6.5–8.1)

## 2019-03-20 LAB — PROTIME-INR
INR: 1.6 — ABNORMAL HIGH (ref 0.8–1.2)
Prothrombin Time: 18.5 seconds — ABNORMAL HIGH (ref 11.4–15.2)

## 2019-03-20 LAB — APTT: aPTT: 29 seconds (ref 24–36)

## 2019-03-20 LAB — PREPARE RBC (CROSSMATCH)

## 2019-03-20 SURGERY — COLONOSCOPY WITH PROPOFOL
Anesthesia: Monitor Anesthesia Care

## 2019-03-20 MED ORDER — EPHEDRINE SULFATE-NACL 50-0.9 MG/10ML-% IV SOSY
PREFILLED_SYRINGE | INTRAVENOUS | Status: DC | PRN
  Administered 2019-03-20: 5 mg via INTRAVENOUS

## 2019-03-20 MED ORDER — PROPOFOL 10 MG/ML IV BOLUS
INTRAVENOUS | Status: DC | PRN
  Administered 2019-03-20: 20 mg via INTRAVENOUS
  Administered 2019-03-20: 30 mg via INTRAVENOUS

## 2019-03-20 MED ORDER — PHENYLEPHRINE 40 MCG/ML (10ML) SYRINGE FOR IV PUSH (FOR BLOOD PRESSURE SUPPORT)
PREFILLED_SYRINGE | INTRAVENOUS | Status: DC | PRN
  Administered 2019-03-20: 80 ug via INTRAVENOUS

## 2019-03-20 MED ORDER — SODIUM CHLORIDE 0.9 % IV SOLN: INTRAVENOUS | Status: DC

## 2019-03-20 MED ORDER — LACTATED RINGERS IV BOLUS
1000.0000 mL | Freq: Once | INTRAVENOUS | Status: AC
  Administered 2019-03-20: 1000 mL via INTRAVENOUS

## 2019-03-20 MED ORDER — PROPOFOL 500 MG/50ML IV EMUL
INTRAVENOUS | Status: DC | PRN
  Administered 2019-03-20: 75 ug/kg/min via INTRAVENOUS

## 2019-03-20 MED ORDER — SODIUM CHLORIDE 0.9% IV SOLUTION: Freq: Once | INTRAVENOUS | Status: AC

## 2019-03-20 MED ORDER — LACTATED RINGERS IV SOLN: INTRAVENOUS | Status: DC

## 2019-03-20 MED ORDER — LIDOCAINE 2% (20 MG/ML) 5 ML SYRINGE
INTRAMUSCULAR | Status: DC | PRN
  Administered 2019-03-20: 60 mg via INTRAVENOUS

## 2019-03-20 SURGICAL SUPPLY — 22 items

## 2019-03-20 NOTE — Plan of Care (Signed)
  Problem: Education: Goal: Knowledge of General Education information will improve Description Including pain rating scale, medication(s)/side effects and non-pharmacologic comfort measures Outcome: Progressing   

## 2019-03-20 NOTE — Progress Notes (Signed)
Subjective: HD: 0  Overnight Events:  Overnight the patient's MAP dropped to 60, and remained in the mid 60s throughout the early morning. He ws also hypotensive ranging 92-117/45-95. HR remained stable in to mid 70s thoughout the night, and RR ws stable at 18-20.   Gavin Arroyo was seen on rounds this morning and appeared sleepy and a little weak but reported feeling "alright". He reported having approximately 6-7 large loose bowel movements overnight and said that they appeared to be more red than those that he had had the previous day, which brought him in to the hospital. His stools have also now been very loose since admission.  He reports that his shortness of breath and dizziness he was feeling yesterday has resolved since receiving blood transfusions. He denied any abdominal pain, current shortness of breath, and dizziness. He was told about the plan to proceed with a colonoscopy and potential EGD today and agreed to the plan without any concerns or questions at this time.   Objective:  Vital signs in last 24 hours: Vitals:   03/20/19 0506 03/20/19 0609 03/20/19 0629 03/20/19 0930  BP: (!) 108/54 (!) 92/45 (!) 100/50 112/60  Pulse: 75 78 75 82  Resp: 19 20 20 18   Temp: 98.5 F (36.9 C) 98.6 F (37 C) 97.9 F (36.6 C) 97.6 F (36.4 C)  TempSrc: Oral Oral Oral Oral  SpO2: 100% 100% 98% 96%  Weight:      Height:       Weight change:   Intake/Output Summary (Last 24 hours) at 03/20/2019 1106 Last data filed at 03/20/2019 0956 Gross per 24 hour  Intake 4345 ml  Output 2 ml  Net 4343 ml   Physical Exam Cardiovascular:     Rate and Rhythm: Normal rate and regular rhythm.     Pulses: Normal pulses.     Heart sounds: Normal heart sounds.  Pulmonary:     Effort: Pulmonary effort is normal.     Breath sounds: Normal breath sounds.  Abdominal:     General: Abdomen is flat. Bowel sounds are normal.     Palpations: Abdomen is soft.     Tenderness: There is no abdominal  tenderness.     Hernia: A hernia is present.  Skin:    General: Skin is warm and dry.  Neurological:     General: No focal deficit present.     Mental Status: He is alert and oriented to person, place, and time.  Psychiatric:        Mood and Affect: Mood normal.    Pertinent Labs: CBC Latest Ref Rng & Units 03/20/2019 03/19/2019 03/18/2019  WBC 4.0 - 10.5 K/uL 8.2 8.9 5.8  Hemoglobin 13.0 - 17.0 g/dL 5.9(LL) 5.0(LL) 7.3(L)  Hematocrit 39.0 - 52.0 % 17.7(L) 16.4(L) 21.9(L)  Platelets 150 - 400 K/uL 133(L) 170 133(L)   CMP Latest Ref Rng & Units 03/20/2019 03/19/2019 03/18/2019  Glucose 70 - 99 mg/dL 109(H) 124(H) 100(H)  BUN 8 - 23 mg/dL 14 15 7(L)  Creatinine 0.61 - 1.24 mg/dL 0.96 1.08 0.84  Sodium 135 - 145 mmol/L 140 138 139  Potassium 3.5 - 5.1 mmol/L 3.9 4.2 3.7  Chloride 98 - 111 mmol/L 115(H) 112(H) 113(H)  CO2 22 - 32 mmol/L 18(L) 16(L) 20(L)  Calcium 8.9 - 10.3 mg/dL 7.3(L) 7.5(L) 7.6(L)  Total Protein 6.5 - 8.1 g/dL 3.5(L) 3.8(L) 4.0(L)  Total Bilirubin 0.3 - 1.2 mg/dL 1.4(H) 0.9 1.2  Alkaline Phos 38 - 126 U/L 46  58 56  AST 15 - 41 U/L 39 54(H) 58(H)  ALT 0 - 44 U/L 23 26 31     Ref Range & Units 03:39 1 d ago 2 d ago  Prothrombin Time 11.4 - 15.2 seconds 18.5High   18.2High   17.2High    INR 0.8 - 1.2 1.6High   1.5High  CM  1.4High  CM    aPTT: 03/20/19 29 03/14/19 31  Iron/TIBC/Ferritin/ %Sat    Component Value Date/Time   IRON 23 (L) 03/19/2019 1735   TIBC 241 (L) 03/19/2019 1735   FERRITIN 68 03/19/2019 1735   IRONPCTSAT 10 (L) 03/19/2019 1735    Ref Range & Units 1 d ago 6 d ago  Fecal Occult Bld NEGATIVE POSITIVEAbnormal   POSITIVEAbnormal      Assessment/Plan:  Active Problems:   GI bleed   Acute blood loss anemia  Summary: Gavin Arroyo is a 64 year old male with a past medical history of ETOH liver cirrhosis, esophageal varices, portal HTN gastropathy, perforated ulcer, diverticulosis, and recently hospitalized for diverticular bleed 03/06-03-10  who presented due to 4 episodes of melena, lightheadedness, and fatigue due to a GI bleed of unknown origin potentially from polypectomy sites from recent colonoscopy.   GI Bleed, acute blood loss anemia:  The patient has continued melena and potential hematochezia since admission. He has received 3 units of pRBCs, but hemoglobin has been minimally responsive. Hgb after transfusion of 2 units only increased to 5.9 from 5.0. He has since received another unit, with follow up hgb and hct pending. His non-responsiveness to pRBC infusion and reported continued melena and now hematochezia is concerning for an ongoing bleed. Additionally, his decreased ferritin and saturation ratios support ongoing blood loss. It is likely that his bleeding is due to recent polypectomy. However, an AVM that was unable to be visualized during recent colonoscopy is also probable given his ongoing bleeding, and minimal hgb response to transfusion during prior hospital course. Bleeding from his esophageal varices is also a possibility, but is less likely given his lack of hemoptysis, hematemesis and now reported hematochezia.   - GI consulted; to proceed with colonoscopy today followed by EGD if now source of bleeding found; appreciate recommendations - continue protonix 40mg  IV 2x/day  - continue to monitor H&H  - transfuse if Hgb drops below 7  - continue telemetry monitoring  - continue daily CBC - continue zofran 4mg  every 6hr as needed    Alcohol use disorder: The patient reports no alcohol usage since discharge on 03/10 and showed no signs or symptoms during recent hospital course.  - monitor for signs or symptoms of withdrawal  - CIWA w/o ativan  - continue thiamine  - continue folate      LOS: 0 days   Mikael Spray, Medical Student 03/20/2019, 11:06 AM

## 2019-03-20 NOTE — Progress Notes (Signed)
CRITICAL VALUE ALERT  Critical Value:Hemoglobin 5.9  Date & Time Notied:  Seen on Lab results  Provider Notified:Dr Madilyn Fireman  Orders Received/Actions taken: Yes

## 2019-03-20 NOTE — Interval H&P Note (Signed)
History and Physical Interval Note: 63/male with recent EGD (small varices and portal hypertensive gastropathy), colonoscopy(multiple large polyps removed) presents with rectal bleeding and anemia. We will proceed with a colonoscopy, if unremarkable, an EGD, if that too is unremarkable, then a small bowel pillcam deployment.  03/20/2019 12:35 PM  Brandon Melnick  has presented today for colonoscopy +/- EGD +/- small bowel pillcam deployment, with the diagnosis of hematochezia.  The various methods of treatment have been discussed with the patient and family. After consideration of risks, benefits and other options for treatment, the patient has consented to  Procedure(s): COLONOSCOPY WITH PROPOFOL (N/A) as a surgical intervention.  The patient's history has been reviewed, patient examined, no change in status, stable for surgery.  I have reviewed the patient's chart and labs.  Questions were answered to the patient's satisfaction.     Ronnette Juniper

## 2019-03-20 NOTE — Anesthesia Procedure Notes (Signed)
Procedure Name: MAC Date/Time: 03/20/2019 1:02 PM Performed by: Trinna Post., CRNA Pre-anesthesia Checklist: Patient identified, Emergency Drugs available, Suction available, Patient being monitored and Timeout performed Patient Re-evaluated:Patient Re-evaluated prior to induction Oxygen Delivery Method: Nasal cannula Preoxygenation: Pre-oxygenation with 100% oxygen Induction Type: IV induction Placement Confirmation: positive ETCO2

## 2019-03-20 NOTE — Op Note (Addendum)
[facility] Patient Name: Gavin Arroyo Procedure Date : 03/20/2019 MRN: [id number] Attending MD: Ronnette Juniper , MD Date of Birth: Jun 01, 1955 CSN: [id number] Age: 64 Admit Type: Inpatient Procedure:                Colonoscopy Indications:              Rectal bleeding Providers:                Ronnette Juniper, MD, Grace Isaac, RN, Elspeth Cho                            Tech., Technician, Dewitt Hoes, CRNA Referring MD:             Internal Medicine Teaching Service Medicines:                Monitored Anesthesia Care Complications:            No immediate complications. Estimated blood loss:                            None. Estimated Blood Loss:     Estimated blood loss: none. Procedure:                Pre-Anesthesia Assessment:                           - Prior to the procedure, a History and Physical                            was performed, and patient medications and                            allergies were reviewed. The patient's tolerance of                            previous anesthesia was also reviewed. The risks                            and benefits of the procedure and the sedation                            options and risks were discussed with the patient.                            All questions were answered, and informed consent                            was obtained. Prior Anticoagulants: The patient has                            taken no previous anticoagulant or antiplatelet                            agents. ASA Grade Assessment: III - A patient with  severe systemic disease. After reviewing the risks                            and benefits, the patient was deemed in                            satisfactory condition to undergo the procedure.                           After obtaining informed consent, the colonoscope                            was passed under direct vision. Throughout the                            procedure,  the patient's blood pressure, pulse, and                            oxygen saturations were monitored continuously. The                            [id number]) Olympus pediatric colonscope                            was introduced through the anus and advanced to the                            the transverse colon. The colonoscopy was                            technically difficult and complex due to transverse                            colon is within a large ventral hernia. Successful                            completion of the procedure was aided by applying                            abdominal pressure. The patient tolerated the                            procedure well. The quality of the bowel                            preparation was excellent. Scope In: 1:09:40 PM Scope Out: 1:50:04 PM Total Procedure Duration: 0 hours 40 minutes 24 seconds  Findings:      The lumen of the mid transverse colon was narrowed likely due to       transverse colon being within a large ventral hernia.      Despite changing the patient's position to supine and applying abdominal       pressure, even after a prolonged effort, the scope could not be advanced       in  to the proximal transverse colon as before.      A 4 mm polyp was found in the mid transverse colon. The polyp was       sessile. Polypectomy was not attempted. The polyp could be seen but the       scope could not be held in an adequate position for polypectomy, due to       significant looping an redundant colon due to large ventral hernia.      A localized area of moderately ulcerated mucosa was found in the distal       transverse colon, these were the sites of prior polypectomy and one       ulcer had a visible vessel. To prevent bleeding after the polypectomy,       two hemostatic clips were successfully placed (MR conditional). There       was no bleeding at the end of the procedure.      A 6 mm polyp was found in the  sigmoid colon. The polyp was sessile. The       polyp was removed with a hot snare. Resection and retrieval were       complete. To prevent bleeding after the polypectomy, two hemostatic       clips were successfully placed (MR conditional). There was no bleeding       at the end of the procedure.      A few small and large-mouthed diverticula were found in the sigmoid       colon and descending colon. Impression:               - One 4 mm polyp in the mid transverse colon.                            Resection not attempted. Significant looping                            encountered as the colon was within a large ventral                            hernia.                           - Ulcerated mucosa in the distal transverse colon.                            Clips (MR conditional) were placed.                           - One 6 mm polyp in the sigmoid colon, removed with                            a hot snare. Resected and retrieved. Clips (MR                            conditional) were placed.                           - Diverticulosis in the sigmoid colon and in the  descending colon. Moderate Sedation:      Patient did not receive moderate sedation for this procedure, but       instead received monitored anesthesia care. Recommendation:           - Clear liquid diet.                           - Continue present medications.                           - Await pathology results.                           - Refer to a surgeon at appointment to be scheduled                            for repair of large ventral hernia.                           Patient will need surveillance colonoscopy in the                            future, but this will be extremely difficult                            without repair of the large ventral hernia. Procedure Code(s):        --- Professional ---                           [phone], 52, Colonoscopy, flexible; with removal of                             tumor(s), polyp(s), or other lesion(s) by snare                            technique Diagnosis Code(s):        --- Professional ---                           K63.5, Polyp of colon                           K63.3, Ulcer of intestine                           K57.30, Diverticulosis of large intestine without                            perforation or abscess without bleeding CPT copyright 2019 [employer]. All rights reserved. The codes documented in this report are preliminary and upon coder review may  be revised to meet current compliance requirements. Ronnette Juniper, MD 03/20/2019 2:05:26 PM This report has been signed electronically. Number of Addenda: 0

## 2019-03-20 NOTE — Anesthesia Preprocedure Evaluation (Signed)
Anesthesia Evaluation  Patient identified by MRN, date of birth, ID band Patient awake    Reviewed: Allergy & Precautions, H&P , NPO status , Patient's Chart, lab work & pertinent test results  Airway Mallampati: II  TM Distance: >3 FB Neck ROM: Full    Dental no notable dental hx. (+) Poor Dentition, Dental Advisory Given   Pulmonary Current Smoker and Patient abstained from smoking.,    Pulmonary exam normal breath sounds clear to auscultation       Cardiovascular negative cardio ROS   Rhythm:Regular Rate:Normal     Neuro/Psych negative neurological ROS  negative psych ROS   GI/Hepatic negative GI ROS, (+)     substance abuse  alcohol use, GI Bleed   Endo/Other  negative endocrine ROS  Renal/GU negative Renal ROS  negative genitourinary   Musculoskeletal   Abdominal   Peds  Hematology  (+) Blood dyscrasia, anemia ,   Anesthesia Other Findings   Reproductive/Obstetrics negative OB ROS                             Anesthesia Physical Anesthesia Plan  ASA: III  Anesthesia Plan: MAC   Post-op Pain Management:    Induction: Intravenous  PONV Risk Score and Plan: 1 and Propofol infusion  Airway Management Planned: Nasal Cannula  Additional Equipment:   Intra-op Plan:   Post-operative Plan:   Informed Consent: I have reviewed the patients History and Physical, chart, labs and discussed the procedure including the risks, benefits and alternatives for the proposed anesthesia with the patient or authorized representative who has indicated his/her understanding and acceptance.     Dental advisory given  Plan Discussed with: CRNA  Anesthesia Plan Comments:         Anesthesia Quick Evaluation

## 2019-03-20 NOTE — Op Note (Addendum)
Colonoscopy was performed for rectal bleeding.   Findings: The scope could not be advanced beyond the mid transverse colon, as the lumen was narrowed. The entire transverse colon was within a large ventral hernia noted on CAT scan. Despite changing the patient's position to supine and applying abdominal pressure, scope could not be advanced beyond the luminal narrowing.  1 small polyp about 4 mm was found beyond the luminal narrowing, but was not removed because adequate positioning for polypectomy was not achieved.   2 areas of ulceration was found in the distal transverse colon at the site of previous polypectomy, 1 appeared to have a small visible vessel.  Endoclips were applied in both the sites.  1 small polyp was noted in the sigmoid colon, 6 mm in size, removed with hot snare and 2 endoclips were placed to prevent bleeding post polypectomy.  Scattered diverticulosis was noted in the sigmoid and descending.   Recommendation: Clear liquid diet for now. Recommend surgical evaluation for repair of large ventral hernia which contains the entire transverse colon as noted on CAT scan. Patient will need surveillance colonoscopies in the future as he had large tubular adenomas removed.   Ronnette Juniper, MD

## 2019-03-20 NOTE — Transfer of Care (Signed)
Immediate Anesthesia Transfer of Care Note  Patient: Gavin Arroyo  Procedure(s) Performed: COLONOSCOPY WITH PROPOFOL (N/A ) HEMOSTASIS CLIP PLACEMENT POLYPECTOMY HEMOSTASIS CONTROL  Patient Location: PACU and Endoscopy Unit  Anesthesia Type:MAC  Level of Consciousness: awake, alert  and oriented  Airway & Oxygen Therapy: Patient Spontanous Breathing and Patient connected to nasal cannula oxygen  Post-op Assessment: Report given to RN and Post -op Vital signs reviewed and stable  Post vital signs: Reviewed and stable  Last Vitals:  Vitals Value Taken Time  BP    Temp    Pulse 73 03/20/19 1402  Resp 17 03/20/19 1405  SpO2 96 % 03/20/19 1402  Vitals shown include unvalidated device data.  Last Pain:  Vitals:   03/20/19 1257  TempSrc: Axillary  PainSc: 0-No pain         Complications: No apparent anesthesia complications

## 2019-03-20 NOTE — Anesthesia Postprocedure Evaluation (Signed)
Anesthesia Post Note  Patient: Gavin Arroyo  Procedure(s) Performed: COLONOSCOPY WITH PROPOFOL (N/A ) HEMOSTASIS CLIP PLACEMENT POLYPECTOMY HEMOSTASIS CONTROL     Patient location during evaluation: Endoscopy Anesthesia Type: MAC Level of consciousness: awake and alert Pain management: pain level controlled Vital Signs Assessment: post-procedure vital signs reviewed and stable Respiratory status: spontaneous breathing, nonlabored ventilation and respiratory function stable Cardiovascular status: stable and blood pressure returned to baseline Postop Assessment: no apparent nausea or vomiting Anesthetic complications: no    Last Vitals:  Vitals:   03/20/19 1410 03/20/19 1420  BP: (!) 116/52 (!) 115/54  Pulse: 74 72  Resp: 17 14  Temp:    SpO2: 98% 99%    Last Pain:  Vitals:   03/20/19 1410  TempSrc:   PainSc: 0-No pain                 Abriel Hattery,W. EDMOND

## 2019-03-20 NOTE — Brief Op Note (Signed)
03/19/2019 - 03/20/2019  2:05 PM  PATIENT:  Gavin Arroyo  64 y.o. male  PRE-OPERATIVE DIAGNOSIS:  hematochezia  POST-OPERATIVE DIAGNOSIS:  Colon: unable to pass into transverse through hernial pouch. 2 clips placed to mark previous polyp removal sites.  2 ulcerated areas in descend; 2 clips placed. 2 polyps removed  PROCEDURE:  Procedure(s): COLONOSCOPY WITH PROPOFOL (N/A) HEMOSTASIS CLIP PLACEMENT POLYPECTOMY HEMOSTASIS CONTROL  SURGEON:  Surgeon(s) and Role:    Ronnette Juniper, MD - Primary  PHYSICIAN ASSISTANT:   ASSISTANTS: Penni Homans, Tech  ANESTHESIA:   MAC  EBL:  0 mL   BLOOD ADMINISTERED:none  DRAINS: none   LOCAL MEDICATIONS USED:  NONE  SPECIMEN:  Biopsy / Limited Resection  DISPOSITION OF SPECIMEN:  PATHOLOGY  COUNTS:  YES  TOURNIQUET:  * No tourniquets in log *  DICTATION: .Dragon Dictation  PLAN OF CARE: Admit to inpatient   PATIENT DISPOSITION:  PACU - hemodynamically stable.   Delay start of Pharmacological VTE agent (>24hrs) due to surgical blood loss or risk of bleeding: not applicable

## 2019-03-21 MED ORDER — PHYTONADIONE 5 MG PO TABS
2.5000 mg | ORAL_TABLET | Freq: Once | ORAL | Status: AC
  Administered 2019-03-21: 2.5 mg via ORAL
  Filled 2019-03-21 (×2): qty 1

## 2019-03-21 NOTE — Progress Notes (Signed)
Subjective: Patient states he had 2 bowel movement since colonoscopy yesterday. He noticed red blood about a tablespoon loose bowel movement in a.m., has not had any bowel movements in the afternoon. Denies abdominal pain currently and is requesting for regular diet.  Objective: Vital signs in last 24 hours: Temp:  [98.5 F (36.9 C)-98.6 F (37 C)] 98.6 F (37 C) (03/13 1459) Pulse Rate:  [76-84] 80 (03/13 1459) Resp:  [16-18] 16 (03/13 1459) BP: (100-113)/(56-66) 100/66 (03/13 1459) SpO2:  [97 %-100 %] 100 % (03/13 1459) Weight change: 0 kg Last BM Date: 03/20/19(Not bloody anymore per pt)  PE: Thinly built, not in distress GENERAL: Prominent pallor, no icterus ABDOMEN: Large ventral hernia, distended abdomen, nontender EXTREMITIES: no deformity  Lab Results: Results for orders placed or performed during the hospital encounter of 03/19/19 (from the past 48 hour(s))  Iron and TIBC     Status: Abnormal   Collection Time: 03/19/19  5:35 PM  Result Value Ref Range   Iron 23 (L) 45 - 182 ug/dL   TIBC 241 (L) 250 - 450 ug/dL   Saturation Ratios 10 (L) 17.9 - 39.5 %   UIBC 218 ug/dL    Comment: Performed at [facility], [address] [place], [place] [address]  Ferritin     Status: None   Collection Time: 03/19/19  5:35 PM  Result Value Ref Range   Ferritin 68 24 - 336 ng/mL    Comment: Performed at [facility] [address], [place], [place] [address]  Respiratory Panel by RT PCR (Flu A&B, Covid) - Nasopharyngeal Swab     Status: None   Collection Time: 03/19/19  6:35 PM   Specimen: Nasopharyngeal Swab  Result Value Ref Range   SARS Coronavirus 2 by RT PCR NEGATIVE NEGATIVE    Comment: (NOTE) SARS-CoV-2 target nucleic acids are NOT DETECTED. The SARS-CoV-2 RNA is generally detectable in upper respiratoy specimens during the acute phase of infection. The lowest concentration of SARS-CoV-2 viral copies this assay can detect is 131 copies/mL. A negative  result does not preclude SARS-Cov-2 infection and should not be used as the sole basis for treatment or other patient management decisions. A negative result may occur with  improper specimen collection/handling, submission of specimen other than nasopharyngeal swab, presence of viral mutation(s) within the areas targeted by this assay, and inadequate number of viral copies (<131 copies/mL). A negative result must be combined with clinical observations, patient history, and epidemiological information. The expected result is Negative. Fact Sheet for Patients:  PinkCheek.be Fact Sheet for Healthcare Providers:  GravelBags.it This test is not yet ap proved or cleared by the [place] FDA and  has been authorized for detection and/or diagnosis of SARS-CoV-2 by FDA under an Emergency Use Authorization (EUA). This EUA will remain  in effect (meaning this test can be used) for the duration of the COVID-19 declaration under Section 564(b)(1) of the Act, 21 U.S.C. section 360bbb-3(b)(1), unless the authorization is terminated or revoked sooner.    Influenza A by PCR NEGATIVE NEGATIVE   Influenza B by PCR NEGATIVE NEGATIVE    Comment: (NOTE) The Xpert Xpress SARS-CoV-2/FLU/RSV assay is intended as an aid in  the diagnosis of influenza from Nasopharyngeal swab specimens and  should not be used as a sole basis for treatment. Nasal washings and  aspirates are unacceptable for Xpert Xpress SARS-CoV-2/FLU/RSV  testing. Fact Sheet for Patients: PinkCheek.be Fact Sheet for Healthcare Providers: GravelBags.it This test is not yet  approved or cleared by the [place] and  has been authorized for detection and/or diagnosis of SARS-CoV-2 by  FDA under an Emergency Use Authorization (EUA). This EUA will remain  in effect (meaning this test can be used) for the duration of the   Covid-19 declaration under Section 564(b)(1) of the Act, 21  U.S.C. section 360bbb-3(b)(1), unless the authorization is  terminated or revoked. Performed at [facility], [place] [address] [place], [place] [address]   CBC     Status: Abnormal   Collection Time: 03/20/19  3:39 AM  Result Value Ref Range   WBC 8.2 4.0 - 10.5 K/uL   RBC 1.89 (L) 4.22 - 5.81 MIL/uL   Hemoglobin 5.9 (LL) 13.0 - 17.0 g/dL    Comment: REPEATED TO VERIFY CRITICAL VALUE NOTED.  VALUE IS CONSISTENT WITH PREVIOUSLY REPORTED AND CALLED VALUE.    HCT 17.7 (L) 39.0 - 52.0 %   MCV 93.7 80.0 - 100.0 fL    Comment: REPEATED TO VERIFY DELTA CHECK NOTED    MCH 31.2 26.0 - 34.0 pg   MCHC 33.3 30.0 - 36.0 g/dL   RDW 17.8 (H) 11.5 - 15.5 %   Platelets 133 (L) 150 - 400 K/uL   nRBC 0.0 0.0 - 0.2 %    Comment: Performed at [facility] [address], [place], [place] [address]  Comprehensive metabolic panel     Status: Abnormal   Collection Time: 03/20/19  3:39 AM  Result Value Ref Range   Sodium 140 135 - 145 mmol/L   Potassium 3.9 3.5 - 5.1 mmol/L   Chloride 115 (H) 98 - 111 mmol/L   CO2 18 (L) 22 - 32 mmol/L   Glucose, Bld 109 (H) 70 - 99 mg/dL    Comment: Glucose reference range applies only to samples taken after fasting for at least 8 hours.   BUN 14 8 - 23 mg/dL   Creatinine, Ser 0.96 0.61 - 1.24 mg/dL   Calcium 7.3 (L) 8.9 - 10.3 mg/dL   Total Protein 3.5 (L) 6.5 - 8.1 g/dL   Albumin 1.6 (L) 3.5 - 5.0 g/dL   AST 39 15 - 41 U/L   ALT 23 0 - 44 U/L   Alkaline Phosphatase 46 38 - 126 U/L   Total Bilirubin 1.4 (H) 0.3 - 1.2 mg/dL   GFR calc non Af Amer >60 >60 mL/min   GFR calc Af Amer >60 >60 mL/min   Anion gap 7 5 - 15    Comment: Performed at [facility] [address] [place], [place] [address]  Protime-INR     Status: Abnormal   Collection Time: 03/20/19  3:39 AM  Result Value Ref Range   Prothrombin Time 18.5 (H) 11.4 - 15.2 seconds   INR 1.6 (H) 0.8 - 1.2    Comment:  (NOTE) INR goal varies based on device and disease states. Performed at [facility], [place] [address] [place], [place] [address]   APTT     Status: None   Collection Time: 03/20/19  3:39 AM  Result Value Ref Range   aPTT 29 24 - 36 seconds    Comment: Performed at [facility] [address] [place], [place] [address]  Prepare RBC     Status: None   Collection Time: 03/20/19  5:42 AM  Result Value Ref Range   Order Confirmation      ORDER PROCESSED BY BLOOD BANK Performed at  [facility], [place] [address], [place], [place] [address]   Hemoglobin and hematocrit, blood     Status: Abnormal   Collection Time: 03/20/19 11:29 AM  Result Value Ref Range   Hemoglobin 6.6 (LL) 13.0 - 17.0 g/dL    Comment: REPEATED TO VERIFY CRITICAL VALUE NOTED.  VALUE IS CONSISTENT WITH PREVIOUSLY REPORTED AND CALLED VALUE.    HCT 19.6 (L) 39.0 - 52.0 %    Comment: Performed at [facility], [place] [address], [place], [place] [address]  Prepare RBC     Status: None   Collection Time: 03/20/19 12:08 PM  Result Value Ref Range   Order Confirmation      ORDER PROCESSED BY BLOOD BANK Performed at [facility], [place] [address], [place], [place] [address]   Hemoglobin and hematocrit, blood     Status: Abnormal   Collection Time: 03/20/19 10:04 PM  Result Value Ref Range   Hemoglobin 7.4 (L) 13.0 - 17.0 g/dL   HCT 22.0 (L) 39.0 - 52.0 %    Comment: Performed at [facility] [address] [place], [place] [address]    Studies/Results: No results found.  Medications: I have reviewed the patient's current medications.  Assessment: Patient presented with rectal bleeding after having colonoscopy on 03/16/2019 multiple large polyps removed Most likely had post polypectomy bleeding  Colonoscopy attempted yesterday, unable to advance beyond the transverse colon( into the hernia loop) EndoClips were applied in each of the ulcerated areas- post polypectomy  Patient  diagnosed with cirrhosis on CAT scan from 03/14/2019, EGD showed small esophageal varices and portal hypertensive gastropathy  Large ventral hernia containing loop of transverse colon  Plan: Advance diet to regular H&H in a.m. He wants to be discharged home in a.m. if his hemoglobin is stable and he has no further episodes of rectal bleeding.  Patient has a large ventral hernia, as per surgical evaluation no acute surgical intervention indicated, recommended to transfer to tertiary care center more acutely if he develops signs of obstruction or referred to tertiary care center for elective hernia repair given potential for significant complication.  This is of utmost importance, as patient had multiple tubular adenomas removed, will require surveillance colonoscopies in the future.     Ronnette Juniper, MD 03/21/2019, 5:09 PM

## 2019-03-21 NOTE — Plan of Care (Signed)
  Problem: Activity: Goal: Risk for activity intolerance will decrease Outcome: Progressing   Problem: Nutrition: Goal: Adequate nutrition will be maintained Outcome: Progressing   

## 2019-03-21 NOTE — Consult Note (Signed)
[facility] Surgery Consult Note  Gavin Arroyo October 08, 1955  [id number].    Requesting MD: Guilloud Chief Complaint/Reason for Consult: ventral hernia HPI:  Patient is a 64 year old male who presented to [facility] 03/19/19 for weakness and bloody stools. He was discharged 03/18/19 after being admitted for the same but had improved. 3/11 he woke up with more dark bloody stools and started feeling weak and lightheaded. Underwent colonoscopy 03/16/19 which showed trace/grade 1 varices with portal hypertensive gastropathy and 6 large colonic polyps varying from 9 mm to 2.5 cm. Repeat colonoscopy was attempted 3/12 to try to localize bleeding but was not able to be completed because transverse colon in ventral hernia. 2 areas of ulceration at site of previous polypectomy were clipped. Patient reports hernia is sore but this is not acute over the last few days. Currently feels that bloody stools are improving slightly. Denies nausea or vomiting. No other known chronic medical conditions. Does not take any medications. NKDA. Past abdominal surgery includes exploratory laparotomy with Phillip Heal patch for perforated ulcer in 2010. He does not take any NSAIDS. Smokes 1-2 cigarettes daily. Drinks 1 pint liquor daily. Was not taking any PPI or H2 blocker.   ROS: Review of Systems  Constitutional: Negative for chills and fever.  Respiratory: Negative for shortness of breath.   Cardiovascular: Negative for chest pain.  Gastrointestinal: Positive for abdominal pain, blood in stool and melena. Negative for constipation, diarrhea, nausea and vomiting.  Genitourinary: Negative for dysuria, frequency and urgency.  Neurological: Positive for dizziness and weakness.  Psychiatric/Behavioral: Positive for substance abuse.  All other systems reviewed and are negative.   History reviewed. No pertinent family history.  Past Medical History:  Diagnosis Date  . GI bleeding 03/2019    Past Surgical History:  Procedure  Laterality Date  . COLONOSCOPY WITH PROPOFOL N/A 03/16/2019   Procedure: COLONOSCOPY WITH PROPOFOL;  Surgeon: Ronnette Juniper, MD;  Location: [facility];  Service: Gastroenterology;  Laterality: N/A;  . ESOPHAGOGASTRODUODENOSCOPY (EGD) WITH PROPOFOL N/A 03/16/2019   Procedure: ESOPHAGOGASTRODUODENOSCOPY (EGD) WITH PROPOFOL;  Surgeon: Ronnette Juniper, MD;  Location: [place];  Service: Gastroenterology;  Laterality: N/A;  . POLYPECTOMY  03/16/2019   Procedure: POLYPECTOMY;  Surgeon: Ronnette Juniper, MD;  Location: [facility] ENDOSCOPY;  Service: Gastroenterology;;  . REPAIR OF PERFORATED ULCER  2010  ?    Social History:  reports that he has been smoking cigarettes. He has been smoking about 0.25 packs per day. He has never used smokeless tobacco. He reports current alcohol use of about 1.0 standard drinks of alcohol per week. He reports current drug use. Drug: Cocaine.  Allergies: No Known Allergies  Medications Prior to Admission  Medication Sig Dispense Refill  . HYDROcodone-acetaminophen (NORCO/VICODIN) 5-325 MG per tablet Take 1 tablet by mouth every 6 (six) hours as needed. 10 tablet 0    Blood pressure (!) 104/56, pulse 76, temperature 98.5 F (36.9 C), temperature source Oral, resp. rate 18, height 5\' 10"  (1.778 m), weight 71.8 kg, SpO2 97 %. Physical Exam:  General: pleasant, white male who is laying in bed in NAD HEENT:  Sclera are anicteric.  PERRL.  Ears and nose without any masses or lesions.  Mouth is pink and moist Heart: regular, rate, and rhythm.  Normal s1,s2. No obvious murmurs, gallops, or rubs noted.  Palpable radial and pedal pulses bilaterally Lungs: CTAB, no wheezes, rhonchi, or rales noted.  Respiratory effort nonlabored Abd: soft, NT, mildly distended, +BS, ventral hernias that are soft and partially reducible,  no skin changes overlying hernia, midline surgical scar MS: all 4 extremities are symmetrical with no cyanosis, clubbing, or edema. Skin: warm and dry with no masses, lesions,  or rashes Neuro: Cranial nerves 2-12 grossly intact, sensation grossly intact throughout  Psych: A&Ox3 with an appropriate affect.   Results for orders placed or performed during the hospital encounter of 03/19/19 (from the past 48 hour(s))  Type and screen [place]     Status: None   Collection Time: 03/19/19  1:39 PM  Result Value Ref Range   ABO/RH(D) A POS    Antibody Screen NEG    Sample Expiration      03/19/2019,2359 Performed at [facility], [place] [address], [place], [place] [address]   POC occult blood, ED Provider will collect     Status: Abnormal   Collection Time: 03/19/19  2:37 PM  Result Value Ref Range   Fecal Occult Bld POSITIVE (A) NEGATIVE  Type and screen Ordered by PROVIDER DEFAULT     Status: None (Preliminary result)   Collection Time: 03/19/19  2:40 PM  Result Value Ref Range   ABO/RH(D) A POS    Antibody Screen NEG    Sample Expiration 03/22/2019,2359    Unit Number [id number]    Blood Component Type RBC LR PHER1    Unit division 00    Status of Unit ISSUED,FINAL    Transfusion Status OK TO TRANSFUSE    Crossmatch Result Compatible    Unit Number [id number]    Blood Component Type RED CELLS,LR    Unit division 00    Status of Unit ISSUED,FINAL    Transfusion Status OK TO TRANSFUSE    Crossmatch Result Compatible    Unit Number [id number]    Blood Component Type RED CELLS,LR    Unit division 00    Status of Unit ISSUED    Transfusion Status OK TO TRANSFUSE    Crossmatch Result Compatible    Unit Number [id number]    Blood Component Type RED CELLS,LR    Unit division 00    Status of Unit ISSUED    Transfusion Status OK TO TRANSFUSE    Crossmatch Result      Compatible Performed at [facility], [address] [place], [place] [address]   Protime-INR     Status: Abnormal   Collection Time: 03/19/19  2:40 PM  Result Value Ref Range   Prothrombin Time 18.2 (H) 11.4 - 15.2 seconds   INR 1.5 (H) 0.8 -  1.2    Comment: (NOTE) INR goal varies based on device and disease states. Performed at [facility], [place] [address], [place], [place] [address]   Comprehensive metabolic panel     Status: Abnormal   Collection Time: 03/19/19  3:29 PM  Result Value Ref Range   Sodium 138 135 - 145 mmol/L   Potassium 4.2 3.5 - 5.1 mmol/L   Chloride 112 (H) 98 - 111 mmol/L   CO2 16 (L) 22 - 32 mmol/L   Glucose, Bld 124 (H) 70 - 99 mg/dL    Comment: Glucose reference range applies only to samples taken after fasting for at least 8 hours.   BUN 15 8 - 23 mg/dL   Creatinine, Ser 1.08 0.61 - 1.24 mg/dL   Calcium 7.5 (L) 8.9 - 10.3 mg/dL   Total Protein 3.8 (L) 6.5 - 8.1 g/dL   Albumin 1.8 (L) 3.5 - 5.0 g/dL   AST 54 (H) 15 -  41 U/L   ALT 26 0 - 44 U/L   Alkaline Phosphatase 58 38 - 126 U/L   Total Bilirubin 0.9 0.3 - 1.2 mg/dL   GFR calc non Af Amer >60 >60 mL/min   GFR calc Af Amer >60 >60 mL/min   Anion gap 10 5 - 15    Comment: Performed at [place] [address] [place] [address]  CBC     Status: Abnormal   Collection Time: 03/19/19  3:29 PM  Result Value Ref Range   WBC 8.9 4.0 - 10.5 K/uL   RBC 1.56 (L) 4.22 - 5.81 MIL/uL   Hemoglobin 5.0 (LL) 13.0 - 17.0 g/dL    Comment: This critical result has verified and been called to Bertie by Remus Loffler on 03 11 2021 at 1548, and has been read back. MCV DELTA VERIFIED REPEATED TO VERIFY CORRECTED ON 03/11 AT 1957: PREVIOUSLY REPORTED AS 5.0 This critical result has verified and been called to M COFFEY RN by Remus Loffler on 03 11 2021 at 1548, and has been read back. MCV DELTA VERIFIED    HCT 16.4 (L) 39.0 - 52.0 %   MCV 105.1 (H) 80.0 - 100.0 fL    Comment: REPEATED TO VERIFY   MCH 32.1 26.0 - 34.0 pg   MCHC 30.5 30.0 - 36.0 g/dL   RDW 19.3 (H) 11.5 - 15.5 %   Platelets 170 150 - 400 K/uL   nRBC 0.0 0.0 - 0.2 %    Comment: Performed at [place] [facility] [address], [place], [place] [address]   Prepare RBC     Status: None   Collection Time: 03/19/19  4:01 PM  Result Value Ref Range   Order Confirmation      ORDER PROCESSED BY BLOOD BANK Performed at [facility], [place] [address], [place], [place] [address]   Iron and TIBC     Status: Abnormal   Collection Time: 03/19/19  5:35 PM  Result Value Ref Range   Iron 23 (L) 45 - 182 ug/dL   TIBC 241 (L) 250 - 450 ug/dL   Saturation Ratios 10 (L) 17.9 - 39.5 %   UIBC 218 ug/dL    Comment: Performed at [facility], [place] [address], [place], [place] [address]  Ferritin     Status: None   Collection Time: 03/19/19  5:35 PM  Result Value Ref Range   Ferritin 68 24 - 336 ng/mL    Comment: Performed at [place] [address] [place],  [address]  Respiratory Panel by RT PCR (Flu A&B, Covid) - Nasopharyngeal Swab     Status: None   Collection Time: 03/19/19  6:35 PM   Specimen: Nasopharyngeal Swab  Result Value Ref Range   SARS Coronavirus 2 by RT PCR NEGATIVE NEGATIVE    Comment: (NOTE) SARS-CoV-2 target nucleic acids are NOT DETECTED. The SARS-CoV-2 RNA is generally detectable in upper respiratoy specimens during the acute phase of infection. The lowest concentration of SARS-CoV-2 viral copies this assay can detect is 131 copies/mL. A negative result does not preclude SARS-Cov-2 infection and should not be used as the sole basis for treatment or other patient management decisions. A negative result may occur with  improper specimen collection/handling, submission of specimen other than nasopharyngeal swab, presence of viral mutation(s) within the areas targeted by this assay, and inadequate number of viral copies (<131 copies/mL). A negative result must be  combined with clinical observations, patient history, and epidemiological information. The expected result is Negative. Fact Sheet for Patients:  PinkCheek.be Fact Sheet for Healthcare Providers:   GravelBags.it This test is not yet ap proved or cleared by the [place] FDA and  has been authorized for detection and/or diagnosis of SARS-CoV-2 by FDA under an Emergency Use Authorization (EUA). This EUA will remain  in effect (meaning this test can be used) for the duration of the COVID-19 declaration under Section 564(b)(1) of the Act, 21 U.S.C. section 360bbb-3(b)(1), unless the authorization is terminated or revoked sooner.    Influenza A by PCR NEGATIVE NEGATIVE   Influenza B by PCR NEGATIVE NEGATIVE    Comment: (NOTE) The Xpert Xpress SARS-CoV-2/FLU/RSV assay is intended as an aid in  the diagnosis of influenza from Nasopharyngeal swab specimens and  should not be used as a sole basis for treatment. Nasal washings and  aspirates are unacceptable for Xpert Xpress SARS-CoV-2/FLU/RSV  testing. Fact Sheet for Patients: PinkCheek.be Fact Sheet for Healthcare Providers: GravelBags.it This test is not yet approved or cleared by the [place] FDA and  has been authorized for detection and/or diagnosis of SARS-CoV-2 by  FDA under an Emergency Use Authorization (EUA). This EUA will remain  in effect (meaning this test can be used) for the duration of the  Covid-19 declaration under Section 564(b)(1) of the Act, 21  U.S.C. section 360bbb-3(b)(1), unless the authorization is  terminated or revoked. Performed at [facility], [place] [address], [place], [place] [address]   CBC     Status: Abnormal   Collection Time: 03/20/19  3:39 AM  Result Value Ref Range   WBC 8.2 4.0 - 10.5 K/uL   RBC 1.89 (L) 4.22 - 5.81 MIL/uL   Hemoglobin 5.9 (LL) 13.0 - 17.0 g/dL    Comment: REPEATED TO VERIFY CRITICAL VALUE NOTED.  VALUE IS CONSISTENT WITH PREVIOUSLY REPORTED AND CALLED VALUE.    HCT 17.7 (L) 39.0 - 52.0 %   MCV 93.7 80.0 - 100.0 fL    Comment: REPEATED TO VERIFY DELTA CHECK NOTED    MCH  31.2 26.0 - 34.0 pg   MCHC 33.3 30.0 - 36.0 g/dL   RDW 17.8 (H) 11.5 - 15.5 %   Platelets 133 (L) 150 - 400 K/uL   nRBC 0.0 0.0 - 0.2 %    Comment: Performed at [facility] [address] [place], [place] [address]  Comprehensive metabolic panel     Status: Abnormal   Collection Time: 03/20/19  3:39 AM  Result Value Ref Range   Sodium 140 135 - 145 mmol/L   Potassium 3.9 3.5 - 5.1 mmol/L   Chloride 115 (H) 98 - 111 mmol/L   CO2 18 (L) 22 - 32 mmol/L   Glucose, Bld 109 (H) 70 - 99 mg/dL    Comment: Glucose reference range applies only to samples taken after fasting for at least 8 hours.   BUN 14 8 - 23 mg/dL   Creatinine, Ser 0.96 0.61 - 1.24 mg/dL   Calcium 7.3 (L) 8.9 - 10.3 mg/dL   Total Protein 3.5 (L) 6.5 - 8.1 g/dL   Albumin 1.6 (L) 3.5 - 5.0 g/dL   AST 39 15 - 41 U/L   ALT 23 0 - 44 U/L   Alkaline Phosphatase 46 38 - 126 U/L   Total Bilirubin 1.4 (H) 0.3 - 1.2 mg/dL   GFR calc non Af Amer >60 >60 mL/min   GFR calc  Af Amer >60 >60 mL/min   Anion gap 7 5 - 15    Comment: Performed at [facility] [address], [place], [place] [address]  Protime-INR     Status: Abnormal   Collection Time: 03/20/19  3:39 AM  Result Value Ref Range   Prothrombin Time 18.5 (H) 11.4 - 15.2 seconds   INR 1.6 (H) 0.8 - 1.2    Comment: (NOTE) INR goal varies based on device and disease states. Performed at [facility], [place] [address], [place], [place] [address]   APTT     Status: None   Collection Time: 03/20/19  3:39 AM  Result Value Ref Range   aPTT 29 24 - 36 seconds    Comment: Performed at [facility] [address], [place], [place] [address]  Prepare RBC     Status: None   Collection Time: 03/20/19  5:42 AM  Result Value Ref Range   Order Confirmation      ORDER PROCESSED BY BLOOD BANK Performed at [facility], [place] [address] [place], [place] [address]   Hemoglobin and hematocrit, blood     Status: Abnormal   Collection Time: 03/20/19  11:29 AM  Result Value Ref Range   Hemoglobin 6.6 (LL) 13.0 - 17.0 g/dL    Comment: REPEATED TO VERIFY CRITICAL VALUE NOTED.  VALUE IS CONSISTENT WITH PREVIOUSLY REPORTED AND CALLED VALUE.    HCT 19.6 (L) 39.0 - 52.0 %    Comment: Performed at [facility], [place] [address] [place], [place] [address]  Prepare RBC     Status: None   Collection Time: 03/20/19 12:08 PM  Result Value Ref Range   Order Confirmation      ORDER PROCESSED BY BLOOD BANK Performed at [facility], [place] [address], [place], [place] [address]   Hemoglobin and hematocrit, blood     Status: Abnormal   Collection Time: 03/20/19 10:04 PM  Result Value Ref Range   Hemoglobin 7.4 (L) 13.0 - 17.0 g/dL   HCT 22.0 (L) 39.0 - 52.0 %    Comment: Performed at [facility] [address], [place], [place] [address]   No results found.    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 GI bleeding - unable to complete colonoscopy yesterday as transverse colon is partially in ventral hernia, if patient continued to have GI bleeding could consider CTA or radionucleotide bleeding scan to try to localize but would defer to GI on this - problem is also compounded by cirrhosis given INR 1.6 - consider giving FFP if continuing to bleed also  ABL anemia - secondary to above, hgb 7.4 yesterday s/p 4 units PRBC Ventral hernia - not incarcerated or strangulated currently, given above issues would not recommend urgent/emergent hernia repair - would recommend referral to tertiary care center for elective hernia repair given potential for significant complications  No acute surgical intervention indicated, defer management of GI bleeding to GI. If patient develops signs of obstruction from ventral hernia acutely, consider transfer to tertiary care center more acutely.     Brigid Re, [facility]  Surgery 03/21/2019, 11:21 AM Please see Amion for pager number during day hours 7:00am-4:30pm

## 2019-03-21 NOTE — Progress Notes (Addendum)
Patient ID: Gavin Arroyo, male   DOB: 07/14/1955, 64 y.o.   MRN: [id number]   Subjective: HD: 1  Overnight events:  Continues to have low, but stable, blood pressures in the 110-117/47-56. He was mildly hypothermic with temperature 96.2-97.3, but this has since resolved and most recent temperature is 98.5.   Gavin Arroyo was seen on rounds this morning and reports feeling well. He did express increased discomfort of his ventral hernia and increased bloating since the colonoscopy yesterday. He reports a bloody semi-formed bowel movement this morning with dark red blood, but says that the amount is less than it was previously. He agreed to leave any further bowel movements today in toilet to allow nurse to see color/consitency. We discussed the consult to general surgery for potential surgical resolution for his hernia and he expressed no concerns about this course of action.  He denies any shortness of breath, dizziness, nausea and pain with bowel movements.     Objective:  Vital signs in last 24 hours: Vitals:   03/20/19 1420 03/20/19 1458 03/20/19 2200 03/21/19 0400  BP: (!) 115/54 (!) 117/56 113/62 (!) 104/56  Pulse: 72 67 84 76  Resp: 14 14 18 18   Temp:  (!) 97.3 F (36.3 C) 98.6 F (37 C) 98.5 F (36.9 C)  TempSrc:  Oral Oral Oral  SpO2: 99% 97% 99% 97%  Weight:      Height:       Weight change: 0 kg  Intake/Output Summary (Last 24 hours) at 03/21/2019 1154 Last data filed at 03/21/2019 0900 Gross per 24 hour  Intake 2440 ml  Output 0 ml  Net 2440 ml  Physical Exam Cardiovascular:     Rate and Rhythm: Normal rate and regular rhythm.     Pulses: Normal pulses.     Heart sounds: Normal heart sounds.  Pulmonary:     Effort: Pulmonary effort is normal.     Breath sounds: Normal breath sounds.  Abdominal:     General: Bowel sounds are normal. There is distension.     Hernia: A hernia is present.  Skin:    General: Skin is warm and dry.  Neurological:     General: No  focal deficit present.     Mental Status: He is alert and oriented to person, place, and time.  Psychiatric:        Mood and Affect: Mood normal.    Pertinent labs:  CBC Latest Ref Rng & Units 03/20/2019 03/20/2019 03/20/2019  WBC 4.0 - 10.5 K/uL - - 8.2  Hemoglobin 13.0 - 17.0 g/dL 7.4(L) 6.6(LL) 5.9(LL)  Hematocrit 39.0 - 52.0 % 22.0(L) 19.6(L) 17.7(L)  Platelets 150 - 400 K/uL - - 133(L)   CMP Latest Ref Rng & Units 03/20/2019 03/19/2019 03/18/2019  Glucose 70 - 99 mg/dL 109(H) 124(H) 100(H)  BUN 8 - 23 mg/dL 14 15 7(L)  Creatinine 0.61 - 1.24 mg/dL 0.96 1.08 0.84  Sodium 135 - 145 mmol/L 140 138 139  Potassium 3.5 - 5.1 mmol/L 3.9 4.2 3.7  Chloride 98 - 111 mmol/L 115(H) 112(H) 113(H)  CO2 22 - 32 mmol/L 18(L) 16(L) 20(L)  Calcium 8.9 - 10.3 mg/dL 7.3(L) 7.5(L) 7.6(L)  Total Protein 6.5 - 8.1 g/dL 3.5(L) 3.8(L) 4.0(L)  Total Bilirubin 0.3 - 1.2 mg/dL 1.4(H) 0.9 1.2  Alkaline Phos 38 - 126 U/L 46 58 56  AST 15 - 41 U/L 39 54(H) 58(H)  ALT 0 - 44 U/L 23 26 31     Ref Range &  Units 1 d ago 2 d ago 3 d ago  Prothrombin Time 11.4 - 15.2 seconds 18.5High   18.2High   17.2High    INR 0.8 - 1.2 1.6High   1.5High  CM  1.4High    Colonoscopy: The scope could not be advanced beyond the mid transverse colon, as the lumen was narrowed. The entire transverse colon was within a large ventral hernia noted on CAT scan. Despite changing the patient's position to supine and applying abdominal pressure, scope could not be advanced beyond the luminal narrowing.  1 small polyp about 4 mm was found beyond the luminal narrowing, but was not removed because adequate positioning for polypectomy was not achieved.  2 areas of ulceration was found in the distal transverse colon at the site of previous polypectomy, 1 appeared to have a small visible vessel.  Endoclips were applied in both the sites.  1 small polyp was noted in the sigmoid colon, 6 mm in size, removed with hot snare and 2 endoclips were  placed to prevent bleeding post polypectomy.  Scattered diverticulosis was noted in the sigmoid and descending.  Surgical pathology for polyp removal:  In progress  EKG: Normal rate, normal sinus rhythm, low voltage in precordial leads.  Unchanged from most recent EKG 03/19/2019   Assessment/Plan:  Active Problems:   GI bleed   Acute blood loss anemia   Acute GI bleeding   Summary:  Gavin Arroyo is a 64 year old male with a past medical history of ETOH cirrhosis, esophageal varices,  Portal HTN gastropathy, and perforated ulcer recently hospitalized for diverticular bleed during which he underwent EGD/colonoscopy with multiple polypectomies (03/06-03/10) who was admitted for GI bleed and acute blood loss anemia determined to most likely be from recent polypectomy sites.   GI Bleed with acute blood loss anemia: Yesterday the Gavin Arroyo received 2 units of pRBCs. After 1 unit Hgb increased to 6.6 from 5.9, 2nd unit was transfused and Hgb increased to 7.4. The patient then underwent colonoscopy to determine source of his ongoing GI bleeding. During the procedure 2 areas of ulceration from recent polypectomy sites were identified, 1 of which had a small visible vessel. Both sites had endoclips applied and are believed to be the source of his most recent GI bleeding. A small polyp was also removed from sigmoid colon with hot snare and 2 endoclips to prevent future bleeding. This morning the patient reported a bloody bowel movement overnight, which could potentially be due to procedure yesterday. However, given the fact that the entire colon could not be visualized there is still potential for ongoing bleeding beyond the length of bowel able to be visualized.   - continue to monitor hgb and hct post transfusions - transfuse if hgb drops below 7  - continue telemetry monitoring  - continue protonix Q12hr 40mg   - monitor with daily CBC - GI following; we appreciate their recommendations  -  clear liquid diet for now  - surveillance colonoscopies due to removal of large tubular adenomas     Ventral hernia: The patient reports increased discomfort from his large ventral hernia and on colonoscopy yesterday it was not possible to advance past the transverse colon due to this hernia. General surgery was consulted this morning and will evaluate him for potential surgical resolution. We appreciate general surgery's consult and recommendations.   - general surgery recommending referral to tertiary care center for elective hernia repair given potential for significant complications. If patient develops signs of obstruction from ventral  hernia acutely, consider transfer to tertiary care center more acutely.  - continue zofran 4mg  Q6H PRN for nausea    Alcohol use disorder:  The patient has displayed no signs or symptoms of withdrawal since admsision  - continue to monitor for signs/symptoms of withdrawal - CIWA w/o ativan - continue thiamine- continue folate      LOS: 1 day   Gavin Arroyo, Medical Student 03/21/2019, 11:54 AM   Attestation for Student Documentation:  I personally was present and performed or re-performed the history, physical exam and medical decision-making activities of this service and have verified that the service and findings are accurately documented in the student's note.  Jean Rosenthal, MD 03/21/2019, 12:10 PM

## 2019-03-22 ENCOUNTER — Encounter: Payer: Self-pay | Admitting: *Deleted

## 2019-03-22 DIAGNOSIS — K439 Ventral hernia without obstruction or gangrene: Secondary | ICD-10-CM

## 2019-03-22 LAB — CBC WITH DIFFERENTIAL/PLATELET
Abs Immature Granulocytes: 0.04 10*3/uL (ref 0.00–0.07)
Basophils Absolute: 0 10*3/uL (ref 0.0–0.1)
Basophils Relative: 0 %
Eosinophils Absolute: 0.6 10*3/uL — ABNORMAL HIGH (ref 0.0–0.5)
Eosinophils Relative: 8 %
HCT: 18.8 % — ABNORMAL LOW (ref 39.0–52.0)
Hemoglobin: 6 g/dL — CL (ref 13.0–17.0)
Immature Granulocytes: 1 %
Lymphocytes Relative: 32 %
Lymphs Abs: 2.4 10*3/uL (ref 0.7–4.0)
MCH: 30.9 pg (ref 26.0–34.0)
MCHC: 31.9 g/dL (ref 30.0–36.0)
MCV: 96.9 fL (ref 80.0–100.0)
Monocytes Absolute: 0.9 10*3/uL (ref 0.1–1.0)
Monocytes Relative: 11 %
Neutro Abs: 3.6 10*3/uL (ref 1.7–7.7)
Neutrophils Relative %: 48 %
Platelets: 164 10*3/uL (ref 150–400)
RBC: 1.94 MIL/uL — ABNORMAL LOW (ref 4.22–5.81)
RDW: 18.8 % — ABNORMAL HIGH (ref 11.5–15.5)
WBC: 7.5 10*3/uL (ref 4.0–10.5)
nRBC: 0 % (ref 0.0–0.2)

## 2019-03-22 LAB — COMPREHENSIVE METABOLIC PANEL
ALT: 25 U/L (ref 0–44)
AST: 45 U/L — ABNORMAL HIGH (ref 15–41)
Albumin: 1.7 g/dL — ABNORMAL LOW (ref 3.5–5.0)
Alkaline Phosphatase: 46 U/L (ref 38–126)
Anion gap: 6 (ref 5–15)
BUN: 14 mg/dL (ref 8–23)
CO2: 20 mmol/L — ABNORMAL LOW (ref 22–32)
Calcium: 7.7 mg/dL — ABNORMAL LOW (ref 8.9–10.3)
Chloride: 114 mmol/L — ABNORMAL HIGH (ref 98–111)
Creatinine, Ser: 1.07 mg/dL (ref 0.61–1.24)
GFR calc Af Amer: >60 mL/min (ref >60)
GFR calc non Af Amer: >60 mL/min (ref >60)
Glucose, Bld: 100 mg/dL — ABNORMAL HIGH (ref 70–99)
Potassium: 3.8 mmol/L (ref 3.5–5.1)
Sodium: 140 mmol/L (ref 135–145)
Total Bilirubin: 0.8 mg/dL (ref 0.3–1.2)
Total Protein: 3.9 g/dL — ABNORMAL LOW (ref 6.5–8.1)

## 2019-03-22 LAB — CBC
HCT: 21.1 % — ABNORMAL LOW (ref 39.0–52.0)
Hemoglobin: 7 g/dL — ABNORMAL LOW (ref 13.0–17.0)
MCH: 31.4 pg (ref 26.0–34.0)
MCHC: 33.2 g/dL (ref 30.0–36.0)
MCV: 94.6 fL (ref 80.0–100.0)
Platelets: 162 10*3/uL (ref 150–400)
RBC: 2.23 MIL/uL — ABNORMAL LOW (ref 4.22–5.81)
RDW: 17.2 % — ABNORMAL HIGH (ref 11.5–15.5)
WBC: 6.3 10*3/uL (ref 4.0–10.5)
nRBC: 0 % (ref 0.0–0.2)

## 2019-03-22 LAB — MAGNESIUM: Magnesium: 1.9 mg/dL (ref 1.7–2.4)

## 2019-03-22 LAB — PREPARE RBC (CROSSMATCH)

## 2019-03-22 LAB — PHOSPHORUS: Phosphorus: 3.5 mg/dL (ref 2.5–4.6)

## 2019-03-22 MED ORDER — SODIUM CHLORIDE 0.9% IV SOLUTION: Freq: Once | INTRAVENOUS | Status: AC

## 2019-03-22 MED ORDER — LACTATED RINGERS IV BOLUS
500.0000 mL | Freq: Once | INTRAVENOUS | Status: AC
  Administered 2019-03-22: 500 mL via INTRAVENOUS

## 2019-03-22 NOTE — Progress Notes (Signed)
Subjective: HD#2 Events Overnight: Patient had a hemoglobin of 6.0. A single unit of PRBC was ordered for transfusion.  Patient was seen this morning on rounds.  Resting comfortably in bed.  States that he does have some abdominal pain, but otherwise feels well. He has not had any bowel movements recently.  Objective:  Vital signs in last 24 hours: Vitals:   03/21/19 0400 03/21/19 1459 03/21/19 2057 03/22/19 0535  BP: (!) 104/56 100/66 (!) 114/59 (!) 101/58  Pulse: 76 80 74 77  Resp: 18 16  18   Temp: 98.5 F (36.9 C) 98.6 F (37 C) 98.4 F (36.9 C) 98.3 F (36.8 C)  TempSrc: Oral Oral Oral Oral  SpO2: 97% 100% 96% 99%  Weight:      Height:       Supplemental O2: Room air CIWA score: 0 overnight  Physical Exam: Physical Exam  Constitutional: He is oriented to person, place, and time. No distress.  HENT:  Head: Normocephalic and atraumatic.  Eyes: EOM are normal.  Cardiovascular: Normal rate and intact distal pulses.  Pulmonary/Chest: Effort normal. No respiratory distress.  Abdominal: Soft. He exhibits no distension. There is abdominal tenderness.  Musculoskeletal:        General: Normal range of motion.     Cervical back: Normal range of motion.  Neurological: He is alert and oriented to person, place, and time.  Skin: Skin is warm and dry. He is not diaphoretic.    Filed Weights   03/19/19 1443 03/20/19 1232  Weight: 71.8 kg 71.8 kg     Intake/Output Summary (Last 24 hours) at 03/22/2019 0716 Last data filed at 03/21/2019 1300 Gross per 24 hour  Intake 1040 ml  Output --  Net 1040 ml    Risk Score:  N/A  Pertinent labs/Imaging: CBC Latest Ref Rng & Units 03/22/2019 03/20/2019 03/20/2019  WBC 4.0 - 10.5 K/uL 7.5 - -  Hemoglobin 13.0 - 17.0 g/dL 6.0(LL) 7.4(L) 6.6(LL)  Hematocrit 39.0 - 52.0 % 18.8(L) 22.0(L) 19.6(L)  Platelets 150 - 400 K/uL 164 - -    CMP Latest Ref Rng & Units 03/22/2019 03/20/2019 03/19/2019  Glucose 70 - 99 mg/dL 100(H) 109(H)  124(H)  BUN 8 - 23 mg/dL 14 14 15   Creatinine 0.61 - 1.24 mg/dL 1.07 0.96 1.08  Sodium 135 - 145 mmol/L 140 140 138  Potassium 3.5 - 5.1 mmol/L 3.8 3.9 4.2  Chloride 98 - 111 mmol/L 114(H) 115(H) 112(H)  CO2 22 - 32 mmol/L 20(L) 18(L) 16(L)  Calcium 8.9 - 10.3 mg/dL 7.7(L) 7.3(L) 7.5(L)  Total Protein 6.5 - 8.1 g/dL 3.9(L) 3.5(L) 3.8(L)  Total Bilirubin 0.3 - 1.2 mg/dL 0.8 1.4(H) 0.9  Alkaline Phos 38 - 126 U/L 46 46 58  AST 15 - 41 U/L 45(H) 39 54(H)  ALT 0 - 44 U/L 25 23 26     No results found.   Assessment/Plan:  Active Problems:   GI bleed   Acute blood loss anemia   Acute GI bleeding    Patient Summary: Gavin Arroyo is a 64 y.o. with pertinent PMH of ETOH cirrhosis, esophageal varices,  Portal HTN gastropathy, and perforated ulcer recently hospitalized for diverticular bleed during which he underwent EGD/colonoscopy with multiple polypectomies (03/06-03/10) who presented with recurrent hematochezia and admit for GI bleed on hospital day 2  #GI bleed with acute blood loss anemia Patient is now status post repeat colonoscopy which did show visible vessels from previous polypectomy and thought to be the source of the  GI bleed.  Endoclips were applied to the vessel.  Another polyp was removed and endoclips applied to prevent further blood loss.  Patient did have a low hemoglobin of 6 this morning and required 1 transfusion.  Posttransfusion H&H is pending.  -Continue Protonix 40 mg q12 hours -Appreciate GI recommendations. -Follow-up posttransfusion H&H - Fluid bolus as needed  #Ventral hernia Surgery evaluated the patient yesterday for possible surgical management of his ventral hernia.  Considering the patient's continued GI bleed, they do not believe that this patient is good candidate for surgical repair of his ventral hernia. -Follow-up in the outpatient setting  #Alcohol use disorder Patient does not have any visible signs of withdrawal -CIWA without  Ativan -Continue supplementing B12, folate, thiamine.   Diet: Clear liquid IVF: None,None VTE: SCDs Code: Full PT/OT recs: None TOC recs: noen   Dispo: Anticipated discharge pending.    Marianna Payment, D.O. [facility], PGY-1 Date 03/22/2019 Time 7:16 AM

## 2019-03-22 NOTE — Progress Notes (Signed)
Subjective: Patient states his last bowel movement was small in amount but brown in color.  Has not had any further rectal bleeding, however he was noted to have a drop in hemoglobin 6 and was ordered 1 unit PRBC transfusion.  Objective: Vital signs in last 24 hours: Temp:  [98.1 F (36.7 C)-98.4 F (36.9 C)] 98.1 F (36.7 C) (03/14 1113) Pulse Rate:  [74-78] 78 (03/14 1113) Resp:  [16-18] 16 (03/14 1113) BP: (93-115)/(55-63) 115/63 (03/14 1113) SpO2:  [96 %-99 %] 99 % (03/14 1113) Weight change:  Last BM Date: 03/21/19  PE: Lying on bed, appears comfortable, mild pallor GENERAL: Not in distress ABDOMEN: Large ventral hernia containing a loop of transverse colon, distended abdomen, normal active bowel sounds EXTREMITIES: No deformities  Lab Results: Results for orders placed or performed during the hospital encounter of 03/19/19 (from the past 48 hour(s))  Hemoglobin and hematocrit, blood     Status: Abnormal   Collection Time: 03/20/19 10:04 PM  Result Value Ref Range   Hemoglobin 7.4 (L) 13.0 - 17.0 g/dL   HCT 22.0 (L) 39.0 - 52.0 %    Comment: Performed at [facility], [address], [place], [place] [address]  CBC with Differential/Platelet     Status: Abnormal   Collection Time: 03/22/19  4:17 AM  Result Value Ref Range   WBC 7.5 4.0 - 10.5 K/uL   RBC 1.94 (L) 4.22 - 5.81 MIL/uL   Hemoglobin 6.0 (LL) 13.0 - 17.0 g/dL    Comment: REPEATED TO VERIFY THIS CRITICAL RESULT HAS VERIFIED AND BEEN CALLED TO N.IRISH,RN BY MELISSA BROGDON ON 03 14 2021 AT 0544, AND HAS BEEN READ BACK.     HCT 18.8 (L) 39.0 - 52.0 %   MCV 96.9 80.0 - 100.0 fL   MCH 30.9 26.0 - 34.0 pg   MCHC 31.9 30.0 - 36.0 g/dL   RDW 18.8 (H) 11.5 - 15.5 %   Platelets 164 150 - 400 K/uL   nRBC 0.0 0.0 - 0.2 %   Neutrophils Relative % 48 %   Neutro Abs 3.6 1.7 - 7.7 K/uL   Lymphocytes Relative 32 %   Lymphs Abs 2.4 0.7 - 4.0 K/uL   Monocytes Relative 11 %   Monocytes Absolute 0.9 0.1 - 1.0 K/uL   Eosinophils Relative 8 %   Eosinophils Absolute 0.6 (H) 0.0 - 0.5 K/uL   Basophils Relative 0 %   Basophils Absolute 0.0 0.0 - 0.1 K/uL   Immature Granulocytes 1 %   Abs Immature Granulocytes 0.04 0.00 - 0.07 K/uL    Comment: Performed at [facility] [address] [place], [place] [address]  Comprehensive metabolic panel     Status: Abnormal   Collection Time: 03/22/19  4:17 AM  Result Value Ref Range   Sodium 140 135 - 145 mmol/L   Potassium 3.8 3.5 - 5.1 mmol/L   Chloride 114 (H) 98 - 111 mmol/L   CO2 20 (L) 22 - 32 mmol/L   Glucose, Bld 100 (H) 70 - 99 mg/dL    Comment: Glucose reference range applies only to samples taken after fasting for at least 8 hours.   BUN 14 8 - 23 mg/dL   Creatinine, Ser 1.07 0.61 - 1.24 mg/dL   Calcium 7.7 (L) 8.9 - 10.3 mg/dL   Total Protein 3.9 (L) 6.5 - 8.1 g/dL   Albumin 1.7 (L) 3.5 - 5.0 g/dL   AST 45 (H) 15 - 41 U/L  ALT 25 0 - 44 U/L   Alkaline Phosphatase 46 38 - 126 U/L   Total Bilirubin 0.8 0.3 - 1.2 mg/dL   GFR calc non Af Amer >60 >60 mL/min   GFR calc Af Amer >60 >60 mL/min   Anion gap 6 5 - 15    Comment: Performed at [facility] [address], [place], [place] [address]  Magnesium     Status: None   Collection Time: 03/22/19  4:17 AM  Result Value Ref Range   Magnesium 1.9 1.7 - 2.4 mg/dL    Comment: Performed at [facility] [address], [place], [place] [address]  Phosphorus     Status: None   Collection Time: 03/22/19  4:17 AM  Result Value Ref Range   Phosphorus 3.5 2.5 - 4.6 mg/dL    Comment: Performed at [facility] [address], [place], [place] [address]  Prepare RBC     Status: None   Collection Time: 03/22/19  6:03 AM  Result Value Ref Range   Order Confirmation      ORDER PROCESSED BY BLOOD BANK Performed at [facility], [place] [address], [place], [place] [address]     Studies/Results: No results found.  Medications: I have reviewed the patient's current  medications.  Assessment: Rectal bleeding, most likely post polypectomy, large adenomatous polyps were removed on 03/16/2019. Repeat colonoscopy successful only up to mid transverse colon, endoclips were applied at the post polypectomy sites No further hematochezia however continues to drop hemoglobin level. EGD from 03/16/2019 showed grade 1 esophageal varices and portal hypertensive gastropathy Patient known to have cirrhosis based on CAT scan  Plan: On regular diet. If no further evidence of rectal bleeding, okay to discharge in a.m.Marland Kitchen Patient needs to follow-up as an outpatient for long-term management of cirrhosis. Patient will need surveillance colonoscopies as large tubular adenomas were removed, however this would not be possible until the large ventral hernia is corrected surgically. Please recall GI if needed.  Ronnette Juniper, MD 03/22/2019, 3:18 PM

## 2019-03-23 LAB — COMPREHENSIVE METABOLIC PANEL
ALT: 25 U/L (ref 0–44)
AST: 45 U/L — ABNORMAL HIGH (ref 15–41)
Albumin: 1.8 g/dL — ABNORMAL LOW (ref 3.5–5.0)
Alkaline Phosphatase: 56 U/L (ref 38–126)
Anion gap: 8 (ref 5–15)
BUN: 12 mg/dL (ref 8–23)
CO2: 20 mmol/L — ABNORMAL LOW (ref 22–32)
Calcium: 8 mg/dL — ABNORMAL LOW (ref 8.9–10.3)
Chloride: 111 mmol/L (ref 98–111)
Creatinine, Ser: 1.07 mg/dL (ref 0.61–1.24)
GFR calc Af Amer: >60 mL/min (ref >60)
GFR calc non Af Amer: >60 mL/min (ref >60)
Glucose, Bld: 109 mg/dL — ABNORMAL HIGH (ref 70–99)
Potassium: 3.9 mmol/L (ref 3.5–5.1)
Sodium: 139 mmol/L (ref 135–145)
Total Bilirubin: 0.8 mg/dL (ref 0.3–1.2)
Total Protein: 4.3 g/dL — ABNORMAL LOW (ref 6.5–8.1)

## 2019-03-23 LAB — TYPE AND SCREEN
ABO/RH(D): A POS
Antibody Screen: NEGATIVE
Unit division: 0
Unit division: 0
Unit division: 0
Unit division: 0
Unit division: 0

## 2019-03-23 LAB — CBC
HCT: 22.8 % — ABNORMAL LOW (ref 39.0–52.0)
HCT: 21.2 % — ABNORMAL LOW (ref 39.0–52.0)
Hemoglobin: 7.3 g/dL — ABNORMAL LOW (ref 13.0–17.0)
Hemoglobin: 6.7 g/dL — CL (ref 13.0–17.0)
MCH: 30.7 pg (ref 26.0–34.0)
MCH: 31.2 pg (ref 26.0–34.0)
MCHC: 32 g/dL (ref 30.0–36.0)
MCHC: 31.6 g/dL (ref 30.0–36.0)
MCV: 95.8 fL (ref 80.0–100.0)
MCV: 98.6 fL (ref 80.0–100.0)
Platelets: 183 10*3/uL (ref 150–400)
Platelets: 196 10*3/uL (ref 150–400)
RBC: 2.38 MIL/uL — ABNORMAL LOW (ref 4.22–5.81)
RBC: 2.15 MIL/uL — ABNORMAL LOW (ref 4.22–5.81)
RDW: 17.8 % — ABNORMAL HIGH (ref 11.5–15.5)
RDW: 18.1 % — ABNORMAL HIGH (ref 11.5–15.5)
WBC: 6.6 10*3/uL (ref 4.0–10.5)
WBC: 7.5 10*3/uL (ref 4.0–10.5)
nRBC: 0 % (ref 0.0–0.2)
nRBC: 0 % (ref 0.0–0.2)

## 2019-03-23 LAB — BPAM RBC
Blood Product Expiration Date: 202103292359
Blood Product Expiration Date: 202104072359
Blood Product Expiration Date: 202104102359
Blood Product Expiration Date: 202104102359
Blood Product Expiration Date: 202104112359
ISSUE DATE / TIME: 202103111711
ISSUE DATE / TIME: 202103112031
ISSUE DATE / TIME: 202103120607
ISSUE DATE / TIME: 202103121233
ISSUE DATE / TIME: 202103140817
Unit Type and Rh: 6200
Unit Type and Rh: 6200
Unit Type and Rh: 6200
Unit Type and Rh: 6200
Unit Type and Rh: 6200

## 2019-03-23 LAB — PREPARE RBC (CROSSMATCH)

## 2019-03-23 LAB — PROTIME-INR
INR: 1.3 — ABNORMAL HIGH (ref 0.8–1.2)
Prothrombin Time: 16.3 seconds — ABNORMAL HIGH (ref 11.4–15.2)

## 2019-03-23 LAB — APTT: aPTT: 32 seconds (ref 24–36)

## 2019-03-23 LAB — HEMOGLOBIN AND HEMATOCRIT, BLOOD
HCT: 23.7 % — ABNORMAL LOW (ref 39.0–52.0)
Hemoglobin: 7.5 g/dL — ABNORMAL LOW (ref 13.0–17.0)

## 2019-03-23 MED ORDER — SODIUM CHLORIDE 0.9% IV SOLUTION: Freq: Once | INTRAVENOUS | Status: AC

## 2019-03-23 NOTE — Progress Notes (Signed)
Patient ID: Gavin Arroyo, male   DOB: 07/29/1955, 64 y.o.   MRN: [id number]   Subjective: HD: 3  Overnight Events: No acute events   Gavin Arroyo was seen on rounds this morning on rounds and reports feeling well. He does report approximately 3 episodes of melena over the past 24 hours. Denies any abdominal pain, shortness of breath, and dizziness. We discussed with him the importance of following up as an outpatient and establishing a PCP and following up for resolution of his abdominal hernia. He agreed to this plan and requested assistance getting set up with a PCP near [place].   Objective:  Vital signs in last 24 hours: Vitals:   03/22/19 1113 03/22/19 1548 03/22/19 2031 03/23/19 0507  BP: 115/63 (!) 100/56 (!) 113/56 (!) 98/57  Pulse: 78 75 81 77  Resp: 16 16 20 20   Temp: 98.1 F (36.7 C) 98.5 F (36.9 C) 98.2 F (36.8 C) 98.2 F (36.8 C)  TempSrc: Oral Oral Oral   SpO2: 99% 100% 98% 97%  Weight:      Height:       Supplemental O2: Room air CIWA score: 0 overnight  Weight change:   Intake/Output Summary (Last 24 hours) at 03/23/2019 1107 Last data filed at 03/22/2019 1800 Gross per 24 hour  Intake 915 ml  Output 304 ml  Net 611 ml   Physical Exam Constitutional:      Appearance: Normal appearance.  Cardiovascular:     Rate and Rhythm: Normal rate and regular rhythm.     Pulses: Normal pulses.     Heart sounds: Normal heart sounds.  Pulmonary:     Effort: Pulmonary effort is normal.     Breath sounds: Normal breath sounds.  Abdominal:     General: Bowel sounds are normal.     Palpations: Abdomen is soft.     Tenderness: There is no abdominal tenderness.  Skin:    General: Skin is warm and dry.  Neurological:     General: No focal deficit present.     Mental Status: He is alert and oriented to person, place, and time.  Psychiatric:        Mood and Affect: Mood normal.        Behavior: Behavior normal.    CBC Latest Ref Rng & Units 03/23/2019 03/22/2019  03/22/2019  WBC 4.0 - 10.5 K/uL 6.6 6.3 7.5  Hemoglobin 13.0 - 17.0 g/dL 7.3(L) 7.0(L) 6.0(LL)  Hematocrit 39.0 - 52.0 % 22.8(L) 21.1(L) 18.8(L)  Platelets 150 - 400 K/uL 183 162 164    Ref Range & Units 03:18 3 d ago 9 d ago  aPTT 24 - 36 seconds 32  29 CM  31 CM     Ref Range & Units 03:18 3 d ago 4 d ago  Prothrombin Time 11.4 - 15.2 seconds 16.3High   18.5High   18.2High    INR 0.8 - 1.2 1.3High   1.6High  CM  1.5High  CM    CMP Latest Ref Rng & Units 03/23/2019 03/22/2019 03/20/2019  Glucose 70 - 99 mg/dL 109(H) 100(H) 109(H)  BUN 8 - 23 mg/dL 12 14 14   Creatinine 0.61 - 1.24 mg/dL 1.07 1.07 0.96  Sodium 135 - 145 mmol/L 139 140 140  Potassium 3.5 - 5.1 mmol/L 3.9 3.8 3.9  Chloride 98 - 111 mmol/L 111 114(H) 115(H)  CO2 22 - 32 mmol/L 20(L) 20(L) 18(L)  Calcium 8.9 - 10.3 mg/dL 8.0(L) 7.7(L) 7.3(L)  Total Protein 6.5 -  8.1 g/dL 4.3(L) 3.9(L) 3.5(L)  Total Bilirubin 0.3 - 1.2 mg/dL 0.8 0.8 1.4(H)  Alkaline Phos 38 - 126 U/L 56 46 46  AST 15 - 41 U/L 45(H) 45(H) 39  ALT 0 - 44 U/L 25 25 23     Assessment/Plan:  Active Problems:   GI bleed   Acute blood loss anemia   Acute GI bleeding  Summary:  Gavin Arroyo is a 64 year old with a past medical history of ETOH cirrhosis, esophageal varices, and portal HTN gastropathy recently hospitalized for diverticular bleeding during which he underwent EGD/colonscopy with multiple polypectomies (03/06-03/10) who presented with hematochezia and admitted for GI bleed determined by repeat colonoscopy to most likely have originated from recent polypectomy sites.   GI bleed with acute blood loss anemia: The patient had repeat colonoscopy with visualization of ulcerations in recent polypectomy sites and one visible vessel. Endoclips were applied to the vessel. Another polyp was also removed during this repeat colonoscopy. The patient has not received a transfusion since yesterday morning. Yesterday's post transfusion Hgb was 7.0, this morning he was  stable still with HGB 7.3. If he remains with a stable Hgb through out the day he will be able to be discharged this evening.   - CBC this afternoon  - follow up with out patient GI   Ventral hernia:  Surgical evaluation recommending voluntary surgical resolution at a tertiary care center. Given ongoing GI bleeding and portal HTN from cirrhosis he is at higher risk of complications.    Alcohol use disorder:  No signs or symptoms of withdrawal since admission.   - CIWA with out ativan  Continue supplementing B12, folate, and thiamine   Diet: Clear liquid IVF: None,None VTE: SCDs Code: Full PT/OT recs: None TOC recs: none  Dispo: Anticipated discharge this afternoon if Hgb remains stable.      LOS: 3 days   Mikael Spray, Medical Student 03/23/2019, 11:07 AM

## 2019-03-23 NOTE — Progress Notes (Signed)
1256- Critical value received Hemoglobin 6.7. Will notify MD

## 2019-03-23 NOTE — TOC Initial Note (Signed)
Transition of Care [facility]) - Initial/Assessment Note    Patient Details  Name: Gavin Arroyo MRN: [id number] Date of Birth: Oct 06, 1955  [facility] CM/SW Contact:    Marilu Favre, RN Phone Number: 03/23/2019, 11:07 AM  Clinical Narrative:                 Confirmed face sheet information. Patient discharged home and readmitted recently. Changed pharmacy to [facility] . Patient has [facility] information to call to schedule appointment./ Patient has transportation to appointments. Will continue to follow.   Expected Discharge Plan: Home/Self Care Barriers to Discharge: Continued Medical Work up   Patient Goals and CMS Choice Patient states their goals for this hospitalization and ongoing recovery are:: to return to home CMS Medicare.gov Compare Post Acute Care list provided to:: Patient Choice offered to / list presented to : NA  Expected Discharge Plan and Services Expected Discharge Plan: Home/Self Care In-house Referral: Financial Counselor Discharge Planning Services: CM Consult, Medication Assistance, [facility], [facility]   Living arrangements for the past 2 months: Single Family Home                 DME Arranged: N/A         HH Arranged: NA          Prior Living Arrangements/Services Living arrangements for the past 2 months: Single Family Home Lives with:: Other (Comment)(ex girl friend and her brother)   Do you feel safe going back to the place where you live?: Yes      Need for Family Participation in Patient Care: Yes (Comment) Care giver support system in place?: Yes (comment)   Criminal Activity/Legal Involvement Pertinent to Current Situation/Hospitalization: No - Comment as needed  Activities of Daily Living Home Assistive Devices/Equipment: Eyeglasses ADL Screening (condition at time of admission) Patient's cognitive ability adequate to safely complete daily activities?: Yes Is the patient deaf or  have difficulty hearing?: No Does the patient have difficulty seeing, even when wearing glasses/contacts?: No Does the patient have difficulty concentrating, remembering, or making decisions?: No Patient able to express need for assistance with ADLs?: Yes Does the patient have difficulty dressing or bathing?: No Independently performs ADLs?: Yes (appropriate for developmental age) Does the patient have difficulty walking or climbing stairs?: No Weakness of Legs: None Weakness of Arms/Hands: None  Permission Sought/Granted   Permission granted to share information with : No              Emotional Assessment Appearance:: Appears stated age Attitude/Demeanor/Rapport: Engaged Affect (typically observed): Accepting Orientation: : Oriented to Situation, Oriented to  Time, Oriented to Place, Oriented to Self Alcohol / Substance Use: Not Applicable    Admission diagnosis:  GI bleed [K92.2] Acute blood loss anemia [D62] Acute GI bleeding [K92.2] Patient Active Problem List   Diagnosis Date Noted  . Acute GI bleeding 03/20/2019  . Acute blood loss anemia 03/19/2019  . Symptomatic anemia   . Hepatic lesion 03/14/2019  . GI bleed 03/14/2019  . NAGMA 03/14/2019  . Alcohol use disorder, moderate, dependence ([place]) 03/14/2019  . AKI (acute kidney injury) ([place]) 03/14/2019   PCP:  Patient, No Pcp Per Pharmacy:   [id number] - [place], [place] [id number] [place] [id number] Phone: [phone] Fax: [phone]  [facility] [place], [place]  [place] [address] Phone: [phone] Fax: [phone]     Social Determinants of Health (SDOH) Interventions    Readmission Risk Interventions No flowsheet data found.

## 2019-03-23 NOTE — Plan of Care (Signed)

## 2019-03-24 DIAGNOSIS — K7460 Unspecified cirrhosis of liver: Secondary | ICD-10-CM | POA: Diagnosis present

## 2019-03-24 DIAGNOSIS — K7031 Alcoholic cirrhosis of liver with ascites: Secondary | ICD-10-CM

## 2019-03-24 LAB — COMPREHENSIVE METABOLIC PANEL
ALT: 24 U/L (ref 0–44)
AST: 42 U/L — ABNORMAL HIGH (ref 15–41)
Albumin: 1.6 g/dL — ABNORMAL LOW (ref 3.5–5.0)
Alkaline Phosphatase: 50 U/L (ref 38–126)
Anion gap: 7 (ref 5–15)
BUN: 13 mg/dL (ref 8–23)
CO2: 20 mmol/L — ABNORMAL LOW (ref 22–32)
Calcium: 8 mg/dL — ABNORMAL LOW (ref 8.9–10.3)
Chloride: 112 mmol/L — ABNORMAL HIGH (ref 98–111)
Creatinine, Ser: 0.92 mg/dL (ref 0.61–1.24)
GFR calc Af Amer: >60 mL/min (ref >60)
GFR calc non Af Amer: >60 mL/min (ref >60)
Glucose, Bld: 104 mg/dL — ABNORMAL HIGH (ref 70–99)
Potassium: 4.1 mmol/L (ref 3.5–5.1)
Sodium: 139 mmol/L (ref 135–145)
Total Bilirubin: 1.2 mg/dL (ref 0.3–1.2)
Total Protein: 4 g/dL — ABNORMAL LOW (ref 6.5–8.1)

## 2019-03-24 LAB — CBC
HCT: 20.4 % — ABNORMAL LOW (ref 39.0–52.0)
Hemoglobin: 6.5 g/dL — CL (ref 13.0–17.0)
MCH: 30.2 pg (ref 26.0–34.0)
MCHC: 31.9 g/dL (ref 30.0–36.0)
MCV: 94.9 fL (ref 80.0–100.0)
Platelets: 185 10*3/uL (ref 150–400)
RBC: 2.15 MIL/uL — ABNORMAL LOW (ref 4.22–5.81)
RDW: 18.3 % — ABNORMAL HIGH (ref 11.5–15.5)
WBC: 7.7 10*3/uL (ref 4.0–10.5)
nRBC: 0 % (ref 0.0–0.2)

## 2019-03-24 LAB — HEMOGLOBIN AND HEMATOCRIT, BLOOD
HCT: 22.7 % — ABNORMAL LOW (ref 39.0–52.0)
Hemoglobin: 7.5 g/dL — ABNORMAL LOW (ref 13.0–17.0)

## 2019-03-24 LAB — PROTIME-INR
INR: 1.4 — ABNORMAL HIGH (ref 0.8–1.2)
Prothrombin Time: 16.8 seconds — ABNORMAL HIGH (ref 11.4–15.2)

## 2019-03-24 LAB — PREPARE RBC (CROSSMATCH)

## 2019-03-24 LAB — SURGICAL PATHOLOGY

## 2019-03-24 MED ORDER — VITAMIN B-12 1000 MCG PO TABS
1000.0000 ug | ORAL_TABLET | Freq: Every day | ORAL | Status: DC
  Administered 2019-03-24 – 2019-03-28 (×5): 1000 ug via ORAL
  Filled 2019-03-24 (×5): qty 1

## 2019-03-24 MED ORDER — SODIUM CHLORIDE 0.9% IV SOLUTION: Freq: Once | INTRAVENOUS | Status: AC

## 2019-03-24 NOTE — Progress Notes (Signed)
Subjective: Called to see patient back for bleeding. 3 bowel movements yesterday, two firm, black. No abdominal pain or nausea/vomiting.  Objective: Vital signs in last 24 hours: Temp:  [98 F (36.7 C)-99.6 F (37.6 C)] 98.5 F (36.9 C) (03/16 0917) Pulse Rate:  [72-78] 72 (03/16 0917) Resp:  [16-19] 16 (03/16 0917) BP: (94-126)/(52-73) 94/52 (03/16 0917) SpO2:  [96 %-100 %] 97 % (03/16 0917) Weight change:  Last BM Date: 03/23/19  PE: GEN:  NAD ABD:  Large ventral hernia, soft, non-tender  Lab Results: CBC    Component Value Date/Time   WBC 7.7 03/24/2019 0649   RBC 2.15 (L) 03/24/2019 0649   HGB 6.5 (LL) 03/24/2019 0649   HCT 20.4 (L) 03/24/2019 0649   HCT 13.4 (LL) 03/14/2019 1750   PLT 185 03/24/2019 0649   MCV 94.9 03/24/2019 0649   MCH 30.2 03/24/2019 0649   MCHC 31.9 03/24/2019 0649   RDW 18.3 (H) 03/24/2019 0649   LYMPHSABS 2.4 03/22/2019 0417   MONOABS 0.9 03/22/2019 0417   EOSABS 0.6 (H) 03/22/2019 0417   BASOSABS 0.0 03/22/2019 0417   CMP     Component Value Date/Time   NA 139 03/24/2019 0649   K 4.1 03/24/2019 0649   CL 112 (H) 03/24/2019 0649   CO2 20 (L) 03/24/2019 0649   GLUCOSE 104 (H) 03/24/2019 0649   BUN 13 03/24/2019 0649   CREATININE 0.92 03/24/2019 0649   CALCIUM 8.0 (L) 03/24/2019 0649   PROT 4.0 (L) 03/24/2019 0649   ALBUMIN 1.6 (L) 03/24/2019 0649   AST 42 (H) 03/24/2019 0649   ALT 24 03/24/2019 0649   ALKPHOS 50 03/24/2019 0649   BILITOT 1.2 03/24/2019 0649   GFRNONAA >60 03/24/2019 0649   GFRAA >60 03/24/2019 0649   Assessment:  1.  Recurrent GI bleeding.  Prior endoscopy and colonoscopy.  Suspect post-polypectomy bleeding in proximal colon (large polyps removed 03/16/19 in proximal colon; unfortunately repeat colonoscopy due to ventral hernia unable to reach the proximal colon). 2.  Acute blood loss anemia.  Recurrent transfusions.  Plan:  1.  Difficult case; would pursue supportive care for now and hope bleeding and  transfusion requirements stop on their own over the next few days. 2.  If bleeding persists over the next few days, options are yet another colonoscopy attempt here versus transfer to tertiary center. 3.  [facility] will follow.   Landry Dyke 03/24/2019, 11:30 AM   Cell [phone] If no answer or after 5 PM call [phone]

## 2019-03-24 NOTE — Progress Notes (Signed)
CRITICAL VALUE ALERT  Critical Value: Hemoglobin 6.5  Date & Time Notied:  03/24/19  Provider Notified: [phone]  Orders Received/Actions taken:  Paged Dr. Marianna Payment and Dr. Eileen Stanford,                                                       awaiting call back

## 2019-03-24 NOTE — Progress Notes (Addendum)
Subjective: HD: 4   Overnight events: No acute events  Gavin Arroyo was seen on rounds this morning and reports feeling alright this morning and appears more tired than he the previous few days. He reports that he has been sleeping a lot. Endorses three dark bowel movements yesterday, and no bowel movements so far this morning. No dizziness, shortness of breath, orthostatic symptoms, weakness, hemoptysis. He does report that he feels more bloated.   Objective:  Vital signs in last 24 hours: Vitals:   03/23/19 2138 03/24/19 0505 03/24/19 0902 03/24/19 0917  BP: 126/65 104/60 (!) 101/56 (!) 94/52  Pulse: 76 78 75 72  Resp: 16 18 18 16   Temp: 98.5 F (36.9 C) 98.4 F (36.9 C) 98.6 F (37 C) 98.5 F (36.9 C)  TempSrc: Oral Oral Oral Oral  SpO2: 100% 99% 97% 97%  Weight:      Height:       Weight change:   Intake/Output Summary (Last 24 hours) at 03/24/2019 1059 Last data filed at 03/24/2019 0902 Gross per 24 hour  Intake 130 ml  Output   Net 130 ml   Physical Exam Cardiovascular:     Rate and Rhythm: Normal rate and regular rhythm.  Pulmonary:     Effort: Pulmonary effort is normal.     Breath sounds: Normal breath sounds.  Abdominal:     General: There is distension.     Palpations: Abdomen is soft.     Tenderness: There is no abdominal tenderness.     Hernia: A hernia is present. Hernia is present in the ventral area.     Comments: Ascites was noted using bedside butterfly ultrasound.   Skin:    General: Skin is warm and dry.     Coloration: Skin is pale.  Neurological:     General: No focal deficit present.     Mental Status: He is alert.    Child's Pugh:  9; Child's class B   MELD Score: 10 6.0% estimated 3 month mortality   CBC Latest Ref Rng & Units 03/24/2019 03/23/2019 03/23/2019  WBC 4.0 - 10.5 K/uL 7.7 - 7.5  Hemoglobin 13.0 - 17.0 g/dL 6.5(LL) 7.5(L) 6.7(LL)  Hematocrit 39.0 - 52.0 % 20.4(L) 23.7(L) 21.2(L)  Platelets 150 - 400 K/uL 185 - 196    PT:  03/15  16.3 03/12  18.5  INR: 03/15  1.3 03/12  1.6   Ref Range & Units 1 d ago 4 d ago 10 d ago  aPTT 24 - 36 seconds 32  29 CM  31 CM    CMP Latest Ref Rng & Units 03/24/2019 03/23/2019 03/22/2019  Glucose 70 - 99 mg/dL 104(H) 109(H) 100(H)  BUN 8 - 23 mg/dL 13 12 14   Creatinine 0.61 - 1.24 mg/dL 0.92 1.07 1.07  Sodium 135 - 145 mmol/L 139 139 140  Potassium 3.5 - 5.1 mmol/L 4.1 3.9 3.8  Chloride 98 - 111 mmol/L 112(H) 111 114(H)  CO2 22 - 32 mmol/L 20(L) 20(L) 20(L)  Calcium 8.9 - 10.3 mg/dL 8.0(L) 8.0(L) 7.7(L)  Total Protein 6.5 - 8.1 g/dL 4.0(L) 4.3(L) 3.9(L)  Total Bilirubin 0.3 - 1.2 mg/dL 1.2 0.8 0.8  Alkaline Phos 38 - 126 U/L 50 56 46  AST 15 - 41 U/L 42(H) 45(H) 45(H)  ALT 0 - 44 U/L 24 25 25     Assessment/Plan:  Principal Problem:   Acute GI bleeding Active Problems:   Acute blood loss anemia   Hepatic cirrhosis (HCC)  Summary:  Gavin Arroyo is a 64 year old male with chronic liver disease due to alcohol use disorder complicated by grade 1 esophageal varices and portal hypertensive gastropathy now hospital day #4 with persistent slow GI bleeding.    GI Bleed with acute blood loss anemia:  The patient has had ongoing slow GI bleeding with minimal improvement since admission. Hematochezia likely was due to post-polypectomy bleeding, this was treated with a clip, now transitioned to melena which suggests a new upper GI source.  Hgb at 6.5g this morning and to receive another transfusion. GI was re-consulted about about further management and the possibility of capsular endoscopy.  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 On exam today there was new ascites  present, evaluated with bedside butterfly ultrasound.  - Start spironolactone 50mg  daily - Start Lasix 20mg  daily  Ventral hernia:  Patient has a large ventral hernia containing the majority of the transverse colon. The hernia is not currently incarcerated or strangulated; there are no signs of obstruction. General surgery was consulted and not recommending intervention at this time due to potential for complications given decompensated cirrhosis.   Alcohol use disorder:  The patient has a history of significant alcohol use, approximately 1 pint a day. He has not had alcohol since his admission on 03/06 and has showed no signs or symptoms of withdrawal.  - continue supplementing B12 134mcg daily, folate 1mg  daily, and thiamine 100mg  daily   Diet: Clear liquid IVF: None,None VTE: SCDs, no lovenox due to ongoing slow GI bleeding Code: Full PT/OT recs: None TOC recs: none    LOS: 4 days   Mikael Spray, Medical Student 03/24/2019, 10:59 AM

## 2019-03-25 LAB — CBC
HCT: 21.2 % — ABNORMAL LOW (ref 39.0–52.0)
HCT: 24.5 % — ABNORMAL LOW (ref 39.0–52.0)
Hemoglobin: 6.9 g/dL — CL (ref 13.0–17.0)
Hemoglobin: 7.9 g/dL — ABNORMAL LOW (ref 13.0–17.0)
MCH: 30.9 pg (ref 26.0–34.0)
MCH: 30.2 pg (ref 26.0–34.0)
MCHC: 32.5 g/dL (ref 30.0–36.0)
MCHC: 32.2 g/dL (ref 30.0–36.0)
MCV: 95.1 fL (ref 80.0–100.0)
MCV: 93.5 fL (ref 80.0–100.0)
Platelets: 189 10*3/uL (ref 150–400)
Platelets: 194 10*3/uL (ref 150–400)
RBC: 2.23 MIL/uL — ABNORMAL LOW (ref 4.22–5.81)
RBC: 2.62 MIL/uL — ABNORMAL LOW (ref 4.22–5.81)
RDW: 17.9 % — ABNORMAL HIGH (ref 11.5–15.5)
RDW: 17.8 % — ABNORMAL HIGH (ref 11.5–15.5)
WBC: 8.5 10*3/uL (ref 4.0–10.5)
WBC: 7.5 10*3/uL (ref 4.0–10.5)
nRBC: 0 % (ref 0.0–0.2)
nRBC: 0 % (ref 0.0–0.2)

## 2019-03-25 LAB — COMPREHENSIVE METABOLIC PANEL
ALT: 25 U/L (ref 0–44)
AST: 43 U/L — ABNORMAL HIGH (ref 15–41)
Albumin: 1.6 g/dL — ABNORMAL LOW (ref 3.5–5.0)
Alkaline Phosphatase: 54 U/L (ref 38–126)
Anion gap: 8 (ref 5–15)
BUN: 13 mg/dL (ref 8–23)
CO2: 18 mmol/L — ABNORMAL LOW (ref 22–32)
Calcium: 8.1 mg/dL — ABNORMAL LOW (ref 8.9–10.3)
Chloride: 113 mmol/L — ABNORMAL HIGH (ref 98–111)
Creatinine, Ser: 0.97 mg/dL (ref 0.61–1.24)
GFR calc Af Amer: >60 mL/min (ref >60)
GFR calc non Af Amer: >60 mL/min (ref >60)
Glucose, Bld: 97 mg/dL (ref 70–99)
Potassium: 3.9 mmol/L (ref 3.5–5.1)
Sodium: 139 mmol/L (ref 135–145)
Total Bilirubin: 1.1 mg/dL (ref 0.3–1.2)
Total Protein: 4 g/dL — ABNORMAL LOW (ref 6.5–8.1)

## 2019-03-25 LAB — PROTIME-INR
INR: 1.4 — ABNORMAL HIGH (ref 0.8–1.2)
Prothrombin Time: 16.8 seconds — ABNORMAL HIGH (ref 11.4–15.2)

## 2019-03-25 LAB — PREPARE RBC (CROSSMATCH)

## 2019-03-25 MED ORDER — SPIRONOLACTONE 100 MG PO TABS: 100.0000 mg | ORAL_TABLET | Freq: Every day | ORAL | Status: DC

## 2019-03-25 MED ORDER — FUROSEMIDE 40 MG PO TABS: 40.0000 mg | ORAL_TABLET | Freq: Every day | ORAL | Status: DC

## 2019-03-25 MED ORDER — SPIRONOLACTONE 100 MG PO TABS
100.0000 mg | ORAL_TABLET | Freq: Every day | ORAL | Status: DC
  Administered 2019-03-25 – 2019-03-28 (×4): 100 mg via ORAL
  Filled 2019-03-25 (×4): qty 1

## 2019-03-25 MED ORDER — PHYTONADIONE 5 MG PO TABS
5.0000 mg | ORAL_TABLET | Freq: Once | ORAL | Status: AC
  Administered 2019-03-25: 5 mg via ORAL
  Filled 2019-03-25: qty 1

## 2019-03-25 MED ORDER — FUROSEMIDE 40 MG PO TABS
40.0000 mg | ORAL_TABLET | Freq: Every day | ORAL | Status: DC
  Administered 2019-03-25 – 2019-03-28 (×4): 40 mg via ORAL
  Filled 2019-03-25 (×4): qty 1

## 2019-03-25 MED ORDER — FUROSEMIDE 20 MG PO TABS: 20.0000 mg | ORAL_TABLET | Freq: Every day | ORAL | Status: DC

## 2019-03-25 MED ORDER — SPIRONOLACTONE 25 MG PO TABS: 50.0000 mg | ORAL_TABLET | Freq: Every day | ORAL | Status: DC

## 2019-03-25 MED ORDER — SODIUM CHLORIDE 0.9% IV SOLUTION: Freq: Once | INTRAVENOUS | Status: AC

## 2019-03-25 NOTE — Progress Notes (Addendum)
Received critical lab result hemoglobin--6.9 notified on call Dr.Lanier and received new order to give 1 unit PRBC.

## 2019-03-25 NOTE — Progress Notes (Addendum)
Patient ID: IMRAAN STICH, male   DOB: 1955/05/18, 64 y.o.   MRN: [id number]   Subjective: HD: Troy was seen on rounds this morning and reports feeling well. He reports having two bowel movements yesterday, both of which were very dark almost black in color. He reports no dizziness, weakness, orthostatic symptoms, shortness of breath. He does state that he still feels bloated, but no more so than yesterday.     Objective:  Vital signs in last 24 hours: Vitals:   03/24/19 2029 03/25/19 0422 03/25/19 0454 03/25/19 0658  BP: (!) 103/52 111/65 (!) 102/56 105/62  Pulse: 77 79 73 74  Resp: 18 18 18 18   Temp: 98.1 F (36.7 C) 98.2 F (36.8 C) 98.2 F (36.8 C) 98.2 F (36.8 C)  TempSrc: Oral Oral Oral Oral  SpO2: 100% 100% 97% 98%  Weight:      Height:        Physical Exam Neck:     Vascular: No JVD.  Cardiovascular:     Rate and Rhythm: Normal rate and regular rhythm.     Pulses: Normal pulses.     Heart sounds: Normal heart sounds.  Abdominal:     General: There is distension.     Hernia: A hernia is present.     Comments: Dullness to percussion on abdomen present on flank with patient lying down   Skin:    General: Skin is warm and dry.  Neurological:     Mental Status: He is alert and oriented to person, place, and time.  Psychiatric:        Mood and Affect: Mood normal.   No asterixis.   CBC Latest Ref Rng & Units 03/25/2019 03/24/2019 03/24/2019  WBC 4.0 - 10.5 K/uL 8.5 - 7.7  Hemoglobin 13.0 - 17.0 g/dL 6.9(LL) 7.5(L) 6.5(LL)  Hematocrit 39.0 - 52.0 % 21.2(L) 22.7(L) 20.4(L)  Platelets 150 - 400 K/uL 189 - 185   CMP Latest Ref Rng & Units 03/25/2019 03/24/2019 03/23/2019  Glucose 70 - 99 mg/dL 97 104(H) 109(H)  BUN 8 - 23 mg/dL 13 13 12   Creatinine 0.61 - 1.24 mg/dL 0.97 0.92 1.07  Sodium 135 - 145 mmol/L 139 139 139  Potassium 3.5 - 5.1 mmol/L 3.9 4.1 3.9  Chloride 98 - 111 mmol/L 113(H) 112(H) 111  CO2 22 - 32 mmol/L 18(L) 20(L) 20(L)  Calcium 8.9 - 10.3  mg/dL 8.1(L) 8.0(L) 8.0(L)  Total Protein 6.5 - 8.1 g/dL 4.0(L) 4.0(L) 4.3(L)  Total Bilirubin 0.3 - 1.2 mg/dL 1.1 1.2 0.8  Alkaline Phos 38 - 126 U/L 54 50 56  AST 15 - 41 U/L 43(H) 42(H) 45(H)  ALT 0 - 44 U/L 25 24 25    INR: 03/17  1.4 03/16  1.4 03/15  1.3  Assessment/Plan:  Principal Problem:   Acute GI bleeding Active Problems:   Acute blood loss anemia   Hepatic cirrhosis ([place])  Summary: Travarius Bacher is a 64 year old male with chronic liver disease due to alcohol use disorder complicated by grade 1 esophageal varices and portal hypertensive gastropathy currently on hospital day 5 with a persistent slow GI bleed.    GI bleed with acute blood loss anemia:  The patient has an ongoing slow GI bleed with little improvement since admission. Initial presentation with hematochezia was likely due to post-polypectomy bleeding, which was tereated with a clip. The patient now has melena suggestive of an upper GI bleed. Hgb at 6.9 overnight and to receive another  transfusion. Contacted [facility] yesterday, but unable to accept transfer due to poor bed availability.  - [facility] contacted about transfer for advanced endoscopy and hepatology consult; awaiting call back - receiving pRBC transfusion this AM; f/u with CBC this afternoon  - continue protonix 40mg  IV daily   Decompensated cirrhosis w/ascites: Bedside butterfly ultrasound yesterday visualized new ascites. On exam today dullness was noted with abdominal percussion, likely due to the presence of this ascites. No encephalopahy so far.  - Spironolactone 100mg  daily - Lasix 40mg  daily  - Add protein supplements to diet - INR a little elevated today at 1.4, will give oral vitamin k 5mg  x 3 days - Not requiring lactulose at this time.  Ventral hernia:  Patient has a large ventral hernia containing the majority of the transverse colon. The hernia is not currently incarcerated or strangulated; there are no signs of obstruction. General surgery was  consulted and not recommending intervention at this time due to potential for complications given decompensated cirrhosis.   Alcohol use disorder:  The patient has a history of significant alcohol use, approximately 1 pint a day. He has not had alcohol since his admission on 03/06 and has showed no signs or symptoms of withdrawal.  - continue supplementing folate 1mg  daily and thiamine 100mg  dily  Diet: High protein diet IVF: None VTE: SCDs, no lovenox due to ongoing slow GI bleeding. Encourage ambulation.  Code: Full PT/OT recs: None TOC recs: none   LOS: 5 days   Mikael Spray, Medical Student 03/25/2019, 10:57 AM

## 2019-03-25 NOTE — Progress Notes (Signed)
Case discussed with primary team.  Patient having ongoing black stools and decrease Hgb and ongoing transfusion requirement.  Transfer to tertiary center for further work-up and evaluation is in progress.  [facility] will sign-off; please call us back with any further questions/concerns.

## 2019-03-26 LAB — CBC
HCT: 22.4 % — ABNORMAL LOW (ref 39.0–52.0)
HCT: 22.3 % — ABNORMAL LOW (ref 39.0–52.0)
Hemoglobin: 7.3 g/dL — ABNORMAL LOW (ref 13.0–17.0)
Hemoglobin: 7 g/dL — ABNORMAL LOW (ref 13.0–17.0)
MCH: 30.9 pg (ref 26.0–34.0)
MCH: 30 pg (ref 26.0–34.0)
MCHC: 32.6 g/dL (ref 30.0–36.0)
MCHC: 31.4 g/dL (ref 30.0–36.0)
MCV: 94.9 fL (ref 80.0–100.0)
MCV: 95.7 fL (ref 80.0–100.0)
Platelets: 199 10*3/uL (ref 150–400)
Platelets: 220 10*3/uL (ref 150–400)
RBC: 2.36 MIL/uL — ABNORMAL LOW (ref 4.22–5.81)
RBC: 2.33 MIL/uL — ABNORMAL LOW (ref 4.22–5.81)
RDW: 18.1 % — ABNORMAL HIGH (ref 11.5–15.5)
RDW: 18.1 % — ABNORMAL HIGH (ref 11.5–15.5)
WBC: 8.1 10*3/uL (ref 4.0–10.5)
WBC: 7.3 10*3/uL (ref 4.0–10.5)
nRBC: 0 % (ref 0.0–0.2)
nRBC: 0 % (ref 0.0–0.2)

## 2019-03-26 LAB — PROTIME-INR
INR: 1.3 — ABNORMAL HIGH (ref 0.8–1.2)
Prothrombin Time: 16.4 seconds — ABNORMAL HIGH (ref 11.4–15.2)

## 2019-03-26 LAB — TYPE AND SCREEN
ABO/RH(D): A POS
Antibody Screen: NEGATIVE
Unit division: 0
Unit division: 0
Unit division: 0

## 2019-03-26 LAB — BPAM RBC
Blood Product Expiration Date: 202103192359
Blood Product Expiration Date: 202103222359
Blood Product Expiration Date: 202104122359
ISSUE DATE / TIME: 202103151646
ISSUE DATE / TIME: 202103160846
ISSUE DATE / TIME: 202103170428
Unit Type and Rh: 600
Unit Type and Rh: 6200
Unit Type and Rh: 6200

## 2019-03-26 LAB — COMPREHENSIVE METABOLIC PANEL
ALT: 23 U/L (ref 0–44)
AST: 38 U/L (ref 15–41)
Albumin: 1.8 g/dL — ABNORMAL LOW (ref 3.5–5.0)
Alkaline Phosphatase: 59 U/L (ref 38–126)
Anion gap: 7 (ref 5–15)
BUN: 18 mg/dL (ref 8–23)
CO2: 20 mmol/L — ABNORMAL LOW (ref 22–32)
Calcium: 8.3 mg/dL — ABNORMAL LOW (ref 8.9–10.3)
Chloride: 112 mmol/L — ABNORMAL HIGH (ref 98–111)
Creatinine, Ser: 1.13 mg/dL (ref 0.61–1.24)
GFR calc Af Amer: >60 mL/min (ref >60)
GFR calc non Af Amer: >60 mL/min (ref >60)
Glucose, Bld: 104 mg/dL — ABNORMAL HIGH (ref 70–99)
Potassium: 3.7 mmol/L (ref 3.5–5.1)
Sodium: 139 mmol/L (ref 135–145)
Total Bilirubin: 0.7 mg/dL (ref 0.3–1.2)
Total Protein: 4.4 g/dL — ABNORMAL LOW (ref 6.5–8.1)

## 2019-03-26 MED ORDER — ADULT MULTIVITAMIN W/MINERALS CH
1.0000 | ORAL_TABLET | Freq: Every day | ORAL | Status: DC
  Administered 2019-03-27 – 2019-03-28 (×2): 1 via ORAL
  Filled 2019-03-26 (×2): qty 1

## 2019-03-26 MED ORDER — ENSURE ENLIVE PO LIQD
237.0000 mL | Freq: Three times a day (TID) | ORAL | Status: DC
  Administered 2019-03-26 – 2019-03-28 (×5): 237 mL via ORAL

## 2019-03-26 MED ORDER — PHYTONADIONE 5 MG PO TABS
5.0000 mg | ORAL_TABLET | Freq: Every day | ORAL | Status: AC
  Administered 2019-03-26 – 2019-03-27 (×2): 5 mg via ORAL
  Filled 2019-03-26 (×2): qty 1

## 2019-03-26 MED ORDER — ENSURE ENLIVE PO LIQD: 237.0000 mL | Freq: Two times a day (BID) | ORAL | Status: DC

## 2019-03-26 NOTE — Progress Notes (Addendum)
Patient ID: KAMRIN STANBERY, male   DOB: 06/27/1955, 64 y.o.   MRN: [id number]   Subjective:  HD: 6  Patient reports feeling better today. He had no bowel movements yesterday. He denies abdominal pain, dizziness, and shortness of breath. He was able to get up yesterday a few times and sit in the chair. He reports that he feels less bloated than he has felt the last few days. He reports having to urinate about every 45 minutes since starting "fluid" pills.    Objective:  Vital signs in last 24 hours: Vitals:   03/25/19 0658 03/25/19 1637 03/25/19 2100 03/26/19 0452  BP: 105/62 116/68 98/60 110/62  Pulse: 74 79 73 72  Resp: 18 15 17 17   Temp: 98.2 F (36.8 C) 98.3 F (36.8 C) 98.2 F (36.8 C) 97.9 F (36.6 C)  TempSrc: Oral Oral Oral Oral  SpO2: 98% 100% 97% 97%  Weight:      Height:       Weight change:   Intake/Output Summary (Last 24 hours) at 03/26/2019 1014 Last data filed at 03/25/2019 1600 Gross per 24 hour  Intake 360 ml  Output --  Net 360 ml   Physical Exam Cardiovascular:     Rate and Rhythm: Normal rate and regular rhythm.     Pulses: Normal pulses.     Heart sounds: Normal heart sounds.  Pulmonary:     Effort: Pulmonary effort is normal.     Breath sounds: Normal breath sounds.  Abdominal:     General: There is distension.     Palpations: Abdomen is soft.     Tenderness: There is no abdominal tenderness.     Hernia: A hernia is present.     Comments: Ascites visualized with bedside butterfly ultrasound  Skin:    General: Skin is warm and dry.  Neurological:     Mental Status: He is alert and oriented to person, place, and time.    CBC Latest Ref Rng & Units 03/26/2019 03/25/2019 03/25/2019  WBC 4.0 - 10.5 K/uL 8.1 7.5 8.5  Hemoglobin 13.0 - 17.0 g/dL 7.3(L) 7.9(L) 6.9(LL)  Hematocrit 39.0 - 52.0 % 22.4(L) 24.5(L) 21.2(L)  Platelets 150 - 400 K/uL 199 194 189   INR: 03/18  1.3 03/17  1.4 0316  1.4  CMP Latest Ref Rng & Units 03/26/2019 03/25/2019  03/24/2019  Glucose 70 - 99 mg/dL 104(H) 97 104(H)  BUN 8 - 23 mg/dL 18 13 13   Creatinine 0.61 - 1.24 mg/dL 1.13 0.97 0.92  Sodium 135 - 145 mmol/L 139 139 139  Potassium 3.5 - 5.1 mmol/L 3.7 3.9 4.1  Chloride 98 - 111 mmol/L 112(H) 113(H) 112(H)  CO2 22 - 32 mmol/L 20(L) 18(L) 20(L)  Calcium 8.9 - 10.3 mg/dL 8.3(L) 8.1(L) 8.0(L)  Total Protein 6.5 - 8.1 g/dL 4.4(L) 4.0(L) 4.0(L)  Total Bilirubin 0.3 - 1.2 mg/dL 0.7 1.1 1.2  Alkaline Phos 38 - 126 U/L 59 54 50  AST 15 - 41 U/L 38 43(H) 42(H)  ALT 0 - 44 U/L 23 25 24     Assessment/Plan:  Principal Problem:   Acute GI bleeding Active Problems:   Acute blood loss anemia   Hepatic cirrhosis (HCC)  Summary:  Torry Grady is a 64 year old male with cirrhosis due to alcohol use disorder complicated by grade 1 esophageal varices and portal hypertensive gastropathy currently on hospital day 6 with a persistent slow GI bleed.     GI bleed with acute blood loss anemia:  The patient has had an ongoing GI bleed with minimal improvement since admission. Initial hematochezia likely due to diverticular bleeding, which had resolved. He then re-presented with hematochezia likely due to post-polypectomy bleeding treated with clip. He has since developed melena, however reports no bowel movements since 3/16 and Hgb has stayed above 7 since transfusion yesterday morning. Given reduction in bowel movements and more stable hemoglobin level it is possible that bleeding is decreased. However, given unknown origin of the bleed will continue to monitor closely and follow up with CBC this afternoon. [facility] was contacted yesterday and is unable to accept transfer at this time.   - CBC this afternoon - continue protonix 40mg  IV daily   Decompensated cirrhosis w/ ascites: Bedside ultrasound today showed decreased ascites, and patient's abdomen was noticeably less distended from previous day. The patient reports increased urination since starting Spironolactone and  Lasix yesterday. His blood pressures have remained stable and he is tolerating these new medications well. No evidence of encephalopathy. Considered paracentesis, but will not proceed at this time given small volume of ascites.  - continue spironolactone 100mg  and Lasix 40mg  daily - continue protein supplement for diet - continue oral vitamin k 5mg  for 2 days - not requiring lactulose  Ventral hernia:  Patient has a large ventral hernia containing the majority of the transverse colon. The hernia is not currently incarcerated or strangulated; there are no signs of obstruction. General surgery was consulted and not recommending intervention at this time due to potential for complications given decompensated cirrhosis.  Alcohol use disorder:  The patient has a history of significant alcohol use, approximately 1 pint a day. He has not had alcohol since his admission on 03/06 and has showed no signs or symptoms of withdrawal.  - continue supplementing folate 1mg  daily and thiamine 100mg  daily   Diet: High protein diet IVF: None VTE: SCDs, no lovenox due to ongoing slow GI bleeding. Encourage ambulation.  Code: Full PT/OT recs: None TOC recs: none     LOS: 6 days   Mikael Spray, Medical Student 03/26/2019, 10:14 AM

## 2019-03-26 NOTE — Plan of Care (Signed)
  Problem: Education: Goal: Knowledge of General Education information will improve Description Including pain rating scale, medication(s)/side effects and non-pharmacologic comfort measures Outcome: Progressing   

## 2019-03-26 NOTE — Progress Notes (Signed)
Initial Nutrition Assessment  DOCUMENTATION CODES:   Non-severe (moderate) malnutrition in context of chronic illness  INTERVENTION:   -Ensure Enlive po TID, each supplement provides 350 kcal and 20 grams of protein -MVI with minerals daily  NUTRITION DIAGNOSIS:   Moderate Malnutrition related to chronic illness(cirrhosis) as evidenced by energy intake < or equal to 75% for > or equal to 1 month, mild fat depletion, mild muscle depletion.  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  Pt admitted with rectal bleeding.  3/12- s/p colonoscopy- revealed polyp mod transverse colon in colon, large ventral hernia, and ulcerated mucous in distal transverse colon, polyp in sigmoid colon  Spoke with pt at bedside, who was pleasant and in good spirits today. He reports feeling great and was joking with this RD during visit. He reports his appetite has steadily improved since hospitalization, "especially now that th fluid has gone down". Noted meal completion 50-100%. Pt just ordered lunch (pizza), but had not eaten it yet.  PTA, pt was very forthcoming about his lack of oral intake secondary to ETOH abuse. Pt shared that he rarely ate solid food and most his intake came from alcohol (he average a pint of liquor daily). Pt suspect he has lost weight secondary to his alcoholism, but unsure how much. He shares his UBW is around 165#.   Discussed with pt importance of good meal and supplement intake to promote healing. Discussed importance of small, frequent meals, due to early  satiety. Pt amenable to Ensure supplements.   Medications reviewed and include vitamin B-12, thiamine, and folic acid.   Labs reviewed.  NUTRITION - FOCUSED PHYSICAL EXAM:    Most Recent Value  Orbital Region  Mild depletion  Upper Arm Region  Mild depletion  Thoracic and Lumbar Region  No depletion  Buccal Region  Mild depletion  Temple Region  Mild depletion  Clavicle Bone Region  Mild depletion  Clavicle and Acromion Bone Region  Mild depletion  Scapular Bone Region  Mild depletion  Dorsal Hand  No depletion  Patellar Region  Mild depletion  Anterior Thigh Region  Mild depletion  Posterior Calf Region  Mild depletion  Edema (RD Assessment)  None  Hair  Reviewed  Eyes  Reviewed  Mouth  Reviewed  Skin  Reviewed  Nails  Reviewed       Diet Order:   Diet Order            Diet regular Room service appropriate? Yes with Assist; Fluid consistency: Thin  Diet effective now              EDUCATION NEEDS:   Education needs have been addressed  Skin:  Skin Assessment: Reviewed RN Assessment  Last BM:  03/24/19  Height:   Ht Readings from Last 1 Encounters:  03/20/19 5\' 10"  (1.778 m)    Weight:   Wt Readings from Last 1 Encounters:  03/20/19 71.8 kg    Ideal Body Weight:  75.5 kg  BMI:  Body mass index is 22.71 kg/m.  Estimated Nutritional Needs:   Kcal:  [id number]  Protein:  125-145 grams  Fluid:  > 2.3 L    Loistine Chance, RD, LDN, [place] Registered Dietitian II Certified Diabetes Care and Education Specialist Please refer to [facility] for RD and/or RD on-call/weekend/after hours pager

## 2019-03-27 DIAGNOSIS — R188 Other ascites: Secondary | ICD-10-CM

## 2019-03-27 DIAGNOSIS — K7460 Unspecified cirrhosis of liver: Secondary | ICD-10-CM

## 2019-03-27 LAB — CBC
HCT: 21.5 % — ABNORMAL LOW (ref 39.0–52.0)
HCT: 25.6 % — ABNORMAL LOW (ref 39.0–52.0)
Hemoglobin: 6.8 g/dL — CL (ref 13.0–17.0)
Hemoglobin: 8.3 g/dL — ABNORMAL LOW (ref 13.0–17.0)
MCH: 30.5 pg (ref 26.0–34.0)
MCH: 30.2 pg (ref 26.0–34.0)
MCHC: 31.6 g/dL (ref 30.0–36.0)
MCHC: 32.4 g/dL (ref 30.0–36.0)
MCV: 96.4 fL (ref 80.0–100.0)
MCV: 93.1 fL (ref 80.0–100.0)
Platelets: 218 10*3/uL (ref 150–400)
Platelets: 214 10*3/uL (ref 150–400)
RBC: 2.23 MIL/uL — ABNORMAL LOW (ref 4.22–5.81)
RBC: 2.75 MIL/uL — ABNORMAL LOW (ref 4.22–5.81)
RDW: 17.6 % — ABNORMAL HIGH (ref 11.5–15.5)
RDW: 18.6 % — ABNORMAL HIGH (ref 11.5–15.5)
WBC: 7.4 10*3/uL (ref 4.0–10.5)
WBC: 6.3 10*3/uL (ref 4.0–10.5)
nRBC: 0 % (ref 0.0–0.2)
nRBC: 0 % (ref 0.0–0.2)

## 2019-03-27 LAB — COMPREHENSIVE METABOLIC PANEL
ALT: 24 U/L (ref 0–44)
AST: 40 U/L (ref 15–41)
Albumin: 1.9 g/dL — ABNORMAL LOW (ref 3.5–5.0)
Alkaline Phosphatase: 69 U/L (ref 38–126)
Anion gap: 9 (ref 5–15)
BUN: 16 mg/dL (ref 8–23)
CO2: 20 mmol/L — ABNORMAL LOW (ref 22–32)
Calcium: 8.5 mg/dL — ABNORMAL LOW (ref 8.9–10.3)
Chloride: 110 mmol/L (ref 98–111)
Creatinine, Ser: 1.08 mg/dL (ref 0.61–1.24)
GFR calc Af Amer: >60 mL/min (ref >60)
GFR calc non Af Amer: >60 mL/min (ref >60)
Glucose, Bld: 124 mg/dL — ABNORMAL HIGH (ref 70–99)
Potassium: 3.8 mmol/L (ref 3.5–5.1)
Sodium: 139 mmol/L (ref 135–145)
Total Bilirubin: 0.3 mg/dL (ref 0.3–1.2)
Total Protein: 4.6 g/dL — ABNORMAL LOW (ref 6.5–8.1)

## 2019-03-27 LAB — PROTIME-INR
INR: 1.2 (ref 0.8–1.2)
Prothrombin Time: 15.1 seconds (ref 11.4–15.2)

## 2019-03-27 LAB — PREPARE RBC (CROSSMATCH)

## 2019-03-27 MED ORDER — SODIUM CHLORIDE 0.9% IV SOLUTION: Freq: Once | INTRAVENOUS | Status: AC

## 2019-03-27 NOTE — Progress Notes (Signed)
Notified on call MD of critical Hgb 6.8, new order received.

## 2019-03-27 NOTE — Plan of Care (Signed)
  Problem: Education: Goal: Knowledge of General Education information will improve Description Including pain rating scale, medication(s)/side effects and non-pharmacologic comfort measures Outcome: Progressing   

## 2019-03-27 NOTE — Progress Notes (Signed)
   S: Patient reports feeling well today.  No bowel movements in the last 2 days.  Eating and drinking well.  No abdominal pain.  O:   Vitals:   03/27/19 0630 03/27/19 1211  BP: (!) 100/59 (!) 100/56  Pulse: 82 73  Resp: 18 18  Temp: 98 F (36.7 C) 98.2 F (36.8 C)  SpO2: 100% 98%   Gen: Well-appearing, lying in bed, no distress Abd: Large ventral hernia, abdomen soft, nontender, small volume ascites Ext: Warm, well-perfused, sarcopenia Neuro: Alert, oriented, conversational, normal strength throughout  A/P:   Principal Problem:   Acute GI bleeding Active Problems:   Acute blood loss anemia   Hepatic cirrhosis ([place])  Hospital day 8 for this 64 year old person living with cirrhosis admitted for a slow GI bleeding.  GI Bleeding: Has received 9 units of red cell transfusions this admission.  Seems like his bleeding is slowing down.  Has only required 1 transfusion over the last 48 hours.  No bowel movements in 48 hours.  Coagulopathy is corrected with oral vitamin K, INR 1.2 today.  Hemoglobin this afternoon 8.3 g.  Would trend hemoglobin, if his hemoglobin is stable and stool returns to normal could discharge over the weekend and follow-up in [facility] a few days later for CBC recheck.  If he has more bleeding, our GI service team offered no further endoscopy due to his complicating ventral hernia, and we will need to work on transfer to tertiary center again.  Cirrhosis: Looking more compensated.  Ascites is improving.  No encephalopathy.  No coagulopathy.  Continue with Lasix 40 mg daily and spironolactone 100 mg daily.  High-protein diet.  No Lovenox for DVT prophylaxis given GI bleeding, SCDs only.   Axel Filler, MD 03/27/2019, 2:54 PM

## 2019-03-28 ENCOUNTER — Other Ambulatory Visit: Payer: Self-pay | Admitting: Internal Medicine

## 2019-03-28 DIAGNOSIS — K922 Gastrointestinal hemorrhage, unspecified: Secondary | ICD-10-CM

## 2019-03-28 DIAGNOSIS — E440 Moderate protein-calorie malnutrition: Secondary | ICD-10-CM | POA: Insufficient documentation

## 2019-03-28 LAB — COMPREHENSIVE METABOLIC PANEL
ALT: 23 U/L (ref 0–44)
AST: 33 U/L (ref 15–41)
Albumin: 1.8 g/dL — ABNORMAL LOW (ref 3.5–5.0)
Alkaline Phosphatase: 62 U/L (ref 38–126)
Anion gap: 8 (ref 5–15)
BUN: 20 mg/dL (ref 8–23)
CO2: 21 mmol/L — ABNORMAL LOW (ref 22–32)
Calcium: 8.5 mg/dL — ABNORMAL LOW (ref 8.9–10.3)
Chloride: 109 mmol/L (ref 98–111)
Creatinine, Ser: 0.95 mg/dL (ref 0.61–1.24)
GFR calc Af Amer: >60 mL/min (ref >60)
GFR calc non Af Amer: >60 mL/min (ref >60)
Glucose, Bld: 122 mg/dL — ABNORMAL HIGH (ref 70–99)
Potassium: 4 mmol/L (ref 3.5–5.1)
Sodium: 138 mmol/L (ref 135–145)
Total Bilirubin: 0.8 mg/dL (ref 0.3–1.2)
Total Protein: 4.6 g/dL — ABNORMAL LOW (ref 6.5–8.1)

## 2019-03-28 LAB — TYPE AND SCREEN
ABO/RH(D): A POS
Antibody Screen: NEGATIVE
Unit division: 0

## 2019-03-28 LAB — PROTIME-INR
INR: 1.3 — ABNORMAL HIGH (ref 0.8–1.2)
Prothrombin Time: 15.7 seconds — ABNORMAL HIGH (ref 11.4–15.2)

## 2019-03-28 LAB — CBC
HCT: 21.8 % — ABNORMAL LOW (ref 39.0–52.0)
HCT: 21.7 % — ABNORMAL LOW (ref 39.0–52.0)
Hemoglobin: 7 g/dL — ABNORMAL LOW (ref 13.0–17.0)
Hemoglobin: 7 g/dL — ABNORMAL LOW (ref 13.0–17.0)
MCH: 30 pg (ref 26.0–34.0)
MCH: 30.2 pg (ref 26.0–34.0)
MCHC: 32.1 g/dL (ref 30.0–36.0)
MCHC: 32.3 g/dL (ref 30.0–36.0)
MCV: 93.6 fL (ref 80.0–100.0)
MCV: 93.5 fL (ref 80.0–100.0)
Platelets: 214 10*3/uL (ref 150–400)
Platelets: 240 10*3/uL (ref 150–400)
RBC: 2.33 MIL/uL — ABNORMAL LOW (ref 4.22–5.81)
RBC: 2.32 MIL/uL — ABNORMAL LOW (ref 4.22–5.81)
RDW: 18.1 % — ABNORMAL HIGH (ref 11.5–15.5)
RDW: 17.9 % — ABNORMAL HIGH (ref 11.5–15.5)
WBC: 8.6 10*3/uL (ref 4.0–10.5)
WBC: 7.6 10*3/uL (ref 4.0–10.5)
nRBC: 0 % (ref 0.0–0.2)
nRBC: 0 % (ref 0.0–0.2)

## 2019-03-28 LAB — BPAM RBC
Blood Product Expiration Date: 202104112359
ISSUE DATE / TIME: 202103190342
Unit Type and Rh: 6200

## 2019-03-28 MED ORDER — FUROSEMIDE 40 MG PO TABS: 40.0000 mg | ORAL_TABLET | Freq: Every day | ORAL | 3 refills | Status: DC

## 2019-03-28 MED ORDER — PANTOPRAZOLE SODIUM 40 MG PO TBEC: 40.0000 mg | DELAYED_RELEASE_TABLET | Freq: Every day | ORAL | 1 refills | Status: DC

## 2019-03-28 MED ORDER — SPIRONOLACTONE 100 MG PO TABS: 100.0000 mg | ORAL_TABLET | Freq: Every day | ORAL | 3 refills | Status: DC

## 2019-03-28 MED ORDER — SODIUM CHLORIDE 0.9% IV SOLUTION: Freq: Once | INTRAVENOUS | Status: DC

## 2019-03-28 NOTE — Discharge Summary (Addendum)
Name: Gavin Arroyo MRN: [id number] DOB: Oct 24, 1955 64 y.o. PCP: Patient, No Pcp Per  Date of Admission: 03/19/2019  1:26 PM Date of Discharge: 03/28/2019 Attending Physician: Axel Filler, *  Discharge Diagnosis: 1.  GI bleed 2.  Compensated liver cirrhosis 3.  Ventral hernia  Discharge Medications: Allergies as of 03/28/2019   No Known Allergies     Medication List    STOP taking these medications   HYDROcodone-acetaminophen 5-325 MG tablet Commonly known as: NORCO/VICODIN     TAKE these medications   furosemide 40 MG tablet Commonly known as: LASIX Take 1 tablet (40 mg total) by mouth daily. Start taking on: March 29, 2019   pantoprazole 40 MG tablet Commonly known as: Protonix Take 1 tablet (40 mg total) by mouth daily.   spironolactone 100 MG tablet Commonly known as: ALDACTONE Take 1 tablet (100 mg total) by mouth daily. Start taking on: March 29, 2019       Disposition and follow-up:   Gavin Arroyo was discharged from [facility] in [place] condition.  At the hospital follow up visit please address:  1.  GI bleed: Please obtain CBC and evaluate for melena.   -S/p 9U PRBC transfusion + oral Vitamin K -Colonoscopy x2 reviewed diverticulosis, polyps that were resected.  Second EGD was unable to be successfully performed due to large ventral hernia containing majority of the transverse colon -He has been instructed to follow-up with [facility], [facility] or [facility]       Compensated liver cirrhosis: Please ensure he is taking Lasix/spinal lactone combination         Ventral hernia: He was evaluated by general surgery who deemed him high risk given his underlying cirrhosis  2.  Labs / imaging needed at time of follow-up: CBC  3.  Pending labs/ test needing follow-up: None  Follow-up Appointments: [place].   Specialty: Family Medicine Contact information: [place] [address] [address] [facility] Course by problem list: 1.  GI bleed: Gavin Arroyo is a 64 year old gentleman with medical history significant for alcohol use disorder, compensated liver cirrhosis, nonbleeding grade 1 esophageal varices, portal hypertensive gastropathy and ventral hernia who initially presented to the hospital with GI bleeding.  He was recently admitted from 03/14/2019 to 03/18/2019 with GI bleeding.  During that visit, an EGD showed grade 1 esophageal varices, portal hypertensive gastropathy and erythematous mucosa in the antrum. Colonoscopy revealed three 10 to 15 mm polyps in the transverse colon that were removed, two 9-20 mm polyps in the ascending colon that were removed, One 25 mm polyp in the sigmoid colon removed.  It also showed diverticulosis in the sigmoid colon and descending colon.   He was readmitted on 03/19/2019 with melena and was noted to have a hemoglobin of 5.  During this admission, he received a total of 9 units PRBC transfusion over the course of 9 days and oral vitamin K repletion.  His melanic stools significantly improved and on the day of discharge his hemoglobin stabilized at 7 g/dL x2.  Our gastroenterology colleagues had recommended him to be transferred to a tertiary center such as [facility], [facility] or [facility] for advanced endoscopy and hepatology care however we were unsuccessful in our attempts.  It was reassuring that his hemoglobin stabilized.  He was given strict return precautions.  We will have  him follow-up with our clinic on Monday for CBC and to follow-up on his symptoms.    2.  Compensated liver cirrhosis, EtOH induced liver cirrhosis: Bedside ultrasound performed during this hospitalization revealed small to moderate amount of ascites.  He was started on Lasix/spironolactone and he did well.    3.  Ventral hernia: He has a longstanding history of a large ventral hernia that contains the  majority of his transverse colon.  He did not have any evidence of incarceration or strangulation.  We had spoken to our general surgery colleagues and they recommended no surgical intervention at this time due to his underlying cirrhosis.   Discharge Vitals:   BP (!) 94/47 (BP Location: Left Arm) Comment: rn notified  Pulse 81   Temp 98.2 F (36.8 C) (Oral)   Resp 14   Ht 5\' 10"  (1.778 m)   Wt 71.8 kg   SpO2 100%   BMI 22.71 kg/m   Pertinent Labs, Studies, and Procedures:  CBC Latest Ref Rng & Units 03/28/2019 03/28/2019 03/27/2019  WBC 4.0 - 10.5 K/uL 7.6 8.6 6.3  Hemoglobin 13.0 - 17.0 g/dL 7.0(L) 7.0(L) 8.3(L)  Hematocrit 39.0 - 52.0 % 21.7(L) 21.8(L) 25.6(L)  Platelets 150 - 400 K/uL 240 214 214    Discharge Instructions: Discharge Instructions    Call MD for:  extreme fatigue   Complete by: As directed    Call MD for:  persistant dizziness or light-headedness   Complete by: As directed    Diet - low sodium heart healthy   Complete by: As directed    Diet - low sodium heart healthy   Complete by: As directed    Discharge instructions   Complete by: As directed    You were hospitalized for GI bleed. Thank you for allowing [place] to be part of your care.   Please arrange follow up with a care physician.  The social workers will help you arrange this appointment.  Please note these changes made to your medications: See discharge instruction   Please make sure to follow up with a primary care doctor.  Please call our clinic if you have any questions or concerns, we may be able to help and keep you from a long and expensive emergency room wait. Our clinic and after hours phone number is [phone], the best time to call is Monday through Friday 9 am to 4 pm but there is always someone available 24/7 if you have an emergency. If you need medication refills please notify your pharmacy one week in advance and they will send [place] a request.   Discharge instructions   Complete by: As  directed    Gavin Arroyo,  It was a pleasure taking care of you here in the hospital.  You were admitted because of gastrointestinal bleeding and you received blood transfusion.  As we had been discussing, we would like for you to be evaluated at a tertiary center such as [facility], [facility] or [place].  Your blood count has stabilized and since you are feeling well, it is safe to discharge you.  Regarding your cirrhosis, we did find small amount of fluid in your belly and we started you on 2 medications "Lasix and spironolactone.  Please pick this up at the [facility].  I would like for you to see [place] in our clinic on Monday morning for blood work and also to see a physician.  Our clinic address is: [address]. which is located at  the ground-level of the hospital.  The clinic is call the internal medicine Center.  Our number is [phone]  Take care!   Increase activity slowly   Complete by: As directed    Increase activity slowly   Complete by: As directed       Signed: Jean Rosenthal, MD 03/28/2019, 6:11 PM   Pager: [phone] Internal Medicine Teaching Service

## 2019-03-28 NOTE — Progress Notes (Signed)
Patient discharged to home with instructions. 

## 2019-03-28 NOTE — Progress Notes (Signed)
   Subjective: HD#8   Overnight: No acute events reported  Today, Gavin Arroyo states that he had 3 bowel movements yesterday and they were "dark" though not like it was prior to his BMs before admission. He denies chest pain, shortness of breath, abdominal pain.  Objective:  Vital signs in last 24 hours: Vitals:   03/27/19 0630 03/27/19 1211 03/27/19 2129 03/28/19 0455  BP: (!) 100/59 (!) 100/56 (!) 105/55 (!) 107/51  Pulse: 82 73 80 87  Resp: 18 18 17 17   Temp: 98 F (36.7 C) 98.2 F (36.8 C) 98.5 F (36.9 C) 97.8 F (36.6 C)  TempSrc: Oral Oral Oral Oral  SpO2: 100% 98% 100% 99%  Weight:      Height:       Const: In no apparent distress, lying comfortably in bed, conversational CV: RRR, no murmurs, gallop, rub Abd: Bowel sounds present, ventral hernia appreciated   Assessment/Plan:  Principal Problem:   Acute GI bleeding Active Problems:   Acute blood loss anemia   Hepatic cirrhosis (HCC)  Gavin Arroyo is a 64 year old male with cirrhosis due to alcohol use disorder complicated by grade 1 esophageal varices and portal hypertensive gastropathy here for evaluation of GI bleed.   GI bleed  Hemoglobin this a.m. 7<<8.3<<6.8<<7<<7.3.  MCV continues to remain normocytic.  There is no evidence of thrombocytopenia.  So far, he has received 9 units transfusion of PRBC.  Our gastroenterology colleagues were recommending a transfer to a tertiary center however we have been unsuccessful finding an accepting center will be able to provide advanced endoscopy and hepatology management for outpatient. -Continue to monitor -Follow-up CBC in the afternoon -Continue IV PPI   Compensated liver cirrhosis w/ ascites: -Continue spironolactone 100mg  and Lasix 40mg  daily -Continue protein supplement for diet   Ventral hernia:  Patient has a large ventral hernia containing the majority of the transverse colon. The hernia is not currently incarcerated or strangulated; there are no signs  of obstruction. General surgery was consulted and not recommending intervention.  His abdominal pain has significantly improved and he denies any bloating sensation since we started spironolactone/Lasix combination.  In the future, our general surgery colleagues are recommending for him to be evaluated at a tertiary center given his underlying comorbidities.   Alcohol usedisorder:  -Continue supplementing folate 1mg  daily and thiamine 100mg  daily   Diet:High protein diet [id number]. Code:Full  Prior to Admission Living Arrangement: Home  Anticipated Discharge Location: Home Barriers to Discharge: Ongoing medical management  Dispo: Anticipated discharge in approximately 1-2 day(s).    Jean Rosenthal, MD 03/28/2019, 6:50 AM Pager: [phone] Internal Medicine Teaching Service

## 2019-03-30 ENCOUNTER — Other Ambulatory Visit: Payer: Self-pay

## 2019-03-30 ENCOUNTER — Inpatient Hospital Stay (HOSPITAL_COMMUNITY): Payer: Self-pay

## 2019-03-30 ENCOUNTER — Inpatient Hospital Stay (HOSPITAL_COMMUNITY)
Admission: EM | Admit: 2019-03-30 | Discharge: 2019-04-18 | DRG: 378 | Disposition: A | Discharge status: Other Acute Inpatient Hospital | Payer: Self-pay | Source: Other Acute Inpatient Hospital | Attending: Internal Medicine | Admitting: Internal Medicine

## 2019-03-30 DIAGNOSIS — I959 Hypotension, unspecified: Secondary | ICD-10-CM | POA: Diagnosis present

## 2019-03-30 DIAGNOSIS — F1010 Alcohol abuse, uncomplicated: Secondary | ICD-10-CM | POA: Diagnosis present

## 2019-03-30 DIAGNOSIS — Z9889 Other specified postprocedural states: Secondary | ICD-10-CM

## 2019-03-30 DIAGNOSIS — E872 Acidosis: Secondary | ICD-10-CM | POA: Diagnosis present

## 2019-03-30 DIAGNOSIS — K922 Gastrointestinal hemorrhage, unspecified: Secondary | ICD-10-CM | POA: Diagnosis present

## 2019-03-30 DIAGNOSIS — K7290 Hepatic failure, unspecified without coma: Secondary | ICD-10-CM

## 2019-03-30 DIAGNOSIS — D649 Anemia, unspecified: Secondary | ICD-10-CM | POA: Diagnosis present

## 2019-03-30 DIAGNOSIS — K921 Melena: Principal | ICD-10-CM | POA: Diagnosis present

## 2019-03-30 DIAGNOSIS — K7469 Other cirrhosis of liver: Secondary | ICD-10-CM

## 2019-03-30 DIAGNOSIS — K7031 Alcoholic cirrhosis of liver with ascites: Secondary | ICD-10-CM | POA: Diagnosis present

## 2019-03-30 DIAGNOSIS — K7040 Alcoholic hepatic failure without coma: Secondary | ICD-10-CM | POA: Diagnosis present

## 2019-03-30 DIAGNOSIS — G939 Disorder of brain, unspecified: Secondary | ICD-10-CM

## 2019-03-30 DIAGNOSIS — K7460 Unspecified cirrhosis of liver: Secondary | ICD-10-CM | POA: Diagnosis present

## 2019-03-30 DIAGNOSIS — R64 Cachexia: Secondary | ICD-10-CM | POA: Diagnosis present

## 2019-03-30 DIAGNOSIS — R791 Abnormal coagulation profile: Secondary | ICD-10-CM | POA: Diagnosis present

## 2019-03-30 DIAGNOSIS — K439 Ventral hernia without obstruction or gangrene: Secondary | ICD-10-CM | POA: Diagnosis present

## 2019-03-30 DIAGNOSIS — F17210 Nicotine dependence, cigarettes, uncomplicated: Secondary | ICD-10-CM | POA: Diagnosis present

## 2019-03-30 DIAGNOSIS — K766 Portal hypertension: Secondary | ICD-10-CM | POA: Diagnosis present

## 2019-03-30 DIAGNOSIS — Z781 Physical restraint status: Secondary | ICD-10-CM

## 2019-03-30 DIAGNOSIS — Z681 Body mass index (BMI) 19 or less, adult: Secondary | ICD-10-CM

## 2019-03-30 DIAGNOSIS — Z20822 Contact with and (suspected) exposure to covid-19: Secondary | ICD-10-CM | POA: Diagnosis present

## 2019-03-30 DIAGNOSIS — K3189 Other diseases of stomach and duodenum: Secondary | ICD-10-CM | POA: Diagnosis present

## 2019-03-30 DIAGNOSIS — K7682 Hepatic encephalopathy: Secondary | ICD-10-CM | POA: Diagnosis present

## 2019-03-30 DIAGNOSIS — I8510 Secondary esophageal varices without bleeding: Secondary | ICD-10-CM | POA: Diagnosis present

## 2019-03-30 DIAGNOSIS — D72829 Elevated white blood cell count, unspecified: Secondary | ICD-10-CM | POA: Diagnosis present

## 2019-03-30 DIAGNOSIS — Z9114 Patient's other noncompliance with medication regimen: Secondary | ICD-10-CM

## 2019-03-30 DIAGNOSIS — D62 Acute posthemorrhagic anemia: Secondary | ICD-10-CM | POA: Diagnosis present

## 2019-03-30 DIAGNOSIS — E8809 Other disorders of plasma-protein metabolism, not elsewhere classified: Secondary | ICD-10-CM | POA: Diagnosis present

## 2019-03-30 DIAGNOSIS — K029 Dental caries, unspecified: Secondary | ICD-10-CM

## 2019-03-30 DIAGNOSIS — Z0189 Encounter for other specified special examinations: Secondary | ICD-10-CM

## 2019-03-30 LAB — RETICULOCYTES
Immature Retic Fract: 29.9 % — ABNORMAL HIGH (ref 2.3–15.9)
RBC.: 2.35 MIL/uL — ABNORMAL LOW (ref 4.22–5.81)
Retic Count, Absolute: 220.9 10*3/uL — ABNORMAL HIGH (ref 19.0–186.0)
Retic Ct Pct: 9.4 % — ABNORMAL HIGH (ref 0.4–3.1)

## 2019-03-30 LAB — CBC WITH DIFFERENTIAL/PLATELET
Abs Immature Granulocytes: 0.04 10*3/uL (ref 0.00–0.07)
Basophils Absolute: 0.1 10*3/uL (ref 0.0–0.1)
Basophils Relative: 0 %
Eosinophils Absolute: 0.1 10*3/uL (ref 0.0–0.5)
Eosinophils Relative: 0 %
HCT: 23.5 % — ABNORMAL LOW (ref 39.0–52.0)
Hemoglobin: 7.2 g/dL — ABNORMAL LOW (ref 13.0–17.0)
Immature Granulocytes: 0 %
Lymphocytes Relative: 14 %
Lymphs Abs: 1.5 10*3/uL (ref 0.7–4.0)
MCH: 30.1 pg (ref 26.0–34.0)
MCHC: 30.6 g/dL (ref 30.0–36.0)
MCV: 98.3 fL (ref 80.0–100.0)
Monocytes Absolute: 1 10*3/uL (ref 0.1–1.0)
Monocytes Relative: 9 %
Neutro Abs: 8.5 10*3/uL — ABNORMAL HIGH (ref 1.7–7.7)
Neutrophils Relative %: 77 %
Platelets: 291 10*3/uL (ref 150–400)
RBC: 2.39 MIL/uL — ABNORMAL LOW (ref 4.22–5.81)
RDW: 16.4 % — ABNORMAL HIGH (ref 11.5–15.5)
WBC: 11.2 10*3/uL — ABNORMAL HIGH (ref 4.0–10.5)
nRBC: 0 % (ref 0.0–0.2)

## 2019-03-30 LAB — POCT I-STAT 7, (LYTES, BLD GAS, ICA,H+H)
Acid-base deficit: 7 mmol/L — ABNORMAL HIGH (ref 0.0–2.0)
Bicarbonate: 14.8 mmol/L — ABNORMAL LOW (ref 20.0–28.0)
Calcium, Ion: 1.22 mmol/L (ref 1.15–1.40)
HCT: 22 % — ABNORMAL LOW (ref 39.0–52.0)
Hemoglobin: 7.5 g/dL — ABNORMAL LOW (ref 13.0–17.0)
O2 Saturation: 99 %
Patient temperature: 97.6
Potassium: 3.8 mmol/L (ref 3.5–5.1)
Sodium: 145 mmol/L (ref 135–145)
TCO2: 15 mmol/L — ABNORMAL LOW (ref 22–32)
pCO2 arterial: 18.7 mmHg — CL (ref 32.0–48.0)
pH, Arterial: 7.505 — ABNORMAL HIGH (ref 7.350–7.450)
pO2, Arterial: 112 mmHg — ABNORMAL HIGH (ref 83.0–108.0)

## 2019-03-30 LAB — IRON AND TIBC
Iron: 14 ug/dL — ABNORMAL LOW (ref 45–182)
Saturation Ratios: 4 % — ABNORMAL LOW (ref 17.9–39.5)
TIBC: 363 ug/dL (ref 250–450)
UIBC: 349 ug/dL

## 2019-03-30 LAB — COMPREHENSIVE METABOLIC PANEL
ALT: 25 U/L (ref 0–44)
ALT: 22 U/L (ref 0–44)
AST: 39 U/L (ref 15–41)
AST: 34 U/L (ref 15–41)
Albumin: 2.2 g/dL — ABNORMAL LOW (ref 3.5–5.0)
Albumin: 2 g/dL — ABNORMAL LOW (ref 3.5–5.0)
Alkaline Phosphatase: 55 U/L (ref 38–126)
Alkaline Phosphatase: 48 U/L (ref 38–126)
Anion gap: 14 (ref 5–15)
Anion gap: 7 (ref 5–15)
BUN: 33 mg/dL — ABNORMAL HIGH (ref 8–23)
BUN: 29 mg/dL — ABNORMAL HIGH (ref 8–23)
CO2: 13 mmol/L — ABNORMAL LOW (ref 22–32)
CO2: 16 mmol/L — ABNORMAL LOW (ref 22–32)
Calcium: 8.7 mg/dL — ABNORMAL LOW (ref 8.9–10.3)
Calcium: 8 mg/dL — ABNORMAL LOW (ref 8.9–10.3)
Chloride: 116 mmol/L — ABNORMAL HIGH (ref 98–111)
Chloride: 121 mmol/L — ABNORMAL HIGH (ref 98–111)
Creatinine, Ser: 1.12 mg/dL (ref 0.61–1.24)
Creatinine, Ser: 1.07 mg/dL (ref 0.61–1.24)
GFR calc Af Amer: >60 mL/min (ref >60)
GFR calc Af Amer: >60 mL/min (ref >60)
GFR calc non Af Amer: >60 mL/min (ref >60)
GFR calc non Af Amer: >60 mL/min (ref >60)
Glucose, Bld: 156 mg/dL — ABNORMAL HIGH (ref 70–99)
Glucose, Bld: 112 mg/dL — ABNORMAL HIGH (ref 70–99)
Potassium: 4 mmol/L (ref 3.5–5.1)
Potassium: 3.8 mmol/L (ref 3.5–5.1)
Sodium: 143 mmol/L (ref 135–145)
Sodium: 144 mmol/L (ref 135–145)
Total Bilirubin: 1.2 mg/dL (ref 0.3–1.2)
Total Bilirubin: 1.5 mg/dL — ABNORMAL HIGH (ref 0.3–1.2)
Total Protein: 5.1 g/dL — ABNORMAL LOW (ref 6.5–8.1)
Total Protein: 4.6 g/dL — ABNORMAL LOW (ref 6.5–8.1)

## 2019-03-30 LAB — FERRITIN: Ferritin: 53 ng/mL (ref 24–336)

## 2019-03-30 LAB — CBC
HCT: 26.4 % — ABNORMAL LOW (ref 39.0–52.0)
Hemoglobin: 8.6 g/dL — ABNORMAL LOW (ref 13.0–17.0)
MCH: 29.9 pg (ref 26.0–34.0)
MCHC: 32.6 g/dL (ref 30.0–36.0)
MCV: 91.7 fL (ref 80.0–100.0)
Platelets: 212 10*3/uL (ref 150–400)
RBC: 2.88 MIL/uL — ABNORMAL LOW (ref 4.22–5.81)
RDW: 16.2 % — ABNORMAL HIGH (ref 11.5–15.5)
WBC: 8.9 10*3/uL (ref 4.0–10.5)
nRBC: 0 % (ref 0.0–0.2)

## 2019-03-30 LAB — PREPARE RBC (CROSSMATCH)

## 2019-03-30 LAB — FOLATE: Folate: 17.6 ng/mL (ref >5.9)

## 2019-03-30 LAB — SARS CORONAVIRUS 2 (TAT 6-24 HRS): SARS Coronavirus 2: NEGATIVE

## 2019-03-30 LAB — VITAMIN B12: Vitamin B-12: 1055 pg/mL — ABNORMAL HIGH (ref 180–914)

## 2019-03-30 MED ORDER — FUROSEMIDE 10 MG/ML IJ SOLN: 20.0000 mg | Freq: Every day | INTRAMUSCULAR | Status: DC

## 2019-03-30 MED ORDER — THIAMINE HCL 100 MG PO TABS: 100.0000 mg | ORAL_TABLET | Freq: Every day | ORAL | Status: DC

## 2019-03-30 MED ORDER — LACTULOSE ENEMA
300.0000 mL | Freq: Once | ORAL | Status: DC
  Filled 2019-03-30: qty 300

## 2019-03-30 MED ORDER — FUROSEMIDE 20 MG PO TABS: 40.0000 mg | ORAL_TABLET | Freq: Every day | ORAL | Status: DC

## 2019-03-30 MED ORDER — PROMETHAZINE HCL 25 MG PO TABS: 12.5000 mg | ORAL_TABLET | Freq: Four times a day (QID) | ORAL | Status: DC | PRN

## 2019-03-30 MED ORDER — FOLIC ACID 5 MG/ML IJ SOLN
1.0000 mg | Freq: Every day | INTRAMUSCULAR | Status: DC
  Administered 2019-03-30 – 2019-04-05 (×6): 1 mg via INTRAVENOUS
  Filled 2019-03-30 (×10): qty 0.2

## 2019-03-30 MED ORDER — LIDOCAINE VISCOUS HCL 2 % MT SOLN
6.0000 mL | Freq: Once | OROMUCOSAL | Status: AC
  Administered 2019-03-30: 6 mL via OROMUCOSAL

## 2019-03-30 MED ORDER — THIAMINE HCL 100 MG/ML IJ SOLN: 100.0000 mg | Freq: Every day | INTRAMUSCULAR | Status: DC

## 2019-03-30 MED ORDER — LORAZEPAM 1 MG PO TABS: 0.0000 mg | ORAL_TABLET | Freq: Four times a day (QID) | ORAL | Status: DC

## 2019-03-30 MED ORDER — SODIUM CHLORIDE 0.9 % IV SOLN
80.0000 mg | Freq: Once | INTRAVENOUS | Status: AC
  Administered 2019-03-30: 80 mg via INTRAVENOUS
  Filled 2019-03-30: qty 80

## 2019-03-30 MED ORDER — ADULT MULTIVITAMIN W/MINERALS CH
1.0000 | ORAL_TABLET | Freq: Every day | ORAL | Status: DC
  Administered 2019-04-01 – 2019-04-17 (×16): 1 via ORAL
  Filled 2019-03-30 (×16): qty 1

## 2019-03-30 MED ORDER — SENNOSIDES-DOCUSATE SODIUM 8.6-50 MG PO TABS: 1.0000 | ORAL_TABLET | Freq: Every evening | ORAL | Status: DC | PRN

## 2019-03-30 MED ORDER — SPIRONOLACTONE 100 MG PO TABS: 100.0000 mg | ORAL_TABLET | Freq: Every day | ORAL | Status: DC

## 2019-03-30 MED ORDER — LACTULOSE ENEMA
300.0000 mL | Freq: Once | ORAL | Status: AC
  Administered 2019-03-30: 300 mL via RECTAL
  Filled 2019-03-30: qty 300

## 2019-03-30 MED ORDER — LORAZEPAM 2 MG/ML IJ SOLN: 0.0000 mg | Freq: Four times a day (QID) | INTRAMUSCULAR | Status: DC

## 2019-03-30 MED ORDER — THIAMINE HCL 100 MG/ML IJ SOLN
100.0000 mg | Freq: Every day | INTRAMUSCULAR | Status: DC
  Administered 2019-03-30 – 2019-04-05 (×8): 100 mg via INTRAVENOUS
  Filled 2019-03-30 (×8): qty 2

## 2019-03-30 MED ORDER — LACTULOSE 10 GM/15ML PO SOLN
20.0000 g | Freq: Three times a day (TID) | ORAL | Status: DC
  Administered 2019-03-30 (×2): 20 g
  Filled 2019-03-30 (×3): qty 30

## 2019-03-30 MED ORDER — SODIUM CHLORIDE 0.9 % IV BOLUS
1000.0000 mL | Freq: Once | INTRAVENOUS | Status: AC
  Administered 2019-03-30: 1000 mL via INTRAVENOUS

## 2019-03-30 MED ORDER — LORAZEPAM 1 MG PO TABS: 0.0000 mg | ORAL_TABLET | Freq: Two times a day (BID) | ORAL | Status: DC

## 2019-03-30 MED ORDER — PANTOPRAZOLE SODIUM 40 MG IV SOLR
40.0000 mg | Freq: Two times a day (BID) | INTRAVENOUS | Status: DC
  Administered 2019-04-02 – 2019-04-10 (×17): 40 mg via INTRAVENOUS
  Filled 2019-03-30 (×17): qty 40

## 2019-03-30 MED ORDER — ACETAMINOPHEN 325 MG PO TABS
650.0000 mg | ORAL_TABLET | Freq: Four times a day (QID) | ORAL | Status: DC | PRN
  Administered 2019-04-14 – 2019-04-15 (×2): 650 mg via ORAL
  Filled 2019-03-30 (×2): qty 2

## 2019-03-30 MED ORDER — RIFAXIMIN 550 MG PO TABS: 550.0000 mg | ORAL_TABLET | Freq: Two times a day (BID) | ORAL | Status: DC

## 2019-03-30 MED ORDER — LORAZEPAM 2 MG/ML IJ SOLN: 0.0000 mg | Freq: Two times a day (BID) | INTRAMUSCULAR | Status: DC

## 2019-03-30 MED ORDER — LORAZEPAM 2 MG/ML IJ SOLN
1.0000 mg | Freq: Once | INTRAMUSCULAR | Status: AC
  Administered 2019-03-30: 1 mg via INTRAVENOUS
  Filled 2019-03-30: qty 1

## 2019-03-30 MED ORDER — SODIUM CHLORIDE 0.9 % IV SOLN
8.0000 mg/h | INTRAVENOUS | Status: AC
  Administered 2019-03-30 – 2019-04-01 (×7): 8 mg/h via INTRAVENOUS
  Filled 2019-03-30 (×9): qty 80

## 2019-03-30 MED ORDER — LACTULOSE ENEMA: 300.0000 mL | Freq: Two times a day (BID) | ORAL | Status: DC

## 2019-03-30 MED ORDER — SODIUM CHLORIDE 0.9 % IV SOLN: 10.0000 mL/h | Freq: Once | INTRAVENOUS | Status: AC

## 2019-03-30 MED ORDER — ACETAMINOPHEN 650 MG RE SUPP: 650.0000 mg | Freq: Four times a day (QID) | RECTAL | Status: DC | PRN

## 2019-03-30 NOTE — ED Notes (Signed)
Rectal kit to give lactulose reordered from [facility]

## 2019-03-30 NOTE — ED Provider Notes (Signed)
[place] EMERGENCY DEPARTMENT Provider Note   CSN: [id number] Arrival date & time: 03/30/19  0036     History Chief Complaint  Patient presents with  . GI Bleeding    Gavin Arroyo is a 64 y.o. male.  The history is provided by the patient and medical records.    64 year old male with history of GI bleeding, (just discharged from this facility on 03/28/2019 for same), cirrhosis secondary to alcohol abuse presenting to the ED as a transfer from [facility] for evaluation of GI bleeding.  Patient presented to their facility via private vehicle with patient's girlfriend.  States he has not been doing anything for the past 2 days, refusing to take any of his medications.  He was found to have dark, tarry stools there with a hemoglobin of 5.8 and elevated ammonia at 87.  He was given 1 unit of PRBCs and lactulose enema there along with a single dose of Ativan.  Patient was transferred here for admission and GI consultation.  During last admission, he was followed by [facility], Dr. Azucena Freed endoscopy and colonoscopy x2.  During last admission he received a total of 9 units of PRBCs.  Past Medical History:  Diagnosis Date  . GI bleeding 03/2019    Patient Active Problem List   Diagnosis Date Noted  . Malnutrition of moderate degree 03/28/2019  . Hepatic cirrhosis ([place]) 03/24/2019  . Acute GI bleeding 03/20/2019  . Acute blood loss anemia 03/19/2019  . Symptomatic anemia   . NAGMA 03/14/2019  . Alcohol use disorder, moderate, dependence ([place]) 03/14/2019    Past Surgical History:  Procedure Laterality Date  . COLONOSCOPY WITH PROPOFOL N/A 03/16/2019   Procedure: COLONOSCOPY WITH PROPOFOL;  Surgeon: Ronnette Juniper, MD;  Location: [place];  Service: Gastroenterology;  Laterality: N/A;  . COLONOSCOPY WITH PROPOFOL N/A 03/20/2019   Procedure: COLONOSCOPY WITH PROPOFOL;  Surgeon: Ronnette Juniper, MD;  Location: [place];  Service: Gastroenterology;   Laterality: N/A;  . ESOPHAGOGASTRODUODENOSCOPY (EGD) WITH PROPOFOL N/A 03/16/2019   Procedure: ESOPHAGOGASTRODUODENOSCOPY (EGD) WITH PROPOFOL;  Surgeon: Ronnette Juniper, MD;  Location: [place];  Service: Gastroenterology;  Laterality: N/A;  . HEMOSTASIS CLIP PLACEMENT  03/20/2019   Procedure: HEMOSTASIS CLIP PLACEMENT;  Surgeon: Ronnette Juniper, MD;  Location: [facility] ENDOSCOPY;  Service: Gastroenterology;;  . HEMOSTASIS CONTROL  03/20/2019   Procedure: HEMOSTASIS CONTROL;  Surgeon: Ronnette Juniper, MD;  Location: [facility] ENDOSCOPY;  Service: Gastroenterology;;  . POLYPECTOMY  03/16/2019   Procedure: POLYPECTOMY;  Surgeon: Ronnette Juniper, MD;  Location: [facility] ENDOSCOPY;  Service: Gastroenterology;;  . POLYPECTOMY  03/20/2019   Procedure: POLYPECTOMY;  Surgeon: Ronnette Juniper, MD;  Location: [facility] ENDOSCOPY;  Service: Gastroenterology;;  . REPAIR OF PERFORATED ULCER  2010  ?       No family history on file.  Social History   Tobacco Use  . Smoking status: Current Some Day Smoker    Packs/day: 0.25    Types: Cigarettes  . Smokeless tobacco: Never Used  Substance Use Topics  . Alcohol use: Yes    Alcohol/week: 1.0 standard drinks    Types: 1 Shots of liquor per week    Comment: liquor every day , one gallon a day   . Drug use: Yes    Types: Cocaine    Comment: every day    Home Medications Prior to Admission medications   Medication Sig Start Date End Date Taking? Authorizing Provider  furosemide (LASIX) 40 MG tablet Take 1 tablet (40 mg total) by  mouth daily. 03/29/19   Jean Rosenthal, MD  pantoprazole (PROTONIX) 40 MG tablet Take 1 tablet (40 mg total) by mouth daily. 03/28/19 05/27/19  Jean Rosenthal, MD  spironolactone (ALDACTONE) 100 MG tablet Take 1 tablet (100 mg total) by mouth daily. 03/29/19   Jean Rosenthal, MD    Allergies    Patient has no known allergies.  Review of Systems   Review of Systems  Unable to perform ROS: Other    Physical Exam Updated Vital Signs BP (!) 153/62   Pulse (!) 106    Temp (!) 97.5 F (36.4 C) (Axillary)   Resp 15   SpO2 100%   Physical Exam Vitals and nursing note reviewed.  Constitutional:      Appearance: He is well-developed.     Comments: Awake, altered, flailing around in bed, appears jaundiced  HENT:     Head: Normocephalic and atraumatic.  Eyes:     General: Scleral icterus present.     Conjunctiva/sclera: Conjunctivae normal.     Pupils: Pupils are equal, round, and reactive to light.  Cardiovascular:     Rate and Rhythm: Regular rhythm. Tachycardia present.     Heart sounds: Normal heart sounds.  Pulmonary:     Effort: Pulmonary effort is normal.     Breath sounds: Normal breath sounds.  Abdominal:     General: Bowel sounds are normal.     Palpations: Abdomen is soft.     Hernia: A hernia is present.     Comments: Large ventral hernia noted  Musculoskeletal:        General: Normal range of motion.     Cervical back: Normal range of motion.  Skin:    General: Skin is warm and dry.     Coloration: Skin is jaundiced.  Neurological:     Mental Status: He is oriented to person, place, and time.     ED Results / Procedures / Treatments   Labs (all labs ordered are listed, but only abnormal results are displayed) Labs Reviewed  CBC WITH DIFFERENTIAL/PLATELET - Abnormal; Notable for the following components:      Result Value   WBC 11.2 (*)    RBC 2.39 (*)    Hemoglobin 7.2 (*)    HCT 23.5 (*)    RDW 16.4 (*)    Neutro Abs 8.5 (*)    All other components within normal limits  COMPREHENSIVE METABOLIC PANEL - Abnormal; Notable for the following components:   Chloride 116 (*)    CO2 13 (*)    Glucose, Bld 156 (*)    BUN 33 (*)    Calcium 8.7 (*)    Total Protein 5.1 (*)    Albumin 2.2 (*)    All other components within normal limits  VITAMIN B12 - Abnormal; Notable for the following components:   Vitamin B-12 1,055 (*)    All other components within normal limits  IRON AND TIBC - Abnormal; Notable for the following  components:   Iron 14 (*)    Saturation Ratios 4 (*)    All other components within normal limits  RETICULOCYTES - Abnormal; Notable for the following components:   Retic Ct Pct 9.4 (*)    RBC. 2.35 (*)    Retic Count, Absolute 220.9 (*)    Immature Retic Fract 29.9 (*)    All other components within normal limits  POCT I-STAT 7, (LYTES, BLD GAS, ICA,H+H) - Abnormal; Notable for the following components:   pH,  Arterial 7.505 (*)    pCO2 arterial 18.7 (*)    pO2, Arterial 112.0 (*)    Bicarbonate 14.8 (*)    TCO2 15 (*)    Acid-base deficit 7.0 (*)    HCT 22.0 (*)    Hemoglobin 7.5 (*)    All other components within normal limits  SARS CORONAVIRUS 2 (TAT 6-24 HRS)  FOLATE  FERRITIN  AMMONIA  ETHANOL  PROTIME-INR  APTT  CBC  COMPREHENSIVE METABOLIC PANEL  BLOOD GAS, ARTERIAL  TYPE AND SCREEN  PREPARE RBC (CROSSMATCH)    EKG None  Radiology No results found.  Procedures Procedures (including critical care time)  CRITICAL CARE Performed by: Larene Pickett   Total critical care time: 45 minutes  Critical care time was exclusive of separately billable procedures and treating other patients.  Critical care was necessary to treat or prevent imminent or life-threatening deterioration.  Critical care was time spent personally by me on the following activities: development of treatment plan with patient and/or surrogate as well as nursing, discussions with consultants, evaluation of patient's response to treatment, examination of patient, obtaining history from patient or surrogate, ordering and performing treatments and interventions, ordering and review of laboratory studies, ordering and review of radiographic studies, pulse oximetry and re-evaluation of patient's condition.   Medications Ordered in ED Medications - No data to display  ED Course  I have reviewed the triage vital signs and the nursing notes.  Pertinent labs & imaging results that were available  during my care of the patient were reviewed by me and considered in my medical decision making (see chart for details).    MDM Rules/Calculators/A&P  64 year old male presenting to the ED as a transfer from [facility] for admission with GI bleed.  He was just released from this facility on 03/28/2019 after admission for same in which he underwent endoscopy, colonoscopy x2, and received a total of 9 units PRBCs.  On arrival, he is awake, flailing around in bed, fidgeting, and clinically appears to encephalopathic, possibly with some withdrawal as well.  He is jaundiced.  He is tachycardic around 105 but his blood pressure appears stable.  He did receive 1 unit of PRBCs and lactulose enema prior to transfer.  Will give dose of Ativan, initiate CIWA protocol.  Repeat labs are pending to assess his hemoglobin and ammonia currently.  Will start Protonix bolus and drip.  1:36 AM Patient becoming hypotensive to around 97/63.  He is much more calm after ativan, resting comfortably at this time.  Hemoglobin here is only up to 7.2.  Will give liter of fluids and plan to transfuse 2 more units PRBCs.  Will plan to re-admit to IM teaching service.  Discussed with teaching service--they will admit.  May need transfer to tertiary care center as this was recommended during last admission if ongoing issues, however transfer was unsuccessful at that time.  Logistically, do not think this will happen in the middle of the night so will plan to admit here and transfer out if needed.  Will consult GI to help with this.  2:28 AM Spoke with [facility], Dr. Michail Sermon-- medical management for now, they will see in AM.  Can discuss options of treatment/goals of care at that time.  Final Clinical Impression(s) / ED Diagnoses Final diagnoses:  Acute GI bleeding  Other cirrhosis of liver ([place])    Rx / DC Orders ED Discharge Orders    None       Quincy Carnes  Curt Jews [phone] [id number]    Delora Fuel, MD [phone]  [phone]

## 2019-03-30 NOTE — Progress Notes (Signed)
Pt is A&Ox3. Pt is calm and resting, not attempting to pull lines. Explained to pt that nurse was going to discontinue restraints if he won't pull NG tube or IV. Pt stated understanding. D/c bilateral wrist restraints. Applied green safety mittens. Will continue to monitor pt. Ranelle Oyster, RN

## 2019-03-30 NOTE — ED Notes (Addendum)
IV started, Blood is now entering through the newly placed 20G IV in the pts Left Arm at the rate of 164ml/hr.

## 2019-03-30 NOTE — ED Notes (Signed)
Per Radiology, patients NG has been placed but they believe he will attempt to pull it out. An order for restraints was placed at 1054. Attending will be notified that we will use restraints and to order any other neccessary interventions for comfort.

## 2019-03-30 NOTE — Progress Notes (Signed)
Subjective: HD#0 Events Overnight: Pateint was admitted overnight.  Patient was seen this morning on rounds. Patient was not alert or oriented during my exam.   Objective:  Vital signs in last 24 hours: Vitals:   03/30/19 0845 03/30/19 0900 03/30/19 0915 03/30/19 0930  BP: (!) 125/100 (!) 102/42 (!) 99/49 (!) 106/45  Pulse: 87 76 80 76  Resp: 18 10 11  (!) 9  Temp:      TempSrc:      SpO2: 100% 100% 99% 100%   Supplemental O2: Room air  Physical Exam: Physical Exam  Constitutional: He appears unhealthy. He appears cachectic. He appears distressed.  Cardiovascular:  Intermittent periods of tachycardia with intact distal pulses and greater than 3 seconds for capillary refill.  Pulmonary/Chest:  Patient had normal breaths without respiratory distress.  Lungs were clear to auscultation bilaterally.  Abdominal:  Abdominal exam was negative for distention or fluid wave.  Bedside ultrasound did not show any signs of ascites.  Musculoskeletal:        General: No tenderness or edema.  Neurological: He displays no tremor.  Patient is not alert or oriented at this time.  He is writhing around in bed and appears to be distressed.  No tremor or asterixis appreciated on exam.    There were no vitals filed for this visit.   Intake/Output Summary (Last 24 hours) at 03/30/2019 1058 Last data filed at 03/30/2019 1057 Gross per 24 hour  Intake 630 ml  Output --  Net 630 ml    Risk Score:  Meld Score: pending lab results Child-Pugh: pending lab results  Pertinent labs/Imaging: CBC Latest Ref Rng & Units 03/30/2019 03/30/2019 03/28/2019  WBC 4.0 - 10.5 K/uL - 11.2(H) 7.6  Hemoglobin 13.0 - 17.0 g/dL 7.5(L) 7.2(L) 7.0(L)  Hematocrit 39.0 - 52.0 % 22.0(L) 23.5(L) 21.7(L)  Platelets 150 - 400 K/uL - 291 240    CMP Latest Ref Rng & Units 03/30/2019 03/30/2019 03/28/2019  Glucose 70 - 99 mg/dL - 156(H) 122(H)  BUN 8 - 23 mg/dL - 33(H) 20  Creatinine 0.61 - 1.24 mg/dL - 1.12 0.95  Sodium  135 - 145 mmol/L 145 143 138  Potassium 3.5 - 5.1 mmol/L 3.8 4.0 4.0  Chloride 98 - 111 mmol/L - 116(H) 109  CO2 22 - 32 mmol/L - 13(L) 21(L)  Calcium 8.9 - 10.3 mg/dL - 8.7(L) 8.5(L)  Total Protein 6.5 - 8.1 g/dL - 5.1(L) 4.6(L)  Total Bilirubin 0.3 - 1.2 mg/dL - 1.2 0.8  Alkaline Phos 38 - 126 U/L - 55 62  AST 15 - 41 U/L - 39 33  ALT 0 - 44 U/L - 25 23    No results found.   Assessment/Plan:  Active Problems:   Upper gastrointestinal bleed    Patient Summary: Gavin Arroyo is a 64 y.o. with pertinent PMH of GI bleed, ventral hernia, and compensated liver cirrhosis who presented with acute encephelopathy and admit for decompensated cirrhosis in the setting of persistent GI bleed on hospital day 0   #Acute encephalopathy, multifactorial: Patient presented to [facility] after recently being discharged from [facility] with a hemoglobin of 5.8. He was given 1 unit of PRBCs and transferred to [facility] where he received another unit.  Patient's hemoglobin has since improved to 7.5. On exam today the patient remains encephalopathic.  During his previous hospitalization the patient was started on diuretics for his decompensated cirrhosis.  These medications were held in the setting of low blood pressures and ongoing  GI bleed.  Bedside ultrasound shows no evidence of ascites.  Patient was given Ativan on presentation to [facility] and in the ED at [facility] due to concerns for alcohol ingestion prior to admission.  The combination of his hepatic encephalopathy and toxic metabolite accumulation secondary to Ativan is likely the underlying etiology of his acute change in mentation.  Patient was given lactulose enemas with minimal stool output.  Patient's ethanol level is pending.  Patient had none anion gap acidosis with likely respiratory compensation. He is not alert and oriented enough to tolerate p.o. medications at this time, therefore I will order an NG tube placement today with subsequent lactulose  per tube.  GI was consulted from the ED.  I appreciate GI's assistance with this difficult case. -Continue to hold off on Ativan/benzodiazepine use -NG tube placement, IR contacted for tube placement due to varices  -Lactulose per tube, titrate to achieve 2-3 bowel movements per day. -Place Flexi-Seal as needed for BM - Continue CIWA without ativan   GI Bleed:  [facility] with a hemoglobin of 5.8.  He received 1 unit of PRBCs at [facility] and subsequently received 2 more units in the ED at [facility].  Patient's hemoglobin has improved now at 7.5.  GI was consulted in the ED.  Waiting for posttransfusion H&H at this time.  Laboratory evaluation for anemia showed B12, folate were within normal limits.  Iron was low at 14 with an elevated TIBC and ferritin of 53 indicative of iron deficiency anemia.  Patient's reticulocyte index was 2.63 indicative of a adequate response to low hemoglobin levels. -Posttransfusion H&H  -We really appreciate GIs assistance with this difficult case  -Keep n.p.o. due to altered mental status -Continue PPI   Liver Cirrhosis:  Patient with relatively new history of cirrhosis.  Last admission patient showed signs and symptoms of decompensation.  Lactulose enemas were used with minimal success and only 1-2 bowel movements since admission.  We will need to put an NG tube in order to give lactulose.  IR was consulted for tube placement. I appreciate their assistance. -IR consulted for NG tube placement. -Start lactulose once tube placed. -Put in soft restraints as needed for tube placement. -Hold Ativan/benzos  Diet: NPO IVF: None,None VTE: SCDs Code: Full PT/OT recs: None TOC recs: None   Dispo: Anticipated discharge pending clinical improvement.    Marianna Payment, D.O. [facility], PGY-1 Date 03/30/2019 Time 10:58 AM

## 2019-03-30 NOTE — ED Notes (Signed)
Attending Provider at bedside. Mouth swab performed.

## 2019-03-30 NOTE — ED Notes (Signed)
3 rd unit blood in line flushed

## 2019-03-30 NOTE — Progress Notes (Signed)
PT Cancellation Note  Patient Details Name: ERICKA ANTOSH MRN: [id number] DOB: [birth year]   Cancelled Treatment:    Reason Eval/Treat Not Completed: Medical issues which prohibited therapy Pt with low BP and receiving blood. Will hold until medically appropriate and follow up as schedule allows.   Lou Miner, DPT  Acute Rehabilitation Services  Pager: [phone] Office: [phone]    Rudean Hitt 03/30/2019, 12:08 PM

## 2019-03-30 NOTE — Consult Note (Signed)
Referring Provider: Dr. Roxanne Mins (ED) Primary Care Physician:  Patient, No Pcp Per Primary Gastroenterologist: Althia Forts  Reason for Consultation: GI bleeding  HPI: Gavin Arroyo is a 64 y.o. male with history of alcoholic cirrhosis and recent post polypectomy bleeding presenting with anemia and altered mental status. Patient was recently hospitalized with GI bleeding and discharged 2 days ago (03/28/2019).  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  Colonoscopy on 03/16/2019 showed three 10 to 15 mm polyps in transverse colon, two 9 to 20 mm polyps in ascending colon, one 25 mm polyp in sigmoid -all polyps were removed.  Patient had persistent melena after procedure, and repeat colonoscopy was performed on 03/20/2019 and the proximal transverse colon was unable to be reached due to large ventral hernia.  A localized area of ulcerated mucosa was found in the transverse colon at sites of prior polypectomy, one ulcer had a visible vessel and was clipped.  Patient's hemoglobin normalized with medical therapy (9u pRBCs) and patient was discharged 03/28/19.  Patient transferred from [facility] this morning.  At [facility], hemoglobin of 5.8 and ammonia of 87; he was given 1u pRBCs and lactulose enema.  On arrival to [facility], hemoglobin 7.2 with iron decreased to 14.  T bili, AST, ALT, alk phos within normal limits.  BUN elevated to 33, creatinine normal at 1.12.  PT/INR pending.  Per ED H&P, patient has not been compliant with medications after discharge 2 days ago and has been having melena.  Patient is disoriented and unable to provide any history.  RN states that she has not seen any melena, hematochezia, hematemesis since patient was brought into the ED. Unable to get any history from the patient and he is not following any commands. Lying in a fetal position on his left  side.   Past Medical History:  Diagnosis Date  . GI bleeding 03/2019    Past Surgical History:  Procedure Laterality Date  . COLONOSCOPY WITH PROPOFOL N/A 03/16/2019   Procedure: COLONOSCOPY WITH PROPOFOL;  Surgeon: Ronnette Juniper, MD;  Location: [place];  Service: Gastroenterology;  Laterality: N/A;  . COLONOSCOPY WITH PROPOFOL N/A 03/20/2019   Procedure: COLONOSCOPY WITH PROPOFOL;  Surgeon: Ronnette Juniper, MD;  Location: [place];  Service: Gastroenterology;  Laterality: N/A;  . ESOPHAGOGASTRODUODENOSCOPY (EGD) WITH PROPOFOL N/A 03/16/2019   Procedure: ESOPHAGOGASTRODUODENOSCOPY (EGD) WITH PROPOFOL;  Surgeon: Ronnette Juniper, MD;  Location: [place];  Service: Gastroenterology;  Laterality: N/A;  . HEMOSTASIS CLIP PLACEMENT  03/20/2019   Procedure: HEMOSTASIS CLIP PLACEMENT;  Surgeon: Ronnette Juniper, MD;  Location: [facility] ENDOSCOPY;  Service: Gastroenterology;;  . HEMOSTASIS CONTROL  03/20/2019   Procedure: HEMOSTASIS CONTROL;  Surgeon: Ronnette Juniper, MD;  Location: [facility] ENDOSCOPY;  Service: Gastroenterology;;  . POLYPECTOMY  03/16/2019   Procedure: POLYPECTOMY;  Surgeon: Ronnette Juniper, MD;  Location: [facility] ENDOSCOPY;  Service: Gastroenterology;;  . POLYPECTOMY  03/20/2019   Procedure: POLYPECTOMY;  Surgeon: Ronnette Juniper, MD;  Location: [facility] ENDOSCOPY;  Service: Gastroenterology;;  . REPAIR OF PERFORATED ULCER  2010  ?    Prior to Admission medications   Medication Sig Start Date End Date Taking? Authorizing Provider  furosemide (LASIX) 40 MG tablet Take 1 tablet (40 mg total) by mouth daily. 03/29/19   Jean Rosenthal, MD  pantoprazole (PROTONIX) 40 MG tablet Take 1 tablet (40 mg total) by mouth daily. 03/28/19 05/27/19  Jean Rosenthal, MD  spironolactone (ALDACTONE) 100 MG tablet Take 1 tablet (100 mg total) by mouth daily. 03/29/19   Jean Rosenthal, MD    Scheduled Meds: . folic acid  1 mg Intravenous Daily  . lactulose  300 mL Rectal Once  . multivitamin with minerals  1 tablet Oral Daily  . [START ON  04/02/2019] pantoprazole  40 mg Intravenous Q12H  . thiamine  100 mg Intravenous Daily   Continuous Infusions: . sodium chloride    . pantoprozole (PROTONIX) infusion 8 mg/hr (03/30/19 0236)   PRN Meds:.acetaminophen **OR** acetaminophen, promethazine, senna-docusate  Allergies as of 03/30/2019  . (No Known Allergies)    No family history on file.  Social History   Socioeconomic History  . Marital status: Single    Spouse name: Not on file  . Number of children: Not on file  . Years of education: Not on file  . Highest education level: Not on file  Occupational History  . Not on file  Tobacco Use  . Smoking status: Current Some Day Smoker    Packs/day: 0.25    Types: Cigarettes  . Smokeless tobacco: Never Used  Substance and Sexual Activity  . Alcohol use: Yes    Alcohol/week: 1.0 standard drinks    Types: 1 Shots of liquor per week    Comment: liquor every day , one gallon a day   . Drug use: Yes    Types: Cocaine    Comment: every day  . Sexual activity: Not on file  Other Topics Concern  . Not on file  Social History Narrative  . Not on file   Social Determinants of Health   Financial Resource Strain:   . Difficulty of Paying Living Expenses:   Food Insecurity:   . Worried About Charity fundraiser in the Last Year:   . Arboriculturist in the Last Year:   Transportation Needs:   . Film/video editor (Medical):   Marland Kitchen Lack of Transportation (Non-Medical):   Physical Activity:   . Days of Exercise per Week:   . Minutes of Exercise per Session:   Stress:   . Feeling of Stress :   Social Connections:   . Frequency of Communication with Friends and Family:   . Frequency of Social Gatherings with Friends and Family:   . Attends Religious Services:   . Active Member of Clubs or Organizations:   . Attends Archivist Meetings:   Marland Kitchen Marital Status:   Intimate Partner Violence:   . Fear of Current or Ex-Partner:   . Emotionally Abused:   Marland Kitchen  Physically Abused:   . Sexually Abused:     Review of Systems: As noted in HPI, patient disoriented and unable to provide any history. Physical Exam: Vital signs: Vitals:   03/30/19 0645 03/30/19 0700  BP: 132/76 100/61  Pulse: 91 83  Resp: (!) 21 12  Temp:    SpO2: 100% 100%  T 97.9   General:  Somnolent, unable to follow commands, cachetic, disheveled, no acute distress Head: normocephalic, atraumatic Eyes: anicteric sclera, conjunctival pallor Lungs:  Clear throughout to auscultation.   No wheezes, crackles, or rhonchi. No acute distress. Heart:  Regular rate and rhythm; no murmurs, clicks, rubs,  or gallops. Abdomen: Soft, nondistended, nontender, large ventral hernia, normoactive bowel sounds. Rectal:  Deferred Ext: no edema  GI:  Lab Results: Recent Labs    03/28/19 0302 03/28/19 0302 03/28/19 1206 03/30/19 0046 03/30/19 0501  WBC 8.6  --  7.6 11.2*  --   HGB 7.0*   < > 7.0* 7.2* 7.5*  HCT 21.8*   < > 21.7* 23.5* 22.0*  PLT 214  --  240 291  --    < > = values in this interval not displayed.   BMET Recent Labs    03/28/19 0302 03/30/19 0046 03/30/19 0501  NA 138 143 145  K 4.0 4.0 3.8  CL 109 116*  --   CO2 21* 13*  --   GLUCOSE 122* 156*  --   BUN 20 33*  --   CREATININE 0.95 1.12  --   CALCIUM 8.5* 8.7*  --    LFT Recent Labs    03/30/19 0046  PROT 5.1*  ALBUMIN 2.2*  AST 39  ALT 25  ALKPHOS 55  BILITOT 1.2   PT/INR Recent Labs    03/27/19 1125 03/28/19 0734  LABPROT 15.1 15.7*  INR 1.2 1.3*     Studies/Results: No results found.  Impression/Plan: 1. Decompensated alcoholic cirrhosis with Hepatic encephalopathy.  -Lactulose enemas BID. Ordered one additional lactulose enema for this evening (patient received one at [facility] ED and one in [facility] ED)  - Not a transplant candidate due to ongoing alcohol abuse  2. Anemia in the setting of GI blood loss  -Medically manage. Polypectomy site should have healed but if signs of  ongoing bleeding during this hospitalization then will consider a repeat colonoscopy once encephalopathy resolves. Supportive care. NPO due to encephalopathy. Continue to monitor H&H. Transfuse as needed to maintain Hgb >7.    LOS: 0 days   [name]  03/30/2019, 9:09 AM  Questions please call [phone]

## 2019-03-30 NOTE — ED Notes (Signed)
Pt is more verbal and requests for water. Enema has been completed. Patient in no distress will say "god damn" from time to time. Full linen changed. Pt has pulled condom cath off. Large out put from enema with dark and bloody stool. Barrier creme placed on buttocks after being cleaned with soap and water. Soft restraints were reinforced.

## 2019-03-30 NOTE — ED Notes (Signed)
CBC ,CMP  Will be drawn after 3rd unit of RBC's  Infuses.

## 2019-03-30 NOTE — ED Notes (Signed)
Paged Dr. Lovena Neighbours, floating IM Resident to report pt ABG critical values.

## 2019-03-30 NOTE — Progress Notes (Signed)
Gavin Arroyo is a 64 y.o. male patient admitted from ED awake, alert - oriented  X 4 - no acute distress noted.  VSS - Blood pressure 107/61, pulse 73, temperature 98.5 F (36.9 C), temperature source Oral, resp. rate 14, SpO2 100 %.    IV in place, occlusive dsg intact without redness.  Orientation to room, and floor completed with information packet given to patient/family. Patient declined safety video at this time.  Admission INP armband ID verified with patient. Call light within reach, patient able to voice, and demonstrate understanding.  Skin, clean-dry- intact without evidence of bruising, or skin tears.   Patient does have an abdominal hernia/mass.      Dene Gentry, RN 03/30/2019 7:54 PM

## 2019-03-30 NOTE — H&P (Addendum)
Date: 03/30/2019               Patient Name:  Gavin Arroyo MRN: [id number]  DOB: Jul 27, 1955 Age / Sex: 64 y.o., male   PCP: Patient, No Pcp Per         Medical Service: Internal Medicine Teaching Service         Attending Physician: Dr. Evette Doffing, Mallie Mussel, *    First Contact: Dr. Marianna Payment Pager: [phone]  Second Contact: Dr. Eileen Stanford Pager: [phone]       After Hours (After 5p/  First Contact Pager: [phone]  weekends / holidays): Second Contact Pager: [phone]   Chief Complaint: GI Bleed, AMS  History of Present Illness:    Patient unable to provide history due to acute encephalopathy.  Gavin Arroyo is a 64 y/o male, with a PMH of GI bleed, ventral hernia, and compensated liver cirrhosis, who presents to [facility] from [place] for a GI bleed. Per [facility], patient presented to [facility] he was found to have a hemoglobin of 5.8 with an elevated ammonia level of 87. He was given a unit of blood, lactulose enema, ativan, and was transferred to [facility] for GI consultation. During his stay at [facility] he was able to sign the consent form to receive blood. Upon arrival to [facility], he was found to be aggressive and was given another ativan injection.  Patient unable to participate in interview due to altered mentation. History gathered via chart review and speaking with ED staff.   During his ED course at [facility], Gavin Arroyo was found to be tachycardic. He became hypotensive around 97/63 with a hemoglobin of 7.2. Was given liter of fluids with 2 more unites of PRBCs. GI was consulted and IMTS will admit.   Meds:  Spironolactone 100 mg daily Lasix 40 mg daily Pantoprazole 40 mg daily  Allergies: Allergies as of 03/30/2019   (No Known Allergies)   Past Medical History:  Diagnosis Date   GI bleeding 03/2019    Family History: Unable to obtain due to encephalopathy  Social History:  Unable to obtain due to encephalopathy  Review of Systems: Unable to obtain due to  encephalopathy  Physical Exam: Blood pressure 123/63, pulse 81, temperature 97.8 F (36.6 C), temperature source Axillary, resp. rate 12, SpO2 100 %.  Physical Exam Constitutional:      Appearance: He is normal weight. He is not diaphoretic.     Comments: Somnolent, moving all extremities.  HENT:     Head: Normocephalic and atraumatic.     Mouth/Throat:     Mouth: Mucous membranes are moist.     Comments: Multiple dental carries and missing teeth.  Eyes:     General: No scleral icterus.       Right eye: No discharge.        Left eye: No discharge.     Pupils: Pupils are equal, round, and reactive to light.  Cardiovascular:     Rate and Rhythm: Normal rate and regular rhythm.     Pulses: Normal pulses.     Heart sounds: Normal heart sounds. No murmur. No friction rub. No gallop.   Pulmonary:     Effort: Pulmonary effort is normal.     Breath sounds: Normal breath sounds.  Abdominal:     General: Bowel sounds are normal.     Palpations: Abdomen is soft.     Tenderness: There is no abdominal tenderness. There is no guarding.  Comments: protuberant ventral hernia, with surgical scar vertically across the mid abdomen. BS+, nontender, no guarding.   Genitourinary:    Comments: Anorectal: No visible anal tears/fissures/hemmorrhoids, sphincter tone intact, prostate normal size without irregularities on palpation, upon withdrawal dark stool appreciated on glove.     EKG: personally reviewed my interpretation is sinus tachycardia  CXR: Not ordered.   Assessment & Plan by Problem: Principal Problem:   Acute GI bleeding Active Problems:   Symptomatic anemia   Hepatic cirrhosis (HCC)   Hepatic encephalopathy [facility])  [name] 64 y/o male, with a PMH of GI bleed, ventral hernia, and compensated liver cirrhosis, who presents to [facility] IMTS with a GI Bleed.    Patient with compensated liver cirrhosis and GI bleed presenting to the [facility] IMTS. Patient recently admitted on  03/19/19 and discharged on 03/26/19 with GI bleed. He underwent endoscopy twice, and was given 9 units of PRBC during his hospitalization. It was recommended at discharge that if his bleeding were resume he should be evaluated at a tertiary center like [facility] [place] as recommended by GI due to being unable to pass a scope through transverse colon within the ventral hernia. Transfer to tertiary center was unsuccessful during his previous hospitalization. Patient has received 2 units of blood during ED course. Patient in need to be stabilized before possibility of transfer to tertiary care center.    Patient presented with altered mental status, and was found to be aggressive and given a total of 2 mg of Ativan. Patient was somnolent at bedside likely 2/2 to benzodiazapine usage. He did have an elevated ammonia at [facility]. ETOH level is pending. No appreciable electrolyte abnormalities. While patient does have elevated leukocytosis this is likely due to stress, as he does not have other signs of infection.   GI Bleed:  Patient previously admitted with GI bleed. Received 2 units of PRBC.  - ED consulted with GI, we appreciate their recommendations.  - Currently receiving second transfusion - Follow up on H and H  - NPO at this time - Continue to monitor vitals  - Ordered Protonix - CBC AM ordered  Altered Mental Status:  Patient with decompensated liver cirrhosis, s/p 2mg  Ativan, may be sedated due benzodiazapine use. Found to have an elevated ammonia at [facility].  - Follow up ethanol level - Follow up ABG - Continue to monitor  - Discontinued Ativan w/ CIWA - Continue CIWA - Ordered lactulose  Liver Cirrhosis:  Patient with PMH of liver cirrhosis with elevated PT/INR in the ED.  - Started Lactulose - Ammonia Pending - Rifaximin 550 mg ordered.  - CMP AM ordered  Ventral Hernia:   - Continue observation.    Dispo: Admit patient to Inpatient with expected length of stay greater than  2 midnights.  Signed: Maudie Mercury, MD 03/30/2019, 4:27 AM

## 2019-03-30 NOTE — ED Triage Notes (Signed)
Pt transported by [facility] from [place] where his hgb was 5.8 and pt received 1 unit blood. Pt ammonia was 87 and received lactulose enema.

## 2019-03-30 NOTE — ED Notes (Signed)
Dr. Gilford Rile responded to RN concern for patients critical pCO2 value. MD states "will review chart before adding orders"

## 2019-03-30 NOTE — ED Notes (Signed)
Request sent for emema tubing to be sent down to ED.

## 2019-03-30 NOTE — ED Notes (Signed)
Pt transported on cardiac monitor to Radiology for NG tube placement.

## 2019-03-30 NOTE — ED Notes (Signed)
Morning labs are to be held d/t patient actively receiving blood transfusion. Lab will be drawn two hours post blood transfusion completion.

## 2019-03-30 NOTE — ED Notes (Addendum)
Pt blood transfusion started at [place]. RN noticed blood leaking from Right Wrist IV site. RN stopped blood transfusion and stopped Protonix and Saline Bolus that was being administered in the Left AC. Flushed line with 62ml NS. Blood was starting to infuse in 18G Left IV site at 0217.

## 2019-03-30 NOTE — ED Notes (Signed)
Oral medications held at this time. Pt is unable to follow commands at this time. Admitting aware.

## 2019-03-31 DIAGNOSIS — G9340 Encephalopathy, unspecified: Secondary | ICD-10-CM

## 2019-03-31 LAB — CBC
HCT: 24.3 % — ABNORMAL LOW (ref 39.0–52.0)
Hemoglobin: 8 g/dL — ABNORMAL LOW (ref 13.0–17.0)
MCH: 29.7 pg (ref 26.0–34.0)
MCHC: 32.9 g/dL (ref 30.0–36.0)
MCV: 90.3 fL (ref 80.0–100.0)
Platelets: 205 10*3/uL (ref 150–400)
RBC: 2.69 MIL/uL — ABNORMAL LOW (ref 4.22–5.81)
RDW: 16.9 % — ABNORMAL HIGH (ref 11.5–15.5)
WBC: 7.5 10*3/uL (ref 4.0–10.5)
nRBC: 0 % (ref 0.0–0.2)

## 2019-03-31 LAB — COMPREHENSIVE METABOLIC PANEL
ALT: 25 U/L (ref 0–44)
AST: 39 U/L (ref 15–41)
Albumin: 2 g/dL — ABNORMAL LOW (ref 3.5–5.0)
Alkaline Phosphatase: 53 U/L (ref 38–126)
Anion gap: 6 (ref 5–15)
BUN: 26 mg/dL — ABNORMAL HIGH (ref 8–23)
CO2: 16 mmol/L — ABNORMAL LOW (ref 22–32)
Calcium: 7.9 mg/dL — ABNORMAL LOW (ref 8.9–10.3)
Chloride: 123 mmol/L — ABNORMAL HIGH (ref 98–111)
Creatinine, Ser: 1.04 mg/dL (ref 0.61–1.24)
GFR calc Af Amer: >60 mL/min (ref >60)
GFR calc non Af Amer: >60 mL/min (ref >60)
Glucose, Bld: 96 mg/dL (ref 70–99)
Potassium: 3.3 mmol/L — ABNORMAL LOW (ref 3.5–5.1)
Sodium: 145 mmol/L (ref 135–145)
Total Bilirubin: 1.8 mg/dL — ABNORMAL HIGH (ref 0.3–1.2)
Total Protein: 4.6 g/dL — ABNORMAL LOW (ref 6.5–8.1)

## 2019-03-31 LAB — PROTIME-INR
INR: 1.4 — ABNORMAL HIGH (ref 0.8–1.2)
Prothrombin Time: 17.1 seconds — ABNORMAL HIGH (ref 11.4–15.2)

## 2019-03-31 LAB — MRSA PCR SCREENING: MRSA by PCR: NEGATIVE

## 2019-03-31 LAB — AMMONIA: Ammonia: 38 umol/L — ABNORMAL HIGH (ref 9–35)

## 2019-03-31 LAB — APTT: aPTT: 31 seconds (ref 24–36)

## 2019-03-31 LAB — ETHANOL: Alcohol, Ethyl (B): <10 mg/dL (ref <10)

## 2019-03-31 MED ORDER — LACTULOSE 10 GM/15ML PO SOLN
20.0000 g | Freq: Three times a day (TID) | ORAL | Status: DC
  Administered 2019-03-31 – 2019-04-01 (×6): 20 g via ORAL
  Filled 2019-03-31 (×6): qty 30

## 2019-03-31 MED ORDER — SPIRONOLACTONE 25 MG PO TABS
100.0000 mg | ORAL_TABLET | Freq: Every day | ORAL | Status: DC
  Administered 2019-03-31 – 2019-04-01 (×2): 100 mg via ORAL
  Filled 2019-03-31 (×2): qty 4

## 2019-03-31 MED ORDER — ORAL CARE MOUTH RINSE
15.0000 mL | Freq: Two times a day (BID) | OROMUCOSAL | Status: DC
  Administered 2019-03-31 – 2019-04-17 (×20): 15 mL via OROMUCOSAL

## 2019-03-31 MED ORDER — FUROSEMIDE 40 MG PO TABS
40.0000 mg | ORAL_TABLET | Freq: Every day | ORAL | Status: DC
  Administered 2019-03-31 – 2019-04-01 (×2): 40 mg via ORAL
  Filled 2019-03-31 (×2): qty 1

## 2019-03-31 MED ORDER — POTASSIUM CHLORIDE 10 MEQ/100ML IV SOLN
10.0000 meq | INTRAVENOUS | Status: AC
  Administered 2019-03-31 (×3): 10 meq via INTRAVENOUS
  Filled 2019-03-31 (×3): qty 100

## 2019-03-31 MED ORDER — CHLORHEXIDINE GLUCONATE 0.12 % MT SOLN
15.0000 mL | Freq: Two times a day (BID) | OROMUCOSAL | Status: DC
  Administered 2019-03-31 – 2019-04-17 (×31): 15 mL via OROMUCOSAL
  Filled 2019-03-31 (×33): qty 15

## 2019-03-31 MED ORDER — LACTULOSE ENEMA
300.0000 mL | Freq: Once | ORAL | Status: DC
  Filled 2019-03-31: qty 300

## 2019-03-31 NOTE — Progress Notes (Signed)
Occupational Therapy Evaluation Patient Details Name: Gavin Arroyo MRN: [id number] DOB: [birth year] Today's Date: 03/31/2019    History of Present Illness  64 y/o male, with a PMH of GI bleed, ventral hernia, and compensated liver cirrhosis, who presents to [facility] from [place] for a GI bleed. Per [facility], patient presented to [facility] he was found to have a hemoglobin of 5.8 with an elevated ammonia level of 87.   Clinical Impression   PTA pt resided with ex wife, independent in all ADL, IADL, and mobility tasks. Pt does not ambulate with an AD and reports 0 falls in the last 6 months. Pt currently independent to mod assist for self-care and mobility tasks. Pt able to ambulate to bathroom without an AD requiring min assist for balance. Pt tolerated standing ~3 min at the sink to complete hygiene and grooming tasks with min guard. Pt able to ambulate additional 5+ min with RW out in hallway. Pt initially required min assist, however progressed to mod assist for balance at end of mobility task. Pt noted to be running RW into objects on right side with mod to max cues to correct and scan environment. Poor follow through. Pt demonstrates decreased strength, endurance, balance, standing tolerance, activity tolerance, safety awareness, and visual perceptual deficits impacting ability to complete self-care and functional transfer tasks. Recommend skilled OT services to promote function and prevent further decline. Recommend [facility] OT for continued rehab following hospital discharge.     Follow Up Recommendations  Home health OT;Supervision/Assistance - 24 hour    Equipment Recommendations  None recommended by OT    Recommendations for Other Services       Precautions / Restrictions Precautions Precautions: Fall Restrictions Weight Bearing Restrictions: No      Mobility Bed Mobility Overal bed mobility: Needs Assistance Bed Mobility: Supine to Sit     Supine to sit: HOB elevated;Supervision      General bed mobility comments: Use of bed rail  Transfers Overall transfer level: Needs assistance   Transfers: Sit to/from Stand Sit to Stand: Min guard         General transfer comment: Able to complete without use of an AD. Noted 0 instances of LOB.     Balance Overall balance assessment: Needs assistance Sitting-balance support: Feet supported Sitting balance-Leahy Scale: Fair       Standing balance-Leahy Scale: Poor                             ADL either performed or assessed with clinical judgement   ADL Overall ADL's : Needs assistance/impaired Eating/Feeding: Independent;Sitting   Grooming: Min guard;Standing   Upper Body Bathing: Set up;Supervision/ safety;Sitting   Lower Body Bathing: Supervison/ safety;Min guard;Sit to/from stand   Upper Body Dressing : Set up;Supervision/safety;Sitting   Lower Body Dressing: Supervision/safety;Min guard;Sit to/from stand   Toilet Transfer: Min guard;Ambulation;Regular Toilet;Grab bars   Toileting- Clothing Manipulation and Hygiene: Min guard;Sit to/from stand       Functional mobility during ADLs: Minimal assistance;Moderate assistance;Rolling walker General ADL Comments: Pt able to ambulate to/from bathroom with RW. Pt able to stand and ambulate ~8 min with RW. Pt initially required min assist for balance, however progressed to mod assist towards end of ambulation task.      Vision Baseline Vision/History: Wears glasses Wears Glasses: Reading only Vision Assessment?: Yes Eye Alignment: Within Functional Limits Ocular Range of Motion: Within Functional Limits Tracking/Visual Pursuits: Other (comment)(Decreased tracking to RLQ) Convergence: Impaired -  to be further tested in functional context Additional Comments: Difficulty following test instructions. Peripheral vision impaired on right.     Perception     Praxis      Pertinent Vitals/Pain Pain Assessment: No/denies pain     Hand Dominance  Right   Extremity/Trunk Assessment Upper Extremity Assessment Upper Extremity Assessment: Generalized weakness   Lower Extremity Assessment Lower Extremity Assessment: Defer to PT evaluation       Communication Communication Communication: No difficulties   Cognition Arousal/Alertness: Awake/alert Behavior During Therapy: WFL for tasks assessed/performed Overall Cognitive Status: No family/caregiver present to determine baseline cognitive functioning                                 General Comments: Pt pleasant and willing to participate in therapy. Cues to attend to tasks. Cues for safety.    General Comments  VSS on RA.     Exercises     Shoulder Instructions      Home Living Family/patient expects to be discharged to:: Private residence Living Arrangements: Non-relatives/Friends   Type of Home: House Home Access: Stairs to enter Technical brewer of Steps: 1   Home Layout: One level     Bathroom Shower/Tub: Teacher, early years/pre: Standard Bathroom Accessibility: Yes   Home Equipment: Tub bench;Hand held shower head;Grab bars - tub/shower          Prior Functioning/Environment Level of Independence: Independent        Comments: Pt independent in all ADL, IADL, and mobility tasks. Pt does not ambulate with an AD and reports 0 falls in the last 6 months. Pt does not drive. Pt used to work as a Dealer.         OT Problem List: Decreased strength;Decreased activity tolerance;Impaired balance (sitting and/or standing);Impaired vision/perception;Decreased cognition;Decreased safety awareness      OT Treatment/Interventions: Self-care/ADL training;Therapeutic exercise;Neuromuscular education;Energy conservation;DME and/or AE instruction;Therapeutic activities;Cognitive remediation/compensation;Visual/perceptual remediation/compensation;Patient/family education;Balance training    OT Goals(Current goals can be found in the care  plan section) Acute Rehab OT Goals Patient Stated Goal: Pt agreeable to working with therapy Time For Goal Achievement: 04/14/19 Potential to Achieve Goals: Good ADL Goals Pt Will Perform Grooming: with modified independence;standing Pt Will Perform Lower Body Bathing: with modified independence;sit to/from stand Pt Will Perform Lower Body Dressing: with modified independence;sit to/from stand Pt Will Transfer to Toilet: with modified independence;ambulating;regular height toilet Pt Will Perform Toileting - Clothing Manipulation and hygiene: with modified independence;sit to/from stand Pt Will Perform Tub/Shower Transfer: Tub transfer;with min guard assist;shower seat;grab bars  OT Frequency: Min 3X/week   Barriers to D/C:            Co-evaluation PT/OT/SLP Co-Evaluation/Treatment: Yes Reason for Co-Treatment: For patient/therapist safety;To address functional/ADL transfers   OT goals addressed during session: ADL's and self-care;Strengthening/ROM      AM-PAC OT "6 Clicks" Daily Activity     Outcome Measure Help from another person eating meals?: None Help from another person taking care of personal grooming?: A Little Help from another person toileting, which includes using toliet, bedpan, or urinal?: A Little Help from another person bathing (including washing, rinsing, drying)?: A Little Help from another person to put on and taking off regular upper body clothing?: A Little Help from another person to put on and taking off regular lower body clothing?: A Little 6 Click Score: 19   End of Session Equipment Utilized During Treatment: Gait  belt;Rolling walker Nurse Communication: Mobility status  Activity Tolerance: Patient tolerated treatment well Patient left: in chair;with call bell/phone within reach;with chair alarm set  OT Visit Diagnosis: Unsteadiness on feet (R26.81);Muscle weakness (generalized) (M62.81)                Time: [id number] OT Time Calculation (min):  33 min Charges:  OT General Charges $OT Visit: 1 Visit OT Evaluation $OT Eval Low Complexity: 1 Low  Mauri Brooklyn OTR/L [place] 03/31/2019, 1:21 PM

## 2019-03-31 NOTE — Progress Notes (Signed)
Subjective: HD#1 Events Overnight: Patient had 4 bowel movements overnight. He pulled out his NG tube this morning.   Patient was seen this morning on rounds. Gavin Arroyo is feeling better today but he is feeling thirsty. He denies any abdominal pain.   Objective:  Vital signs in last 24 hours: Vitals:   03/30/19 1800 03/30/19 1910 03/30/19 2000 03/31/19 0416  BP: (!) 125/57 107/61 108/68 (!) 103/59  Pulse: 81 73 82 78  Resp:  14 14 19   Temp:  98.5 F (36.9 C) 98.6 F (37 C) 98.1 F (36.7 C)  TempSrc:  Oral Oral Oral  SpO2: 100% 100% 99% 97%  Weight:    61.8 kg   Supplemental O2: RA  Physical Exam: Physical Exam  Constitutional: He is oriented to person, place, and time.  Patient is much more alert and oriented today.  HENT:  Head: Normocephalic.  Eyes: EOM are normal.  Cardiovascular: Intact distal pulses.  No murmur heard. Pulmonary/Chest: Effort normal and breath sounds normal.  Abdominal: Soft. He exhibits no distension. There is no abdominal tenderness.  Patient's abdomen does not have any fluid wave.  Musculoskeletal:        General: No tenderness or edema. Normal range of motion.     Cervical back: Normal range of motion.  Neurological: He is alert and oriented to person, place, and time.  Skin: Skin is warm and dry.    Filed Weights   03/31/19 0416  Weight: 61.8 kg    Intake/Output Summary (Last 24 hours) at 03/31/2019 [id number] Last data filed at 03/30/2019 2300 Gross per 24 hour  Intake 455 ml  Output 300 ml  Net 155 ml   Bowel Movements: Patient had 4 large liquid, black/red bowel movements overnight.  Risk Score:  MELD: 13 pts, <2% Child-Pugh: B  Pertinent labs/Imaging: CBC Latest Ref Rng & Units 03/31/2019 03/30/2019 03/30/2019  WBC 4.0 - 10.5 K/uL 7.5 8.9 -  Hemoglobin 13.0 - 17.0 g/dL 8.0(L) 8.6(L) 7.5(L)  Hematocrit 39.0 - 52.0 % 24.3(L) 26.4(L) 22.0(L)  Platelets 150 - 400 K/uL 205 212 -    CMP Latest Ref Rng & Units 03/31/2019 03/30/2019  03/30/2019  Glucose 70 - 99 mg/dL 96 112(H) -  BUN 8 - 23 mg/dL 26(H) 29(H) -  Creatinine 0.61 - 1.24 mg/dL 1.04 1.07 -  Sodium 135 - 145 mmol/L 145 144 145  Potassium 3.5 - 5.1 mmol/L 3.3(L) 3.8 3.8  Chloride 98 - 111 mmol/L 123(H) 121(H) -  CO2 22 - 32 mmol/L 16(L) 16(L) -  Calcium 8.9 - 10.3 mg/dL 7.9(L) 8.0(L) -  Total Protein 6.5 - 8.1 g/dL 4.6(L) 4.6(L) -  Total Bilirubin 0.3 - 1.2 mg/dL 1.8(H) 1.5(H) -  Alkaline Phos 38 - 126 U/L 53 48 -  AST 15 - 41 U/L 39 34 -  ALT 0 - 44 U/L 25 22 -  Corrected Ca: 9.5   Assessment/Plan:  Principal Problem:   Acute GI bleeding Active Problems:   Symptomatic anemia   Hepatic cirrhosis (HCC)   Hepatic encephalopathy (HCC)   Patient Summary: Gavin Arroyo is a 64 y.o. with pertinent PMH of GI bleed, ventral hernia, and compensated liver cirrhosis who presented with acute encephelopathy  and admit for decompensated cirrhosis in the setting of persistent GI bleed  on hospital day 1  #Acute encephalopathy, multifactorial: #Hepatic Encephalopathy  Patient is more awake and alert today after lactulose therapy.  We will continue lactulose therapy. -Speech therapy evaluation for p.o. intake. -Continue lactulose  therapy pending speech evaluation titrate to 2-3 bowel movements daily.  GI Bleed: Patient had 4 large bowel movements last night with a mixture of red and dark stool.  We will continue to monitor his hemoglobin as he likely has a continuation of his GI bleed.  Appreciate GIs assistance with this difficult case. Patient's Hgb today was 8.0.  - Continue PPI -Appreciate GIs assistance -Patient will likely need transfer to a tertiary hospital for further evaluation and management  Decompensated liver Cirrhosis:  Did present to the hospital with acute encephalopathy secondary to decompensated cirrhosis.  He is not showing any other signs or symptoms of cirrhosis.  Patient is more awake and alert and oriented today.  We will restart his  diuretic therapy with spironolactone and Lasix. - Restart diuretics - 100 mg spironolactone, 40 mg Lasix - Monitor strict ins and outs -Fluid restricted diet with high-protein once patient can tolerate soft diet. - Continue Lactulose 20 g 3 times daily p.o. titrate this to achieve 2-3 bowel movements daily in the setting of hepatic encephalopathy  Diet: Soft, advance as tolerated with high-protein supplementation. IVF: None,None VTE: SCDs Code: Full PT/OT recs: None TOC recs: none   Dispo: Anticipated discharge pending further evaluation and management.   Marianna Payment, D.O. [facility], PGY-1 Date 03/31/2019 Time 6:27 AM

## 2019-03-31 NOTE — Progress Notes (Signed)
Notified MD oncall that pt's QTC is prolonged on telemetry. QTC is 529. No new orders given. Will continue to monitor pt. Ranelle Oyster, RN

## 2019-03-31 NOTE — Progress Notes (Addendum)
Notified Dr. Gilford Rile that pt pulled out NG tube. NG tube was placed by radiology. Pt has had 4 BMs this shift per NT after receiving lactulose through NG tube. Dr. Gilford Rile stated ok that he will let them know. Will continue to monitor pt. Ranelle Oyster, RN

## 2019-03-31 NOTE — Progress Notes (Signed)
[facility] Progress Note  Gavin Arroyo 64 y.o. 09-21-1955   Subjective: Lying awake in bed in hand restraints. Denies abdominal pain, nausea, or vomiting. Thirsty.  Objective: Vital signs: Vitals:   03/30/19 2000 03/31/19 0416  BP: 108/68 (!) 103/59  Pulse: 82 78  Resp: 14 19  Temp: 98.6 F (37 C) 98.1 F (36.7 C)  SpO2: 99% 97%    Physical Exam: Gen: lethargic, cachetic, disheveled, no acute distress  HEENT: anicteric sclera CV: RRR Chest: CTA B Abd: large ventral hernia, soft, nontender, nondistended, +BS Ext: no edema Neuro: oriented X 3  Lab Results: Recent Labs    03/30/19 1041 03/31/19 0505  NA 144 145  K 3.8 3.3*  CL 121* 123*  CO2 16* 16*  GLUCOSE 112* 96  BUN 29* 26*  CREATININE 1.07 1.04  CALCIUM 8.0* 7.9*   Recent Labs    03/30/19 1041 03/31/19 0505  AST 34 39  ALT 22 25  ALKPHOS 48 53  BILITOT 1.5* 1.8*  PROT 4.6* 4.6*  ALBUMIN 2.0* 2.0*   Recent Labs    03/30/19 0046 03/30/19 0501 03/30/19 1041 03/31/19 0505  WBC 11.2*  --  8.9 7.5  NEUTROABS 8.5*  --   --   --   HGB 7.2*   < > 8.6* 8.0*  HCT 23.5*   < > 26.4* 24.3*  MCV 98.3  --  91.7 90.3  PLT 291  --  212 205   < > = values in this interval not displayed.      Assessment/Plan: Decompensated alcoholic cirrhosis with hepatic encephalopathy that has improved with Lactulose enemas and PO Lactulose. NG tube pulled out that was used to give PO Lactulose. Would NOT replace NG tube with history of esophageal varices. Will give another Lactulose enema today and then should be able to take PO Lactulose by mouth later today if his mental status continues to improve. He states that he is going to stop drinking alcohol but I am not convinced. Ice chips ok and if stable clear liquid diet tomorrow.   [name] 03/31/2019, 10:42 AM  Questions please call [phone] ID: Gavin Arroyo, male   DOB: 1955/09/05, 64 y.o.   MRN: [id number]

## 2019-03-31 NOTE — Evaluation (Signed)
Clinical/Bedside Swallow Evaluation Patient Details  Name: Gavin Arroyo MRN: [id number] Date of Birth: [birth year]  Today's Date: 03/31/2019 Time: SLP Start Time (ACUTE ONLY): 1051 SLP Stop Time (ACUTE ONLY): 1112 SLP Time Calculation (min) (ACUTE ONLY): 21 min  Past Medical History:  Past Medical History:  Diagnosis Date  . GI bleeding 03/2019   Past Surgical History:  Past Surgical History:  Procedure Laterality Date  . COLONOSCOPY WITH PROPOFOL N/A 03/16/2019   Procedure: COLONOSCOPY WITH PROPOFOL;  Surgeon: Ronnette Juniper, MD;  Location: [place];  Service: Gastroenterology;  Laterality: N/A;  . COLONOSCOPY WITH PROPOFOL N/A 03/20/2019   Procedure: COLONOSCOPY WITH PROPOFOL;  Surgeon: Ronnette Juniper, MD;  Location: [place];  Service: Gastroenterology;  Laterality: N/A;  . ESOPHAGOGASTRODUODENOSCOPY (EGD) WITH PROPOFOL N/A 03/16/2019   Procedure: ESOPHAGOGASTRODUODENOSCOPY (EGD) WITH PROPOFOL;  Surgeon: Ronnette Juniper, MD;  Location: [place];  Service: Gastroenterology;  Laterality: N/A;  . HEMOSTASIS CLIP PLACEMENT  03/20/2019   Procedure: HEMOSTASIS CLIP PLACEMENT;  Surgeon: Ronnette Juniper, MD;  Location: [facility] ENDOSCOPY;  Service: Gastroenterology;;  . HEMOSTASIS CONTROL  03/20/2019   Procedure: HEMOSTASIS CONTROL;  Surgeon: Ronnette Juniper, MD;  Location[facility] ENDOSCOPY;  Service: Gastroenterology;;  . POLYPECTOMY  03/16/2019   Procedure: POLYPECTOMY;  Surgeon: Ronnette Juniper, MD;  Location: [facility] ENDOSCOPY;  Service: Gastroenterology;;  . POLYPECTOMY  03/20/2019   Procedure: POLYPECTOMY;  Surgeon: Ronnette Juniper, MD;  Location: [facility] ENDOSCOPY;  Service: Gastroenterology;;  . REPAIR OF PERFORATED ULCER  2010  ?   HPI:  Gavin Arroyo is a 64 y/o male, with a PMH of GI bleed, ventral hernia, and compensated liver cirrhosis, who presents from [place] for a GI bleed. Began to have altered mentation, tacycardia.    Assessment / Plan / Recommendation Clinical Impression  Pt demonstrated functional swallow  across consistencies trialed. Oral-motor ability was within normal limits. Volitional cough is moderately weak, He consumed 3 oz of water via straw with one mild throat clear and consecutive sips water without s/s aspiration. Oral phase to break down solid and transit was adequate however given missing dentition and poor condition, recommend he continue soft texture. Thin liquids are appropriate via straw or cup and pills with water. Educated pt on strategies for safe consumption. No further ST needed.  SLP Visit Diagnosis: Dysphagia, unspecified (R13.10)    Aspiration Risk  Mild aspiration risk    Diet Recommendation Thin liquid(soft)   Liquid Administration via: Cup;Straw Medication Administration: Whole meds with liquid Compensations: Slow rate;Small sips/bites Postural Changes: Seated upright at 90 degrees    Other  Recommendations Oral Care Recommendations: Oral care BID   Follow up Recommendations None      Frequency and Duration            Prognosis        Swallow Study   General HPI: Gavin Arroyo is a 64 y/o male, with a PMH of GI bleed, ventral hernia, and compensated liver cirrhosis, who presents from [place] for a GI bleed. Began to have altered mentation, tacycardia.  Type of Study: Bedside Swallow Evaluation Previous Swallow Assessment: (none) Diet Prior to this Study: NPO Temperature Spikes Noted: No Respiratory Status: Room air History of Recent Intubation: No Behavior/Cognition: Alert;Cooperative;Pleasant mood Oral Cavity Assessment: Within Functional Limits Oral Care Completed by SLP: Yes Oral Cavity - Dentition: Poor condition;Missing dentition Vision: Functional for self-feeding Self-Feeding Abilities: Able to feed self Patient Positioning: Upright in bed Baseline Vocal Quality: Normal Volitional Cough: Weak Volitional Swallow: Able to elicit  Oral/Motor/Sensory Function Overall Oral Motor/Sensory Function: Within functional limits   Ice Chips Ice  chips: Not tested   Thin Liquid Thin Liquid: Within functional limits Presentation: Cup;Straw    Nectar Thick Nectar Thick Liquid: Not tested   Honey Thick Honey Thick Liquid: Not tested   Puree Puree: Within functional limits   Solid     Solid: Within functional limits      [name] 03/31/2019,4:53 PM  [name].Ed Risk analyst [phone] Office [phone]

## 2019-03-31 NOTE — Evaluation (Signed)
Physical Therapy Evaluation Patient Details Name: Gavin Arroyo MRN: [id number] DOB: 08/02/1955 Today's Date: 03/31/2019   History of Present Illness   64 y/o male, with a PMH of GI bleed, ventral hernia, and compensated liver cirrhosis, who presents to [facility] from [place] for a GI bleed. Per [facility], patient presented to [facility] he was found to have a hemoglobin of 5.8 with an elevated ammonia level of 87.  Clinical Impression  Pt is a retired Dealer and PTA was living with his ex-wife and her brother in a single story home with one step to enter. Pt reports independence in limited community ambulation and iADLs. Pt is currently limited in safe mobility by decreased peripheral vision, and decreased knowledge of DME use in the presence of generalized weakness and associated decreased balance and endurance. Pt is supervision for bed mobility, and min guard for transfers. Pt requires modA for ambulation without AD and minA progressing to modA for ambulation of 100 feet with RW. PT recommending HHPT as long as he has 24 hour support at home at discharge. PT will continue to follow acutely.     Follow Up Recommendations Home health PT;Supervision/Assistance - 24 hour    Equipment Recommendations  Rolling walker with 5" wheels;3in1 (PT)       Precautions / Restrictions Precautions Precautions: Fall Restrictions Weight Bearing Restrictions: No      Mobility  Bed Mobility Overal bed mobility: Needs Assistance Bed Mobility: Supine to Sit     Supine to sit: HOB elevated;Supervision     General bed mobility comments: Use of bed rail  Transfers Overall transfer level: Needs assistance   Transfers: Sit to/from Stand Sit to Stand: Min guard         General transfer comment: Able to complete without use of an AD. Noted 0 instances of LOB.   Ambulation/Gait Ambulation/Gait assistance: Min assist;Mod assist;+2 safety/equipment Gait Distance (Feet): 200 Feet Assistive device: Rolling  walker (2 wheeled) Gait Pattern/deviations: Step-through pattern;Decreased step length - right;Decreased step length - left;Shuffle;Drifts right/left Gait velocity: slowed Gait velocity interpretation: 1.31 - 2.62 ft/sec, indicative of limited community ambulator General Gait Details: initially pt walked to bathroom without AD and modA for steadying in addition to reaching out for sink to steady himself, after going into the bathroom pt able to ambulate with minA progressing to [place] for steadying with RW, pt's decreased peripheral vision makes it difficult to navigate around obstacles in hallway, even with multimodal cues pt has difficulty with navigation. as ambulation progresses pt fatigues and knees begin to buckle requiring modA for returning to the recliner        Balance Overall balance assessment: Needs assistance Sitting-balance support: Feet supported Sitting balance-Leahy Scale: Fair       Standing balance-Leahy Scale: Poor                               Pertinent Vitals/Pain Pain Assessment: No/denies pain    Home Living Family/patient expects to be discharged to:: Private residence Living Arrangements: Non-relatives/Friends   Type of Home: House Home Access: Stairs to enter   Technical brewer of Steps: 1 Home Layout: One level Home Equipment: Tub bench;Hand held shower head;Grab bars - tub/shower      Prior Function Level of Independence: Independent         Comments: Pt independent in all ADL, IADL, and mobility tasks. Pt does not ambulate with an AD and reports 0 falls in the  last 6 months. Pt does not drive. Pt used to work as a Dealer.      Hand Dominance   Dominant Hand: Right    Extremity/Trunk Assessment   Upper Extremity Assessment Upper Extremity Assessment: Defer to OT evaluation    Lower Extremity Assessment Lower Extremity Assessment: Generalized weakness       Communication   Communication: No difficulties   Cognition Arousal/Alertness: Awake/alert Behavior During Therapy: WFL for tasks assessed/performed Overall Cognitive Status: No family/caregiver present to determine baseline cognitive functioning                                 General Comments: Pt pleasant and willing to participate in therapy. Cues to attend to tasks. Cues for safety.       General Comments General comments (skin integrity, edema, etc.): VSS on RA        Assessment/Plan    PT Assessment Patient needs continued PT services  PT Problem List Decreased strength;Decreased activity tolerance;Decreased balance;Decreased mobility;Decreased cognition;Decreased coordination;Decreased safety awareness;Decreased knowledge of use of DME       PT Treatment Interventions DME instruction;Gait training;Stair training;Functional mobility training;Therapeutic activities;Therapeutic exercise;Balance training;Cognitive remediation;Patient/family education    PT Goals (Current goals can be found in the Care Plan section)  Acute Rehab PT Goals Patient Stated Goal: Pt agreeable to working with therapy PT Goal Formulation: With patient Time For Goal Achievement: 04/14/19 Potential to Achieve Goals: Good    Frequency Min 3X/week        Co-evaluation PT/OT/SLP Co-Evaluation/Treatment: Yes Reason for Co-Treatment: For patient/therapist safety PT goals addressed during session: Mobility/safety with mobility OT goals addressed during session: ADL's and self-care;Strengthening/ROM       AM-PAC PT "6 Clicks" Mobility  Outcome Measure Help needed turning from your back to your side while in a flat bed without using bedrails?: None Help needed moving from lying on your back to sitting on the side of a flat bed without using bedrails?: A Little Help needed moving to and from a bed to a chair (including a wheelchair)?: A Little Help needed standing up from a chair using your arms (e.g., wheelchair or bedside chair)?: A  Little Help needed to walk in hospital room?: A Lot Help needed climbing 3-5 steps with a railing? : A Lot 6 Click Score: 17    End of Session Equipment Utilized During Treatment: Gait belt Activity Tolerance: Patient tolerated treatment well;Patient limited by fatigue Patient left: in chair;with call bell/phone within reach;with chair alarm set Nurse Communication: Mobility status;Other (comment)(IV site drainage) PT Visit Diagnosis: Unsteadiness on feet (R26.81);Other abnormalities of gait and mobility (R26.89);Muscle weakness (generalized) (M62.81);Difficulty in walking, not elsewhere classified (R26.2)    Time: [id number] PT Time Calculation (min) (ACUTE ONLY): 33 min   Charges:   PT Evaluation $PT Eval Moderate Complexity: 1 Mod          Asencion Guisinger B. Migdalia Dk PT, DPT Acute Rehabilitation Services Pager [phone] Office [phone]   [place] 03/31/2019, 2:40 PM

## 2019-04-01 DIAGNOSIS — D649 Anemia, unspecified: Secondary | ICD-10-CM

## 2019-04-01 LAB — CBC
HCT: 21.3 % — ABNORMAL LOW (ref 39.0–52.0)
HCT: 21.8 % — ABNORMAL LOW (ref 39.0–52.0)
Hemoglobin: 6.8 g/dL — CL (ref 13.0–17.0)
Hemoglobin: 7.1 g/dL — ABNORMAL LOW (ref 13.0–17.0)
MCH: 29.2 pg (ref 26.0–34.0)
MCH: 29.8 pg (ref 26.0–34.0)
MCHC: 31.9 g/dL (ref 30.0–36.0)
MCHC: 32.6 g/dL (ref 30.0–36.0)
MCV: 91.4 fL (ref 80.0–100.0)
MCV: 91.6 fL (ref 80.0–100.0)
Platelets: 183 10*3/uL (ref 150–400)
Platelets: 172 10*3/uL (ref 150–400)
RBC: 2.33 MIL/uL — ABNORMAL LOW (ref 4.22–5.81)
RBC: 2.38 MIL/uL — ABNORMAL LOW (ref 4.22–5.81)
RDW: 16.4 % — ABNORMAL HIGH (ref 11.5–15.5)
RDW: 15.4 % (ref 11.5–15.5)
WBC: 6.9 10*3/uL (ref 4.0–10.5)
WBC: 9 10*3/uL (ref 4.0–10.5)
nRBC: 0 % (ref 0.0–0.2)
nRBC: 0 % (ref 0.0–0.2)

## 2019-04-01 LAB — COMPREHENSIVE METABOLIC PANEL
ALT: 25 U/L (ref 0–44)
AST: 39 U/L (ref 15–41)
Albumin: 2 g/dL — ABNORMAL LOW (ref 3.5–5.0)
Alkaline Phosphatase: 58 U/L (ref 38–126)
Anion gap: 8 (ref 5–15)
BUN: 16 mg/dL (ref 8–23)
CO2: 17 mmol/L — ABNORMAL LOW (ref 22–32)
Calcium: 7.8 mg/dL — ABNORMAL LOW (ref 8.9–10.3)
Chloride: 112 mmol/L — ABNORMAL HIGH (ref 98–111)
Creatinine, Ser: 1 mg/dL (ref 0.61–1.24)
GFR calc Af Amer: >60 mL/min (ref >60)
GFR calc non Af Amer: >60 mL/min (ref >60)
Glucose, Bld: 90 mg/dL (ref 70–99)
Potassium: 3.6 mmol/L (ref 3.5–5.1)
Sodium: 137 mmol/L (ref 135–145)
Total Bilirubin: 1 mg/dL (ref 0.3–1.2)
Total Protein: 4.5 g/dL — ABNORMAL LOW (ref 6.5–8.1)

## 2019-04-01 LAB — PROTIME-INR
INR: 1.3 — ABNORMAL HIGH (ref 0.8–1.2)
Prothrombin Time: 16 seconds — ABNORMAL HIGH (ref 11.4–15.2)

## 2019-04-01 LAB — PREPARE RBC (CROSSMATCH)

## 2019-04-01 MED ORDER — SODIUM CHLORIDE 0.9 % IV SOLN: INTRAVENOUS | Status: DC

## 2019-04-01 MED ORDER — BOOST / RESOURCE BREEZE PO LIQD CUSTOM
1.0000 | Freq: Three times a day (TID) | ORAL | Status: DC
  Administered 2019-04-02 – 2019-04-06 (×11): 1 via ORAL

## 2019-04-01 MED ORDER — SODIUM CHLORIDE 0.9% IV SOLUTION: Freq: Once | INTRAVENOUS | Status: AC

## 2019-04-01 MED ORDER — PRO-STAT SUGAR FREE PO LIQD
30.0000 mL | Freq: Three times a day (TID) | ORAL | Status: DC
  Administered 2019-04-01 – 2019-04-06 (×14): 30 mL via ORAL
  Filled 2019-04-01 (×13): qty 30

## 2019-04-01 NOTE — Progress Notes (Signed)
Computer, glasses, wallet and phone with chargers delivered to the room and given to patient.

## 2019-04-01 NOTE — H&P (View-Only) (Signed)
[facility] Gastroenterology Progress Note  DELROY HUNTON 64 y.o. [birth year]   Subjective: Patient does not have any complaints today. He denies nausea, vomiting, and abdominal pain. He reports one melenic bowel movement today.  Objective: Vital signs: Vitals:   04/01/19 1314 04/01/19 1504  BP: (!) 113/57 (!) 95/58  Pulse: 85   Resp: 14   Temp: 97.9 F (36.6 C)   SpO2: 99%     Physical Exam: Gen: Lethargic, thin, no acute distress  HEENT: anicteric sclera CV: RRR Chest: CTA B Abd: Soft, nontender, nondistended. Large ventral hernia present. Normoactive bowel sounds. Ext: no edema Neuro: Oriented to person, place, time  Lab Results: Recent Labs    03/31/19 0505 04/01/19 0600  NA 145 137  K 3.3* 3.6  CL 123* 112*  CO2 16* 17*  GLUCOSE 96 90  BUN 26* 16  CREATININE 1.04 1.00  CALCIUM 7.9* 7.8*   Recent Labs    03/31/19 0505 04/01/19 0600  AST 39 39  ALT 25 25  ALKPHOS 53 58  BILITOT 1.8* 1.0  PROT 4.6* 4.5*  ALBUMIN 2.0* 2.0*   Recent Labs    03/30/19 0046 03/30/19 0501 03/31/19 0505 04/01/19 0600  WBC 11.2*   < > 7.5 6.9  NEUTROABS 8.5*  --   --   --   HGB 7.2*   < > 8.0* 6.8*  HCT 23.5*   < > 24.3* 21.3*  MCV 98.3   < > 90.3 91.4  PLT 291   < > 205 183   < > = values in this interval not displayed.      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 Continue oral lactulose 3 times daily. Continue to monitor H&H, transfuse as needed to maintain Hgb >7. Plan for EGD tomorrow to further evaluate melenic stools and persistently decreasing hemoglobin. If EGD unrevealing, then may need a capsule endoscopy vs repeat colonoscopy.  [name] 04/01/2019, 3:25 PM  Questions please call [phone]

## 2019-04-01 NOTE — Progress Notes (Signed)
[facility] Gastroenterology Progress Note  Gavin Arroyo 64 y.o. 1955-10-21   Subjective: Patient does not have any complaints today. He denies nausea, vomiting, and abdominal pain. He reports one melenic bowel movement today.  Objective: Vital signs: Vitals:   04/01/19 1314 04/01/19 1504  BP: (!) 113/57 (!) 95/58  Pulse: 85   Resp: 14   Temp: 97.9 F (36.6 C)   SpO2: 99%     Physical Exam: Gen: Lethargic, thin, no acute distress  HEENT: anicteric sclera CV: RRR Chest: CTA B Abd: Soft, nontender, nondistended. Large ventral hernia present. Normoactive bowel sounds. Ext: no edema Neuro: Oriented to person, place, time  Lab Results: Recent Labs    03/31/19 0505 04/01/19 0600  NA 145 137  K 3.3* 3.6  CL 123* 112*  CO2 16* 17*  GLUCOSE 96 90  BUN 26* 16  CREATININE 1.04 1.00  CALCIUM 7.9* 7.8*   Recent Labs    03/31/19 0505 04/01/19 0600  AST 39 39  ALT 25 25  ALKPHOS 53 58  BILITOT 1.8* 1.0  PROT 4.6* 4.5*  ALBUMIN 2.0* 2.0*   Recent Labs    03/30/19 0046 03/30/19 0501 03/31/19 0505 04/01/19 0600  WBC 11.2*   < > 7.5 6.9  NEUTROABS 8.5*  --   --   --   HGB 7.2*   < > 8.0* 6.8*  HCT 23.5*   < > 24.3* 21.3*  MCV 98.3   < > 90.3 91.4  PLT 291   < > 205 183   < > = values in this interval not displayed.      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 Continue oral lactulose 3 times daily. Continue to monitor H&H, transfuse as needed to maintain Hgb >7. Plan for EGD tomorrow to further evaluate melenic stools and persistently decreasing hemoglobin. If EGD unrevealing, then may need a capsule endoscopy vs repeat colonoscopy.  [name] 04/01/2019, 3:25 PM  Questions please call [phone]

## 2019-04-01 NOTE — Progress Notes (Addendum)
Patient ID: Gavin Arroyo, male   DOB: Jul 10, 1955, 64 y.o.   MRN: [id number]   Subjective:  HD: 2  Overnight: MAP was in the low 60s throughout most of the night and dropped to 59 at 4 AM, it has since increased back to 62 and will hold spironolactone and lasix, He had only one bowel movement overnight.   [name] was seen on rounds this morning and was resting comfortably. He awoke easily with light touch.  He denies any chest pain, dizziness, orthostatic symptoms, shortness of breath, and abdominal pain. He did mention continued melanotic loose stool 1x overnight.  We discussed the plan to communicate with [facility] about a possibly transfer. He agreed to this plan and did not have any questions or concerns at this time.   Objective:  Vital signs in last 24 hours: Vitals:   04/01/19 0020 04/01/19 0400 04/01/19 0659 04/01/19 0831  BP: 116/62 (!) 106/42 (!) 106/57   Pulse: 86 78 78   Resp: 13 16 12    Temp: 97.9 F (36.6 C) 97.8 F (36.6 C)  97.8 F (36.6 C)  TempSrc: Oral Oral    SpO2: 100% 100% 100%   Weight:  61.6 kg     Weight change: -0.2 kg  Intake/Output Summary (Last 24 hours) at 04/01/2019 1033 Last data filed at 04/01/2019 0830 Gross per 24 hour  Intake 1344.3 ml  Output 1750 ml  Net -405.7 ml   Physical Exam Cardiovascular:     Rate and Rhythm: Normal rate and regular rhythm.     Pulses: Normal pulses.     Heart sounds: Normal heart sounds.  Pulmonary:     Effort: Pulmonary effort is normal.     Breath sounds: Normal breath sounds.  Abdominal:     General: Bowel sounds are normal. There is no distension.     Palpations: Abdomen is soft. There is no fluid wave.     Hernia: A hernia is present. Hernia is present in the ventral area.  Musculoskeletal:     Right lower leg: No edema.     Left lower leg: No edema.  Skin:    General: Skin is warm and dry.  Neurological:     Mental Status: He is easily aroused. Mental status is at baseline.    CBC Latest Ref Rng &  Units 04/01/2019 03/31/2019 03/30/2019  WBC 4.0 - 10.5 K/uL 6.9 7.5 8.9  Hemoglobin 13.0 - 17.0 g/dL 6.8(LL) 8.0(L) 8.6(L)  Hematocrit 39.0 - 52.0 % 21.3(L) 24.3(L) 26.4(L)  Platelets 150 - 400 K/uL 183 205 212   CMP Latest Ref Rng & Units 04/01/2019 03/31/2019 03/30/2019  Glucose 70 - 99 mg/dL 90 96 112(H)  BUN 8 - 23 mg/dL 16 26(H) 29(H)  Creatinine 0.61 - 1.24 mg/dL 1.00 1.04 1.07  Sodium 135 - 145 mmol/L 137 145 144  Potassium 3.5 - 5.1 mmol/L 3.6 3.3(L) 3.8  Chloride 98 - 111 mmol/L 112(H) 123(H) 121(H)  CO2 22 - 32 mmol/L 17(L) 16(L) 16(L)  Calcium 8.9 - 10.3 mg/dL 7.8(L) 7.9(L) 8.0(L)  Total Protein 6.5 - 8.1 g/dL 4.5(L) 4.6(L) 4.6(L)  Total Bilirubin 0.3 - 1.2 mg/dL 1.0 1.8(H) 1.5(H)  Alkaline Phos 38 - 126 U/L 58 53 48  AST 15 - 41 U/L 39 39 34  ALT 0 - 44 U/L 25 25 22    INR: 03/24  1.3 03/23  1.4  03/20  1.3  Assessment/Plan:  Principal Problem:   Acute GI bleeding Active Problems:   Symptomatic  anemia   Hepatic cirrhosis (HCC)   Hepatic encephalopathy ([place])  Summary: Gavin Hanthorn is 64 year old male with a past medical history of cirrhosis, GI bleed, and ventral hernia who presented with acute encephalopathy and admitted for decompensated cirrhosis and persistent GI bleed on hospital day 2.   GI Bleed, symptomatic anemia: Patient had only one loose melanotic bowel movement overnight. His hemoglobin dropped to 6.8, down from 8.0 yesterday. Appreciate GIs assistance with this difficult case. [facility] has been contacted about a possible transfer; currently waiting for return call.  - continue protonix - received 1U pRBC today; f/u with H&H - trend CBC    Decompensated cirrhosis: Encephalopathy appears to have resolved, and patient is not showing any signs or symptoms of ascites at this time.  - continue spironolactone and lasix  -  high protein diet - continue lactulose 20 g 3 times a day PO   Diet: normal diet with high-protein supplementation. IVF: None,None VTE:  SCDs, no lovenox due to GI bleeding Code: Full PT/OT recs: Home health 3x/wk OT; rolling walker home health 3x/wk PT TOC recs: none     Dispo: Anticipated discharge pending further evaluation and management.      LOS: 2 days   Mikael Spray, Medical Student 04/01/2019, 10:33 AM

## 2019-04-01 NOTE — Progress Notes (Addendum)
CRITICAL VALUE STICKER  CRITICAL VALUE: Hemoglobin 6.8  RECEIVER (on-site recipient of call): Mathis Dad, RN  [place] NOTIFIED: 3/24 @ 0654  MD NOTIFIED:  Jerilynn Mages. Sharlet [place], phone call  [place]: [phone]  RESPONSE: Transfusion orders

## 2019-04-01 NOTE — Progress Notes (Signed)
Initial Nutrition Assessment  DOCUMENTATION CODES:   Non-severe (moderate) malnutrition in context of chronic illness  INTERVENTION:  Provide Boost Breeze po TID, each supplement provides 250 kcal and 9 grams of protein.  Provide 30 ml Prostat po TID, each supplement provides 100 kcal and 15 grams of protein.   Encourage adequate PO intake.   NUTRITION DIAGNOSIS:   Moderate Malnutrition related to chronic illness(cirrhosis) as evidenced by moderate muscle depletion, moderate fat depletion.  GOAL:   Patient will meet greater than or equal to 90% of their needs  MONITOR:   PO intake, Supplement acceptance, Skin, Diet advancement, Weight trends, Labs, I & O's  REASON FOR ASSESSMENT:   Consult Assessment of nutrition requirement/status  ASSESSMENT:   64 year old male with a past medical history of cirrhosis, GI bleed, and ventral hernia who presented with acute encephalopathy and admitted for decompensated cirrhosis and persistent GI bleed  Meal completion has been 50-100%. Pt reports appetite is fine currently and PTA. Pt unable to quantify usual intake at meals at home. RD to order nutritional supplements to aid in caloric and protein needs. Possible transfer to [facility] regarding GI bleed and further evaluation. Pt encouraged to eat his food at meals and to drink his supplements. Labs and medications reviewed.   NUTRITION - FOCUSED PHYSICAL EXAM:    Most Recent Value  Orbital Region  Moderate depletion  Upper Arm Region  Moderate depletion  Thoracic and Lumbar Region  Moderate depletion  Buccal Region  Moderate depletion  Temple Region  Moderate depletion  Clavicle Bone Region  Moderate depletion  Clavicle and Acromion Bone Region  Moderate depletion  Scapular Bone Region  Moderate depletion  Dorsal Hand  Unable to assess  Patellar Region  Moderate depletion  Anterior Thigh Region  Moderate depletion  Posterior Calf Region  Moderate depletion  Edema (RD Assessment)  Mild   Hair  Reviewed  Eyes  Reviewed  Mouth  Reviewed  Skin  Reviewed  Nails  Reviewed       Diet Order:   Diet Order            Diet NPO time specified  Diet effective midnight        Diet clear liquid Room service appropriate? Yes; Fluid consistency: Thin  Diet effective now              EDUCATION NEEDS:   Not appropriate for education at this time  Skin:  Skin Assessment: Reviewed RN Assessment  Last BM:  3/23  Height:   Ht Readings from Last 1 Encounters:  03/20/19 5\' 10"  (1.778 m)    Weight:   Wt Readings from Last 1 Encounters:  04/01/19 61.6 kg    Ideal Body Weight:  75.45 kg  BMI:  Body mass index is 19.49 kg/m.  Estimated Nutritional Needs:   Kcal:  2200-2400  Protein:  115-125 grams  Fluid:  >/= 2 L/day    Gavin Parker, MS, RD, LDN RD pager number/after hours weekend pager number on Amion.

## 2019-04-02 ENCOUNTER — Encounter (HOSPITAL_COMMUNITY)
Admission: EM | Disposition: A | Discharge status: Other Acute Inpatient Hospital | Payer: Self-pay | Source: Other Acute Inpatient Hospital | Attending: Student in an Organized Health Care Education/Training Program

## 2019-04-02 ENCOUNTER — Inpatient Hospital Stay (HOSPITAL_COMMUNITY): Payer: Self-pay | Admitting: Certified Registered Nurse Anesthetist

## 2019-04-02 DIAGNOSIS — I8510 Secondary esophageal varices without bleeding: Secondary | ICD-10-CM

## 2019-04-02 HISTORY — PX: ESOPHAGOGASTRODUODENOSCOPY (EGD) WITH PROPOFOL: SHX5813

## 2019-04-02 LAB — COMPREHENSIVE METABOLIC PANEL
ALT: 25 U/L (ref 0–44)
AST: 34 U/L (ref 15–41)
Albumin: 1.8 g/dL — ABNORMAL LOW (ref 3.5–5.0)
Alkaline Phosphatase: 62 U/L (ref 38–126)
Anion gap: 10 (ref 5–15)
BUN: 22 mg/dL (ref 8–23)
CO2: 17 mmol/L — ABNORMAL LOW (ref 22–32)
Calcium: 8.1 mg/dL — ABNORMAL LOW (ref 8.9–10.3)
Chloride: 109 mmol/L (ref 98–111)
Creatinine, Ser: 1.03 mg/dL (ref 0.61–1.24)
GFR calc Af Amer: >60 mL/min (ref >60)
GFR calc non Af Amer: >60 mL/min (ref >60)
Glucose, Bld: 104 mg/dL — ABNORMAL HIGH (ref 70–99)
Potassium: 4.2 mmol/L (ref 3.5–5.1)
Sodium: 136 mmol/L (ref 135–145)
Total Bilirubin: 1 mg/dL (ref 0.3–1.2)
Total Protein: 4.4 g/dL — ABNORMAL LOW (ref 6.5–8.1)

## 2019-04-02 LAB — CBC
HCT: 20.4 % — ABNORMAL LOW (ref 39.0–52.0)
HCT: 22.6 % — ABNORMAL LOW (ref 39.0–52.0)
Hemoglobin: 6.6 g/dL — CL (ref 13.0–17.0)
Hemoglobin: 7.3 g/dL — ABNORMAL LOW (ref 13.0–17.0)
MCH: 29.7 pg (ref 26.0–34.0)
MCH: 28.1 pg (ref 26.0–34.0)
MCHC: 32.4 g/dL (ref 30.0–36.0)
MCHC: 32.3 g/dL (ref 30.0–36.0)
MCV: 91.9 fL (ref 80.0–100.0)
MCV: 86.9 fL (ref 80.0–100.0)
Platelets: 208 10*3/uL (ref 150–400)
Platelets: 194 10*3/uL (ref 150–400)
RBC: 2.22 MIL/uL — ABNORMAL LOW (ref 4.22–5.81)
RBC: 2.6 MIL/uL — ABNORMAL LOW (ref 4.22–5.81)
RDW: 15.9 % — ABNORMAL HIGH (ref 11.5–15.5)
RDW: 18 % — ABNORMAL HIGH (ref 11.5–15.5)
WBC: 8 10*3/uL (ref 4.0–10.5)
WBC: 8.4 10*3/uL (ref 4.0–10.5)
nRBC: 0 % (ref 0.0–0.2)
nRBC: 0 % (ref 0.0–0.2)

## 2019-04-02 LAB — PREPARE RBC (CROSSMATCH)

## 2019-04-02 LAB — PROTIME-INR
INR: 1.3 — ABNORMAL HIGH (ref 0.8–1.2)
Prothrombin Time: 16.4 seconds — ABNORMAL HIGH (ref 11.4–15.2)

## 2019-04-02 LAB — MAGNESIUM: Magnesium: 1.9 mg/dL (ref 1.7–2.4)

## 2019-04-02 SURGERY — ESOPHAGOGASTRODUODENOSCOPY (EGD) WITH PROPOFOL
Anesthesia: Monitor Anesthesia Care

## 2019-04-02 MED ORDER — PROPOFOL 500 MG/50ML IV EMUL
INTRAVENOUS | Status: DC | PRN
  Administered 2019-04-02: 150 ug/kg/min via INTRAVENOUS

## 2019-04-02 MED ORDER — PHENYLEPHRINE 40 MCG/ML (10ML) SYRINGE FOR IV PUSH (FOR BLOOD PRESSURE SUPPORT)
PREFILLED_SYRINGE | INTRAVENOUS | Status: DC | PRN
  Administered 2019-04-02: 160 ug via INTRAVENOUS
  Administered 2019-04-02 (×2): 120 ug via INTRAVENOUS

## 2019-04-02 MED ORDER — LIDOCAINE 2% (20 MG/ML) 5 ML SYRINGE
INTRAMUSCULAR | Status: DC | PRN
  Administered 2019-04-02: 80 mg via INTRAVENOUS

## 2019-04-02 MED ORDER — LACTULOSE 10 GM/15ML PO SOLN
30.0000 g | Freq: Three times a day (TID) | ORAL | Status: DC
  Administered 2019-04-02 – 2019-04-03 (×3): 30 g via ORAL
  Filled 2019-04-02 (×3): qty 45

## 2019-04-02 MED ORDER — CIPROFLOXACIN IN D5W 400 MG/200ML IV SOLN
400.0000 mg | Freq: Two times a day (BID) | INTRAVENOUS | Status: DC
  Administered 2019-04-02 – 2019-04-03 (×2): 400 mg via INTRAVENOUS
  Filled 2019-04-02 (×2): qty 200

## 2019-04-02 MED ORDER — SODIUM CHLORIDE 0.9 % IV SOLN
25.0000 ug/h | INTRAVENOUS | Status: DC
  Administered 2019-04-02 – 2019-04-10 (×12): 25 ug/h via INTRAVENOUS
  Filled 2019-04-02 (×10): qty 1

## 2019-04-02 MED ORDER — SODIUM CHLORIDE 0.9% IV SOLUTION: Freq: Once | INTRAVENOUS | Status: AC

## 2019-04-02 MED ORDER — EPHEDRINE SULFATE-NACL 50-0.9 MG/10ML-% IV SOSY
PREFILLED_SYRINGE | INTRAVENOUS | Status: DC | PRN
  Administered 2019-04-02 (×2): 10 mg via INTRAVENOUS

## 2019-04-02 MED ORDER — LACTATED RINGERS IV SOLN: INTRAVENOUS | Status: DC | PRN

## 2019-04-02 SURGICAL SUPPLY — 15 items

## 2019-04-02 NOTE — Discharge Summary (Deleted)
  The note originally documented on this encounter has been moved the the encounter in which it belongs.  

## 2019-04-02 NOTE — Brief Op Note (Signed)
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 Small esophageal varices without bleeding stigmata. IV Abx started and IV Octreotide. Clear liquid diet today and if stable ok to advance to low sodium diet. No plans for additional endoscopic procedures at this time. Will f/u.

## 2019-04-02 NOTE — Progress Notes (Signed)
CRITICAL VALUE STICKER  CRITICAL VALUE: Hemoglobin 6.6  RECEIVER (on-site recipient of call): Mathis Dad, RN  [place] NOTIFIED: 3/25 @ 0710  MD NOTIFIED: Marisa Cyphers  TIME OF NOTIFICATION: 0712  RESPONSE: Order for Physician/Practicioner Attestation (Blood Administration)

## 2019-04-02 NOTE — Progress Notes (Signed)
Physical Therapy Treatment Patient Details Name: Gavin Arroyo MRN: [id number] DOB: 1955-02-19 Today's Date: 04/02/2019    History of Present Illness  64 y/o male, with a PMH of GI bleed, ventral hernia, and compensated liver cirrhosis, who presents to [facility] from [place] for a GI bleed. Per [facility], patient presented to [facility] he was found to have a hemoglobin of 5.8 with an elevated ammonia level of 87.    PT Comments    Pt asleep on entry, receiving blood transfusion. Pt rouses easily but initially refused to get up to recliner. Able to convince him to sit EoB. Pt requires min guard for coming EoB and once there requests to transfer to recliner. Pt requires hands on min guard for power up and steadying and minA for steadying at his hips with slow shuffling steps to the recliner. Once there, pt closes eyes and falls asleep. Anticipate once pt hemoglobin increases he will better be able to participate and will be appropriate for d/c home with HHPT. PT will continue to follow acutely.   Follow Up Recommendations  Home health PT;Supervision/Assistance - 24 hour     Equipment Recommendations  Rolling walker with 5" wheels;3in1 (PT)    Recommendations for Other Services       Precautions / Restrictions Precautions Precautions: Fall Restrictions Weight Bearing Restrictions: No    Mobility  Bed Mobility Overal bed mobility: Needs Assistance Bed Mobility: Supine to Sit     Supine to sit: HOB elevated;Min guard     General bed mobility comments: pt able to bring himself to EoB, requires multimodal cues to reach for footboard to pull hips to EoB  Transfers Overall transfer level: Needs assistance   Transfers: Sit to/from Stand Sit to Stand: Min guard         General transfer comment: hands on min guard for power up and steadying in standing  Ambulation/Gait Ambulation/Gait assistance: Min assist Gait Distance (Feet): 3 Feet Assistive device: None Gait Pattern/deviations:  Step-through pattern;Decreased step length - right;Decreased step length - left;Shuffle;Drifts right/left Gait velocity: slowed Gait velocity interpretation: <1.31 ft/sec, indicative of household ambulator General Gait Details: min A for steadying at the waist with slow, shuffling steps to the recliner         Balance Overall balance assessment: Needs assistance Sitting-balance support: Feet supported Sitting balance-Leahy Scale: Fair       Standing balance-Leahy Scale: Poor                              Cognition Arousal/Alertness: Lethargic Behavior During Therapy: Flat affect Overall Cognitive Status: No family/caregiver present to determine baseline cognitive functioning                                 General Comments: pt receiving blood and lethargic on entry, and flat throughout session          General Comments General comments (skin integrity, edema, etc.): BP soft during session, pt receiving blood, SaO2 on RA 100%O2      Pertinent Vitals/Pain Pain Assessment: No/denies pain           PT Goals (current goals can now be found in the care plan section) Acute Rehab PT Goals Patient Stated Goal: Pt agreeable to working with therapy PT Goal Formulation: With patient Time For Goal Achievement: 04/14/19 Potential to Achieve Goals: Good Progress towards PT goals: Not progressing  toward goals - comment(hemoglobin was 6.6 and pt receiving blood during session )    Frequency    Min 3X/week      PT Plan Current plan remains appropriate       AM-PAC PT "6 Clicks" Mobility   Outcome Measure  Help needed turning from your back to your side while in a flat bed without using bedrails?: None Help needed moving from lying on your back to sitting on the side of a flat bed without using bedrails?: A Little Help needed moving to and from a bed to a chair (including a wheelchair)?: A Little Help needed standing up from a chair using your  arms (e.g., wheelchair or bedside chair)?: A Little Help needed to walk in hospital room?: A Lot Help needed climbing 3-5 steps with a railing? : A Lot 6 Click Score: 17    End of Session Equipment Utilized During Treatment: Gait belt Activity Tolerance: Patient tolerated treatment well;Patient limited by fatigue;Patient limited by lethargy Patient left: in chair;with call bell/phone within reach;with chair alarm set Nurse Communication: Mobility status PT Visit Diagnosis: Unsteadiness on feet (R26.81);Other abnormalities of gait and mobility (R26.89);Muscle weakness (generalized) (M62.81);Difficulty in walking, not elsewhere classified (R26.2)     Time: [id number] PT Time Calculation (min) (ACUTE ONLY): 22 min  Charges:  $Therapeutic Activity: 8-22 mins                     Jaquisha Frech B. Migdalia Dk PT, DPT Acute Rehabilitation Services Pager [phone] Office [phone]    [place] 04/02/2019, 10:32 AM

## 2019-04-02 NOTE — Progress Notes (Signed)
Recorded patient's blood pressure every 30 minutes since IMTS page at 0300.  Pressures have remained relatively the same since.  Patient is still easily awakened, alert and oriented x4.  Asymptomatic and all other VSS.  He slept for most of the early morning.  He denies feeling any worse from yesterday.  The patient had one bowel movement last night that was dark red/black and loose.  He has been NPO since midnight for anticipated EGD today.

## 2019-04-02 NOTE — Progress Notes (Addendum)
Patient ID: Gavin Arroyo, male   DOB: 06-04-1955, 64 y.o.   MRN: [id number]   Subjective: HD: 3  Overnight: Low blood pressures in 80-100s/30-50s over night with low MAPs in the upper 50s-60s. He remained asymptomatic throughout.   Gavin Arroyo was seen on rounds this morning and reports feeling more tired today than yesterday, but is otherwise feeling fine. He has had one bowel movement since yesterday that he reports as being bloody and loose. He denies any dizziness, shortness of breath, orthostatic symptoms, and abdominal pain. We talked about the plan for endoscopy today and he was agreeable without any questions or concerns.   Objective:  Vital signs in last 24 hours: Vitals:   04/02/19 0830 04/02/19 1130 04/02/19 1137 04/02/19 1254  BP: (!) 84/54 (!) 97/38 103/68 (!) 103/53  Pulse: 79 84 84 75  Resp: (!) 29 14 14 13   Temp: (!) 97.5 F (36.4 C)  97.7 F (36.5 C)   TempSrc: Oral  Oral   SpO2: 99% 100% 100% 100%  Weight:       Weight change: 1.7 kg  Intake/Output Summary (Last 24 hours) at 04/02/2019 1318 Last data filed at 04/02/2019 1250 Gross per 24 hour  Intake 551.14 ml  Output 1250 ml  Net -698.86 ml   Physical Exam Constitutional:      Comments: Tired looking.   Cardiovascular:     Rate and Rhythm: Normal rate and regular rhythm.     Pulses: Normal pulses.     Heart sounds: Normal heart sounds.  Pulmonary:     Effort: Pulmonary effort is normal.     Breath sounds: Normal breath sounds.  Abdominal:     General: Bowel sounds are normal. There is no distension.     Palpations: Abdomen is soft. There is no fluid wave.     Tenderness: There is no abdominal tenderness.     Hernia: A hernia is present.  Skin:    General: Skin is warm and dry.     Coloration: Skin is pale. Skin is not jaundiced.  Neurological:     Mental Status: He is easily aroused. Mental status is at baseline.    CBC Latest Ref Rng & Units 04/02/2019 04/01/2019 04/01/2019  WBC 4.0 - 10.5 K/uL 8.0  9.0 6.9  Hemoglobin 13.0 - 17.0 g/dL 6.6(LL) 7.1(L) 6.8(LL)  Hematocrit 39.0 - 52.0 % 20.4(L) 21.8(L) 21.3(L)  Platelets 150 - 400 K/uL 208 172 183   CMP Latest Ref Rng & Units 04/02/2019 04/01/2019 03/31/2019  Glucose 70 - 99 mg/dL 104(H) 90 96  BUN 8 - 23 mg/dL 22 16 26(H)  Creatinine 0.61 - 1.24 mg/dL 1.03 1.00 1.04  Sodium 135 - 145 mmol/L 136 137 145  Potassium 3.5 - 5.1 mmol/L 4.2 3.6 3.3(L)  Chloride 98 - 111 mmol/L 109 112(H) 123(H)  CO2 22 - 32 mmol/L 17(L) 17(L) 16(L)  Calcium 8.9 - 10.3 mg/dL 8.1(L) 7.8(L) 7.9(L)  Total Protein 6.5 - 8.1 g/dL 4.4(L) 4.5(L) 4.6(L)  Total Bilirubin 0.3 - 1.2 mg/dL 1.0 1.0 1.8(H)  Alkaline Phos 38 - 126 U/L 62 58 53  AST 15 - 41 U/L 34 39 39  ALT 0 - 44 U/L 25 25 25    INR: 03/25 1.3 03/24 1.3 03/23 1.4  Mg:  03/25 1.9  EGD: - small esophageal varices - Hematin in gastric fundus  - portal HTN gastropathy - mucosal nodule in duodenum    Assessment/Plan:  Principal Problem:   Acute GI bleeding Active Problems:  Symptomatic anemia   Hepatic cirrhosis (HCC)   Hepatic encephalopathy ([place])  Summary:  Gavin Arroyo is a 64 year old male with a past medical history of cirrhosis and large ventral hernia admitted for ongoing GI bleed and hepatic encephalopathy on hospital day 3.   Acute GI bleeding, symptomatic anemia:  Over night the patient was hypotensive and had low MAPs; will hold spironolactone and lasix for now. Hgb dropped this morning again to 6.6 and required another transfusion this morning. EGD was performed this afternoon and revealed hematin in the gastric fundus, portal HTN gastropathy, and small esophageal varices that were not actively bleeding. Source of blood could be slow ooze from portal HTN gastropathy. Will start octreotide and monitor for clinical improvement. - trend CBC - transfuse if Hgb <7.0; f/u post transfusion H&H  - GI following; appreciate all recommendations - received 1 U pRBC today  - start octreotide -  start ciprofloxacin 400 mg q12h  Decompensated cirrhosis:  Encephalopathy appear to have resolved and the patient continues to show no signs or symptoms of ascites at this time. Will monitor closely for mental status changes and ascites.  - Hold spironolactone and lasix for now due to low blood pressures and MAPs - high protein diet - continue lactulose 20 g 3 times a day PO, goal 3 loose BM/day  Diet: Clear liquids IVF: None VTE: SCDs, no lovenox due to GI bleeding Code: Full PT/OT recs: Home health 3x/wk OT; rolling walker home health 3x/wk PT TOC recs: none   Dispo: Anticipated discharge pending further evaluation and management.    LOS: 3 days   Mikael Spray, Medical Student 04/02/2019, 1:18 PM

## 2019-04-02 NOTE — Interval H&P Note (Signed)
History and Physical Interval Note:  04/02/2019 1:17 PM  Gavin Arroyo  has presented today for surgery, with the diagnosis of GI bleed.  The various methods of treatment have been discussed with the patient and family. After consideration of risks, benefits and other options for treatment, the patient has consented to  Procedure(s): ESOPHAGOGASTRODUODENOSCOPY (EGD) WITH PROPOFOL (N/A) as a surgical intervention.  The patient's history has been reviewed, patient examined, no change in status, stable for surgery.  I have reviewed the patient's chart and labs.  Questions were answered to the patient's satisfaction.     [name]

## 2019-04-02 NOTE — Op Note (Signed)
[facility] Patient Name: Gavin Arroyo Procedure Date : 04/02/2019 MRN: [id number] Attending MD: [name] , MD Date of Birth: [birth year] CSN: [id number] Age: 64 Admit Type: Inpatient Procedure:                Upper GI endoscopy Indications:              Active gastrointestinal bleeding, Melena, Cirrhosis Providers:                [name], MD, Grace Isaac, RN, Lazaro Arms, Technician Referring MD:             hospital team Medicines:                Propofol per Anesthesia, Monitored Anesthesia Care Complications:            No immediate complications. Estimated Blood Loss:     Estimated blood loss: none. Procedure:                Pre-Anesthesia Assessment:                           - Prior to the procedure, a History and Physical                            was performed, and patient medications and                            allergies were reviewed. The patient's tolerance of                            previous anesthesia was also reviewed. The risks                            and benefits of the procedure and the sedation                            options and risks were discussed with the patient.                            All questions were answered, and informed consent                            was obtained. Prior Anticoagulants: The patient has                            taken no previous anticoagulant or antiplatelet                            agents. ASA Grade Assessment: III - A patient with                            severe systemic disease. After reviewing the risks  and benefits, the patient was deemed in                            satisfactory condition to undergo the procedure.                           After obtaining informed consent, the endoscope was                            passed under direct vision. Throughout the                            procedure, the patient's blood  pressure, pulse, and                            oxygen saturations were monitored continuously. The                            [id number] Olympus gastroscope was                            introduced through the mouth, and advanced to the                            second part of duodenum. The upper GI endoscopy was                            accomplished without difficulty. The patient                            tolerated the procedure well. Scope In: Scope Out: Findings:      Small (< 5 mm) varices were found in the mid esophagus and in the distal       esophagus.      The Z-line was regular and was found 40 cm from the incisors.      Hematin (altered blood/coffee-ground-like material) was found in the       gastric fundus.      Moderate portal hypertensive gastropathy was found in the gastric fundus       and in the gastric body.      The cardia and gastric fundus were normal on retroflexion.      A few medium mucosal nodules with a patchy distribution were found in       the duodenal bulb. Impression:               - Small (< 5 mm) esophageal varices.                           - Z-line regular, 40 cm from the incisors.                           - Hematin (altered blood/coffee-ground-like                            material) in the gastric fundus.                           -  Portal hypertensive gastropathy.                           - Mucosal nodule found in the duodenum.                           - No specimens collected. Recommendation:           - Clear liquid diet.                           - Observe patient's clinical course. Procedure Code(s):        --- Professional ---                           [phone], Esophagogastroduodenoscopy, flexible,                            transoral; diagnostic, including collection of                            specimen(s) by brushing or washing, when performed                            (separate procedure) Diagnosis Code(s):        ---  Professional ---                           K92.1, Melena (includes Hematochezia)                           K76.6, Portal hypertension                           K92.2, Gastrointestinal hemorrhage, unspecified                           I85.00, Esophageal varices without bleeding                           K31.89, Other diseases of stomach and duodenum CPT copyright 2019 [employer]. All rights reserved. The codes documented in this report are preliminary and upon coder review may  be revised to meet current compliance requirements. [name], MD 04/02/2019 1:47:29 PM This report has been signed electronically. Number of Addenda: 0

## 2019-04-02 NOTE — Transfer of Care (Signed)
Immediate Anesthesia Transfer of Care Note  Patient: Gavin Arroyo  Procedure(s) Performed: ESOPHAGOGASTRODUODENOSCOPY (EGD) WITH PROPOFOL (N/A )  Patient Location: Endoscopy Unit  Anesthesia Type:MAC  Level of Consciousness: drowsy  Airway & Oxygen Therapy: Patient Spontanous Breathing and Patient connected to nasal cannula oxygen  Post-op Assessment: Report given to RN and Post -op Vital signs reviewed and stable  Post vital signs: Reviewed and stable  Last Vitals:  Vitals Value Taken Time  BP 93/45 04/02/19 1348  Temp 36.6 C 04/02/19 1345  Pulse 76 04/02/19 1349  Resp 22 04/02/19 1349  SpO2 100 % 04/02/19 1349  Vitals shown include unvalidated device data.  Last Pain:  Vitals:   04/02/19 1345  TempSrc: Oral  PainSc: 0-No pain         Complications: No apparent anesthesia complications

## 2019-04-02 NOTE — Anesthesia Preprocedure Evaluation (Signed)
Anesthesia Evaluation  Patient identified by MRN, date of birth, ID band Patient awake    Reviewed: Allergy & Precautions, H&P , NPO status , Patient's Chart, lab work & pertinent test results  Airway Mallampati: II  TM Distance: >3 FB Neck ROM: Full    Dental no notable dental hx. (+) Poor Dentition, Dental Advisory Given   Pulmonary Current Smoker,    Pulmonary exam normal breath sounds clear to auscultation       Cardiovascular negative cardio ROS   Rhythm:Regular Rate:Normal     Neuro/Psych negative neurological ROS  negative psych ROS   GI/Hepatic negative GI ROS, (+)     substance abuse  alcohol use, GI Bleed   Endo/Other  negative endocrine ROS  Renal/GU negative Renal ROS  negative genitourinary   Musculoskeletal   Abdominal   Peds  Hematology  (+) Blood dyscrasia, anemia ,   Anesthesia Other Findings   Reproductive/Obstetrics negative OB ROS                             Anesthesia Physical  Anesthesia Plan  ASA: III  Anesthesia Plan: MAC   Post-op Pain Management:    Induction: Intravenous  PONV Risk Score and Plan: 1 and Propofol infusion  Airway Management Planned: Nasal Cannula  Additional Equipment:   Intra-op Plan:   Post-operative Plan:   Informed Consent: I have reviewed the patients History and Physical, chart, labs and discussed the procedure including the risks, benefits and alternatives for the proposed anesthesia with the patient or authorized representative who has indicated his/her understanding and acceptance.     Dental advisory given  Plan Discussed with: CRNA  Anesthesia Plan Comments:         Anesthesia Quick Evaluation

## 2019-04-02 NOTE — Discharge Summary (Addendum)
Name: Gavin Arroyo MRN: [id number] DOB: 1955/03/27 64 y.o. PCP: Patient, No Pcp Per  Date of Admission: 03/14/2019 Date of Discharge: 03/18/2019 Attending Physician: Velna Ochs, MD  Discharge Diagnosis: 1. GI bleed  Discharge Medications: Current Meds  Medication Sig  . furosemide (LASIX) 40 MG tablet Take 1 tablet (40 mg total) by mouth daily.  . pantoprazole (PROTONIX) 40 MG tablet Take 1 tablet (40 mg total) by mouth daily.  Marland Kitchen spironolactone (ALDACTONE) 100 MG tablet Take 1 tablet (100 mg total) by mouth daily.    Disposition and follow-up:   Gavin Arroyo was discharged from [facility] in Stable condition.  At the hospital follow up visit please address:  1.  F/u: GI bleed: needs repeat labs 2) Cirrhosis - needs NS beta blocker and diuretic therapy  2.  Labs / imaging needed at time of follow-up: CBC, CMP  3.  Pending labs/ test needing follow-up: none  Follow-up Appointments:   Hospital Course by problem list: 1. GI Bleed Patient presented to [facility] ED with sign and symptoms concerning for acute GI bleed. He presented with hematochezia and a Hgb of 5.0. GI was consulted and performed an EGD/colonoscopy which showed small non bleeding esophageal varices and non-bleeding diverticula. There was no evidence of decompensated cirrhosis. Patient bleeding was managed with blood transfusions and IVF with stabilization of his Hgb. Considering there was no obvious source of bleeding identified on endoscopy, I wanted to get a capsule endoscopy to further evaluate the patients GI bleed. GI recommended discharge with follow up at their clinic in the OP setting.   2. Cirrhosis - Patient was diagnosed with compensated cirrhosis without signs of ascites or encephalopathy. Etiology 2/2 to ETOH use disorder. He was started on spironolactone and lasix prior to discharge at 100/40 ratio. Patient was discharged with follow up with GI.   Discharge Vitals:   BP (!) 84/54    Pulse 79   Temp (!) 97.5 F (36.4 C) (Oral)   Resp (!) 29   Wt 63.3 kg   SpO2 99%   BMI 20.02 kg/m   Pertinent Labs, Studies, and Procedures:  WBC4.0 - 10.5 K/uL5.6 5.7 6.5 7.1 RBC4.22 - 5.81 MIL/uL2.24Low  1.89Low  2.34Low  2.18Low  Hemoglobin13.0 - 17.0 g/dL7.1Low  8.2Low  CM 6.2Low Panic  CM 7.6Low  7.0Low  HCT39.0 - 52.0 %21.8Low  24.9Low  CM 18.6Low  23.1Low  21.2Low  MCV80.0 - 100.0 fL97.3 98.4 98.7 97.2 MCH26.0 - 34.0 pg31.7 32.8 32.5 32.1 MCHC30.0 - 36.0 g/dL32.6 33.3 32.9 33.0 RDW11.5 - 15.5 %19.2High  17.9High  17.5High  16.6High  Platelets150 - 400 K/uL115Low  103Low  CM 123Low  81Low    Sodium135 - 145 mmol/L139 Potassium3.5 - 5.1 mmol/L3.8 Chloride98 - 111 mmol/L111 CO222 - 32 mmol/L22 Glucose, Bld70 - 99 mg/dL123High  Comment: Glucose reference range applies only to samples taken after fasting for at least 8 hours. BUN8 - 23 [id number] Creatinine, Ser0.61 - 1.24 mg/dL0.94 Calcium8.9 - 10.3 mg/dL7.3Low  Total Protein6.5 - 8.1 g/dL3.7Low  Albumin3.5 - 5.0 g/dL1.8Low  AST15 - 41 U/L75High  ALT0 - 44 U/L31 Alkaline Phosphatase38 - 126 U/L43 Total Bilirubin0.3 - 1.2 mg/dL1.1 GFR calc non Af Amer>60 mL/min>60 GFR calc Af Amer>60 mL/min>60 Anion gap5 - 156   CT Abdomen with contrast:  1. Adjacent ventral hernias. The more cephalad hernia contains transverse colon, fat and mesentery. More caudal ventral hernia contains mesenteric vessels and fat. No evidence for bowel obstruction or focal bowel inflammation. 2.  Abnormal liver. Liver is diffusely heterogeneous even on the delayed images. In addition, there is mild nodularity of the liver raising concern for underlying cirrhosis. Heterogeneity may also be related to diffuse fatty infiltration. There is a indeterminate 9 mm lesion in the anterior left hepatic lobe. Recommend follow-up MRI of the liver, with and without contrast, to evaluate this hepatic lesion and evaluate for underlying liver disease. 3. Mild mesenteric edema throughout  the abdomen and pelvis is nonspecific. No ascites. 4. Small esophageal varices and possible cirrhosis. 5. Aortic Atherosclerosis (ICD10-I70.0).   Discharge Instructions:  You were hospitalized for GI bleed. Thank you for allowing [place] to be part of your care.   We arranged for you to follow up at: 1. Please follow up with GI. There clinic will call you to set up a follow up appointment 2. The social workers gave you a list of PCP to call for a hospital follow up appointment. Please follow up within 1 week.    Please note these changes made to your medications:   Please START taking:  1. Spironolactone 100 mg 2. Furosemide 40 mg  Please STOP taking:    Please make sure to Make hospital follow up appointments.   Please call our clinic if you have any questions or concerns, we may be able to help and keep you from a long and expensive emergency room wait. Our clinic and after hours phone number is [phone], the best time to call is Monday through Friday 9 am to 4 pm but there is always someone available 24/7 if you have an emergency. If you need medication refills please notify your pharmacy one week in advance and they will send [place] a request.     Signed: Marianna Payment, MD 04/02/2019, 9:37 AM   Pager: [phone]

## 2019-04-02 NOTE — Progress Notes (Signed)
OT Cancellation Note  Patient Details Name: Gavin Arroyo MRN: [id number] DOB: 11/29/1955   Cancelled Treatment:    Reason Eval/Treat Not Completed: Patient at procedure or test/ unavailable(endoscopy)  Malka So 04/02/2019, 1:57 PM  Nestor Lewandowsky, OTR/L Acute Rehabilitation Services Pager: [phone] Office: [phone]

## 2019-04-02 NOTE — Progress Notes (Signed)
Pharmacy Antibiotic Note  Gavin Arroyo is a 64 y.o. male admitted on 03/30/2019 with acute GI bleeding.  S/p upper endoscopy 04/02/19.  Pharmacy has been consulted for Ciprofloxacin dosing for GI bleed in the setting of cirrhosis.   Plan: Cipro 400 mg IV q12h  Monitor clinical status, renal function and culture results daily.    Weight: 139 lb 8.8 oz (63.3 kg)  Temp (24hrs), Avg:98.1 F (36.7 C), Min:97.5 F (36.4 C), Max:98.9 F (37.2 C)  Recent Labs  Lab 03/30/19 0046 03/30/19 0046 03/30/19 1041 03/30/19 1041 03/31/19 0505 04/01/19 0600 04/01/19 1653 04/02/19 0600 04/02/19 1443  WBC 11.2*   < > 8.9   < > 7.5 6.9 9.0 8.0 8.4  CREATININE 1.12  --  1.07  --  1.04 1.00  --  1.03  --    < > = values in this interval not displayed.    Estimated Creatinine Clearance: 67.4 mL/min (by C-G formula based on SCr of 1.03 mg/dL).    No Known Allergies  Antimicrobials this admission: Cipro 3/25>>  Dose adjustments this admission:   Microbiology results: 3/23 MRSA PCR : neg 3/22 BCx: ngtd  3/22 COVID: neg Thank you for allowing pharmacy to be a part of this patient's care.  Nicole Cella, RPh Clinical Pharmacist Please check AMION for all [employer] phone numbers After 10:00 PM, call [employer] [phone] 04/02/2019 3:30 PM

## 2019-04-02 NOTE — Progress Notes (Signed)
Paged IMTS intern Gilford Rile @[phone]  about patient BP 98/50 (64).  The patient's MAP has been 52-70 all night.  The patient has been asymptomatic and all other VSS.  [place] advised nurse to continue to trend the patient's blood pressure.  Monitor currently set to retake pressure every 30 min.  The patient's blood pressure comes up slightly when he is woken up.  Patient is currently sleeping, BP 86/44 (56).  Charge nurse also made aware.

## 2019-04-03 ENCOUNTER — Other Ambulatory Visit: Payer: Self-pay

## 2019-04-03 ENCOUNTER — Encounter (HOSPITAL_COMMUNITY): Payer: Self-pay | Admitting: Student in an Organized Health Care Education/Training Program

## 2019-04-03 DIAGNOSIS — K766 Portal hypertension: Secondary | ICD-10-CM

## 2019-04-03 DIAGNOSIS — K3189 Other diseases of stomach and duodenum: Secondary | ICD-10-CM

## 2019-04-03 LAB — CBC
HCT: 21.3 % — ABNORMAL LOW (ref 39.0–52.0)
Hemoglobin: 6.9 g/dL — CL (ref 13.0–17.0)
MCH: 28.3 pg (ref 26.0–34.0)
MCHC: 32.4 g/dL (ref 30.0–36.0)
MCV: 87.3 fL (ref 80.0–100.0)
Platelets: 200 10*3/uL (ref 150–400)
RBC: 2.44 MIL/uL — ABNORMAL LOW (ref 4.22–5.81)
RDW: 18.8 % — ABNORMAL HIGH (ref 11.5–15.5)
WBC: 6.6 10*3/uL (ref 4.0–10.5)
nRBC: 0 % (ref 0.0–0.2)

## 2019-04-03 LAB — PREPARE RBC (CROSSMATCH)

## 2019-04-03 LAB — TYPE AND SCREEN
ABO/RH(D): A POS
Antibody Screen: NEGATIVE
Unit division: 0
Unit division: 0
Unit division: 0
Unit division: 0

## 2019-04-03 LAB — COMPREHENSIVE METABOLIC PANEL
ALT: 26 U/L (ref 0–44)
AST: 35 U/L (ref 15–41)
Albumin: 1.9 g/dL — ABNORMAL LOW (ref 3.5–5.0)
Alkaline Phosphatase: 58 U/L (ref 38–126)
Anion gap: 10 (ref 5–15)
BUN: 17 mg/dL (ref 8–23)
CO2: 15 mmol/L — ABNORMAL LOW (ref 22–32)
Calcium: 8.2 mg/dL — ABNORMAL LOW (ref 8.9–10.3)
Chloride: 113 mmol/L — ABNORMAL HIGH (ref 98–111)
Creatinine, Ser: 0.94 mg/dL (ref 0.61–1.24)
GFR calc Af Amer: >60 mL/min (ref >60)
GFR calc non Af Amer: >60 mL/min (ref >60)
Glucose, Bld: 129 mg/dL — ABNORMAL HIGH (ref 70–99)
Potassium: 4.1 mmol/L (ref 3.5–5.1)
Sodium: 138 mmol/L (ref 135–145)
Total Bilirubin: 1.1 mg/dL (ref 0.3–1.2)
Total Protein: 4.4 g/dL — ABNORMAL LOW (ref 6.5–8.1)

## 2019-04-03 LAB — BPAM RBC
Blood Product Expiration Date: 202104182359
Blood Product Expiration Date: 202104192359
Blood Product Expiration Date: 202104192359
Blood Product Expiration Date: 202104212359
ISSUE DATE / TIME: 202103220200
ISSUE DATE / TIME: 202103220551
ISSUE DATE / TIME: 202103241148
ISSUE DATE / TIME: 202103250803
Unit Type and Rh: 6200
Unit Type and Rh: 6200
Unit Type and Rh: 6200
Unit Type and Rh: 6200

## 2019-04-03 LAB — PROTIME-INR
INR: 1.3 — ABNORMAL HIGH (ref 0.8–1.2)
Prothrombin Time: 16.4 seconds — ABNORMAL HIGH (ref 11.4–15.2)

## 2019-04-03 LAB — HEMOGLOBIN AND HEMATOCRIT, BLOOD
HCT: 24.7 % — ABNORMAL LOW (ref 39.0–52.0)
Hemoglobin: 8 g/dL — ABNORMAL LOW (ref 13.0–17.0)

## 2019-04-03 MED ORDER — SODIUM CHLORIDE 0.9% IV SOLUTION: Freq: Once | INTRAVENOUS | Status: AC

## 2019-04-03 MED ORDER — LACTULOSE 10 GM/15ML PO SOLN
30.0000 g | Freq: Four times a day (QID) | ORAL | Status: DC
  Administered 2019-04-03 – 2019-04-04 (×7): 30 g via ORAL
  Filled 2019-04-03 (×7): qty 45

## 2019-04-03 NOTE — Anesthesia Postprocedure Evaluation (Signed)
Anesthesia Post Note  Patient: Gavin Arroyo  Procedure(s) Performed: ESOPHAGOGASTRODUODENOSCOPY (EGD) WITH PROPOFOL (N/A )     Patient location during evaluation: PACU Anesthesia Type: MAC Level of consciousness: awake and alert Pain management: pain level controlled Vital Signs Assessment: post-procedure vital signs reviewed and stable Respiratory status: spontaneous breathing, nonlabored ventilation, respiratory function stable and patient connected to nasal cannula oxygen Cardiovascular status: stable and blood pressure returned to baseline Postop Assessment: no apparent nausea or vomiting Anesthetic complications: no    Last Vitals:  Vitals:   04/03/19 0400 04/03/19 0800  BP: (!) 101/46 100/67  Pulse: 88 78  Resp: 18 14  Temp:  (!) 36.3 C  SpO2: 98% 100%    Last Pain:  Vitals:   04/03/19 0800  TempSrc: Oral  PainSc: 0-No pain                 Keslee Harrington

## 2019-04-03 NOTE — Progress Notes (Signed)
Physical Therapy Treatment Patient Details Name: Gavin Arroyo MRN: [id number] DOB: 1955-10-08 Today's Date: 04/03/2019    History of Present Illness 64 y/o male, with a PMH of GI bleed, ventral hernia, and compensated liver cirrhosis, who presents to [facility] from [place] for a GI bleed. Per [facility], patient presented to [facility] he was found to have a hemoglobin of 5.8 with an elevated ammonia level of 87.  Pt admitted with ongoing GI bleed and hepatic encephalopathy.    PT Comments    Pt demonstrating gradual progress; however, session somewhat limited due to low Hgb and pt getting PRBC.  Pt's VSS and he tolerated short distance ambulation and transfers.  Needed min A for steadying and cues for safety.  Continue to progress as able.     Follow Up Recommendations  Home health PT;Supervision/Assistance - 24 hour     Equipment Recommendations  Rolling walker with 5" wheels;3in1 (PT)    Recommendations for Other Services       Precautions / Restrictions Precautions Precautions: Fall Precaution Comments: low hgb    Mobility  Bed Mobility Overal bed mobility: Needs Assistance Bed Mobility: Supine to Sit     Supine to sit: HOB elevated;Min guard     General bed mobility comments: required cues for direction and assist for line/leads  Transfers Overall transfer level: Needs assistance Equipment used: 1 person hand held assist Transfers: Sit to/from Stand           General transfer comment: hands on min guard for power up and steadying in standing; performed sit to stand x 3  Ambulation/Gait Ambulation/Gait assistance: Min assist Gait Distance (Feet): 4 Feet Assistive device: 1 person hand held assist Gait Pattern/deviations: Step-through pattern;Shuffle;Decreased stride length;Staggering right;Staggering left Gait velocity: slowed   General Gait Details: Pt unsteady requiring min  A for balance; slow shuffle steps to recliner (further ambulation limited due to receiving  PRBC); additionally marched in place 30sec x 2   Stairs             Wheelchair Mobility    Modified Rankin (Stroke Patients Only)       Balance Overall balance assessment: Needs assistance Sitting-balance support: Feet supported Sitting balance-Leahy Scale: Fair     Standing balance support: No upper extremity supported;During functional activity Standing balance-Leahy Scale: Poor Standing balance comment: required min A for steadying                            Cognition Arousal/Alertness: Awake/alert Behavior During Therapy: Flat affect Overall Cognitive Status: Within Functional Limits for tasks assessed                                        Exercises      General Comments General comments (skin integrity, edema, etc.): Noted pt's hgb 6.9 and receiving PRBC, however, pt's hgb has been low since admission (down from 7.3 yesterday) and per RN ok to attempt PT.  Did perform limited ambulation due to low hgb and getting PRBC.  All VSS      Pertinent Vitals/Pain Pain Assessment: No/denies pain    Home Living                      Prior Function            PT Goals (current goals can now be found  in the care plan section) Progress towards PT goals: Progressing toward goals    Frequency    Min 3X/week      PT Plan Current plan remains appropriate    Co-evaluation              AM-PAC PT "6 Clicks" Mobility   Outcome Measure  Help needed turning from your back to your side while in a flat bed without using bedrails?: None Help needed moving from lying on your back to sitting on the side of a flat bed without using bedrails?: A Little Help needed moving to and from a bed to a chair (including a wheelchair)?: A Little Help needed standing up from a chair using your arms (e.g., wheelchair or bedside chair)?: A Little Help needed to walk in hospital room?: A Little Help needed climbing 3-5 steps with a railing?  : A Lot 6 Click Score: 18    End of Session Equipment Utilized During Treatment: Gait belt Activity Tolerance: Patient tolerated treatment well;Patient limited by fatigue;Patient limited by lethargy Patient left: in chair;with call bell/phone within reach;with chair alarm set Nurse Communication: Mobility status PT Visit Diagnosis: Unsteadiness on feet (R26.81);Other abnormalities of gait and mobility (R26.89);Muscle weakness (generalized) (M62.81);Difficulty in walking, not elsewhere classified (R26.2)     Time: [id number] PT Time Calculation (min) (ACUTE ONLY): 23 min  Charges:  $Gait Training: 8-22 mins $Therapeutic Activity: 8-22 mins                     Maggie Font, PT Acute Rehab Services Pager [phone] [facility] [phone] [facility] [phone]    Gavin Arroyo 04/03/2019, 4:31 PM

## 2019-04-03 NOTE — TOC Initial Note (Addendum)
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 Pt recently discharged for same dx.  PTA independent from home with girlfriend and her brother.  Pt informed CM that there is already a 3:1 in the home - belonged to his girlfriends family member.  Pt informed CM that his roommates will provide 24 hour supervision at discharge.  Pt is interested in [facility] as recommended.  Pt in agreement for CM to arrange a post discharge follow up app at [facility] - see AVS.    CM left VM for charity agency [facility] requesting call back for charity referral.  TOC will follow up                 Update:  [facility] will forward referral for branch review  Expected Discharge Plan: [place] Barriers to Discharge: Continued Medical Work up   Patient Goals and CMS Choice Patient states their goals for this hospitalization and ongoing recovery are:: Pt did not specify a goal CMS Medicare.gov Compare Post Acute Care list provided to:: Patient Choice offered to / list presented to : Patient  Expected Discharge Plan and Services Expected Discharge Plan: [place]     Post Acute Care Choice: Home Health                             HH Arranged: PT, OT [place] Agency: [facility]     Representative spoke with at [place]: Left VM for liason 04/03/19 at 2:50pm requesting call back  Prior Living Arrangements/Services   Lives with:: Roommate, Significant Other Patient language and need for interpreter reviewed:: Yes Do you feel safe going back to the place where you live?: Yes      Need for Family Participation in Patient Care: Yes (Comment) Care giver support system in place?: Yes  (comment) Current home services: DME(pt has a 3:1 already in the home) Criminal Activity/Legal Involvement Pertinent to Current Situation/Hospitalization: No - Comment as needed  Activities of Daily Living Home Assistive Devices/Equipment: Eyeglasses ADL Screening (condition at time of admission) Patient's cognitive ability adequate to safely complete daily activities?: Yes Is the patient deaf or have difficulty hearing?: No Does the patient have difficulty seeing, even when wearing glasses/contacts?: No Does the patient have difficulty concentrating, remembering, or making decisions?: No Patient able to express need for assistance with ADLs?: Yes Does the patient have difficulty dressing or bathing?: No Independently performs ADLs?: Yes (appropriate for developmental age) Does the patient have difficulty walking or climbing stairs?: No Weakness of Legs: None Weakness of Arms/Hands: None  Permission Sought/Granted   Permission granted to share information with : Yes, Verbal Permission Granted              Emotional Assessment   Attitude/Demeanor/Rapport: Engaged   Orientation: : Oriented to Self, Oriented to Place, Oriented to  Time, Oriented to Situation   Psych Involvement: No (comment)  Admission diagnosis:  Acute GI bleeding [K92.2] Upper gastrointestinal bleed [K92.2] Encounter for imaging study to confirm nasogastric (NG) tube placement [Z01.89] Other cirrhosis of liver ([place]) [K74.69] Encephalomyopathy [G93.9] Patient Active Problem List   Diagnosis Date Noted  . Hepatic encephalopathy ([place])  03/30/2019  . Malnutrition of moderate degree 03/28/2019  . Hepatic cirrhosis ([place]) 03/24/2019  . Acute GI bleeding 03/20/2019  . Acute blood loss anemia 03/19/2019  . Symptomatic anemia   . NAGMA 03/14/2019  . Alcohol use disorder, moderate, dependence ([place]) 03/14/2019   PCP:  Patient, No Pcp Per Pharmacy:   [id number] - [place], [place] [id number] [place] [id number] Phone: [phone] Fax: [phone]  [place], [place] [address] [place] [address] Phone: [phone] Fax: [phone]  [id number] - [place], [place] [address] [facility] [place] [address] Phone: [phone] Fax: [phone]     Social Determinants of Health (SDOH) Interventions    Readmission Risk Interventions No flowsheet data found.

## 2019-04-03 NOTE — Progress Notes (Signed)
[facility]astroenterology Progress Note  Gavin Arroyo 64 y.o. Jun 15, 1955 Patient with alcoholic cirrhosis and anemia.  He underwent endoscopy yesterday which showed moderate portal gastropathy with oozing and a small maroon-colored blood clot.  There were small esophageal varices without bleeding stigmata.  Subjective: Patient reports he is doing "all right" today.  He denies any abdominal pain, nausea, vomiting.  He last had a bowel movement yesterday.  Patient is unsure what color it was and this was not documented in the chart.  He has not had any bowel movements today.  He is oriented but speech is slowed and he appears distracted.  Objective: Vital signs: Vitals:   04/03/19 0400 04/03/19 0800  BP: (!) 101/46 100/67  Pulse: 88 78  Resp: 18 14  Temp:  (!) 97.4 F (36.3 C)  SpO2: 98% 100%    Physical Exam: Gen: alert, no acute distress  HEENT: anicteric sclera CV: RRR Chest: CTA B Abd: Soft, nontender, nondistended.  Large ventral hernia present.  Normoactive bowel sounds. Neuro: Awake and oriented to person, place, month/year (but not day).  Patient appears distracted and slower to respond today.  Patient unable to fully straighten arms to assess for asterixis. Ext: no edema  Lab Results: Recent Labs    04/02/19 0600 04/03/19 0556  NA 136 138  K 4.2 4.1  CL 109 113*  CO2 17* 15*  GLUCOSE 104* 129*  BUN 22 17  CREATININE 1.03 0.94  CALCIUM 8.1* 8.2*  MG 1.9  --    Recent Labs    04/02/19 0600 04/03/19 0556  AST 34 35  ALT 25 26  ALKPHOS 62 58  BILITOT 1.0 1.1  PROT 4.4* 4.4*  ALBUMIN 1.8* 1.9*   Recent Labs    04/02/19 1443 04/03/19 0556  WBC 8.4 6.6  HGB 7.3* 6.9*  HCT 22.6* 21.3*  MCV 86.9 87.3  PLT 194 200      Assessment/Plan: Patient with alcoholic cirrhosis presenting with anemia and melenic stools, likely related to portal gastropathy.  Hemoglobin slightly decreased from 6.9 from 7.3 yesterday.  Continue Protonix IV twice daily and continuous  IV octreotide.  Monitor H&H and transfuse as needed.  Hepatic encephalopathy, resolved.  Patient is mildly distracted but remains fairly oriented.  No overt encephalopathy. Patient remains hypoalbuminemic.  LFTs within normal limits. Continue lactulose TID.  We will continue to monitor clinically. No further endoscopic evaluation planned at this time.  Salley Slaughter 04/03/2019, 11:36 AM  Questions please call [phone]

## 2019-04-03 NOTE — Progress Notes (Signed)
   04/03/19 0800  OT Visit Information  Last OT Received On 04/03/19  Reason Eval/Treat Not Completed Other (comment) (Pt hemoglobin 6.9, outside of therapeutic parameters. Will re-attempt session as appropriate)  Restrictions  Weight Bearing Restrictions No

## 2019-04-03 NOTE — Progress Notes (Signed)
Patient ID: Gavin Arroyo, male   DOB: 1955-09-07, 64 y.o.   MRN: [id number]   Subjective: HD: 4  Overnight: low blood pressure over night in 80-100s/45-59; MAPs low but over 60 (65-80s)  Gavin Arroyo was seen this morning on rounds and was laying bed. He appeared more tired today, but did not feel like he was more tired when asked. He denies any bowel movements since yesterday, however when we arrived the nurse was cleaning up some stool. He denies any shortness of breath, abdominal pain, dizziness, or chest pain.  We discussed the results of his EGD yesterday and he had no questions or concerns at this time.    Objective:  Vital signs in last 24 hours: Vitals:   04/02/19 1600 04/02/19 2005 04/02/19 2336 04/03/19 0400  BP:  (!) 108/59 (!) 95/51 (!) 101/46  Pulse:  82 68 88  Resp:  11 (!) 9 18  Temp: 97.6 F (36.4 C) (!) 97 F (36.1 C)    TempSrc: Axillary Axillary    SpO2:  98% 98% 98%  Weight:       Weight change:   Intake/Output Summary (Last 24 hours) at 04/03/2019 0649 Last data filed at 04/03/2019 0530 Gross per 24 hour  Intake 1658.18 ml  Output 2300 ml  Net -641.82 ml   Physical Exam Cardiovascular:     Rate and Rhythm: Normal rate and regular rhythm.     Pulses: Normal pulses.     Heart sounds: Normal heart sounds.  Pulmonary:     Effort: Pulmonary effort is normal.     Breath sounds: Normal breath sounds.  Abdominal:     Tenderness: There is no abdominal tenderness.     Hernia: A hernia is present. Hernia is present in the ventral area.  Skin:    General: Skin is warm and dry.     Coloration: Skin is pale.     Findings: No bruising.  Neurological:     Mental Status: He is lethargic.    CBC Latest Ref Rng & Units 04/03/2019 04/02/2019 04/02/2019  WBC 4.0 - 10.5 K/uL 6.6 8.4 8.0  Hemoglobin 13.0 - 17.0 g/dL 6.9(LL) 7.3(L) 6.6(LL)  Hematocrit 39.0 - 52.0 % 21.3(L) 22.6(L) 20.4(L)  Platelets 150 - 400 K/uL 200 194 208   CMP Latest Ref Rng & Units 04/03/2019  04/02/2019 04/01/2019  Glucose 70 - 99 mg/dL 129(H) 104(H) 90  BUN 8 - 23 mg/dL 17 22 16   Creatinine 0.61 - 1.24 mg/dL 0.94 1.03 1.00  Sodium 135 - 145 mmol/L 138 136 137  Potassium 3.5 - 5.1 mmol/L 4.1 4.2 3.6  Chloride 98 - 111 mmol/L 113(H) 109 112(H)  CO2 22 - 32 mmol/L 15(L) 17(L) 17(L)  Calcium 8.9 - 10.3 mg/dL 8.2(L) 8.1(L) 7.8(L)  Total Protein 6.5 - 8.1 g/dL 4.4(L) 4.4(L) 4.5(L)  Total Bilirubin 0.3 - 1.2 mg/dL 1.1 1.0 1.0  Alkaline Phos 38 - 126 U/L 58 62 58  AST 15 - 41 U/L 35 34 39  ALT 0 - 44 U/L 26 25 25    INR:  03/26 1.3 03/25 1.3 03/24 1.3   Assessment/Plan:  Principal Problem:   Acute GI bleeding Active Problems:   Symptomatic anemia   Hepatic cirrhosis (HCC)   Hepatic encephalopathy (HCC)  Summary:  Gavin Arroyo is a 64 year old male with a past medical history of cirrhosis and large ventral hernia admitted for acute hepatic encephalopathy and ongoing GI bleed on hospital day 4.   Acute GI bleeding  and symptomatic anemia:  Continued to have low BP and MAP overnight, will continue to hold spironolactone and lasix. Hgb this morning was 6.9, down from 7.3 post transfusion yesterday.  Source of bleeding liekly to be from slow ooze from portal HTN gastropathy identified on EGD yesterday, especially given the presence of hematin in the stomach. Will monitor closely for clinical improvement.  - transfuse 1 U pRBC; f/u with H&H - trend daily CBC - started on octreotide infusion  - continue ciprofloxacin 400 mg Q12H - GI following; we appreciate all recommendations  Decompensated cirrhosis, acute encephaolpathy:  The patient presented with acute hepatic encephalopathy that resolved with administration of lactulose. However, he appears to be in stage 1 hepatic encephalopathy today given that there is a chang in his mental status and he now appears lethargic.   - hold spironolactone and lasix - continue lactulose 30g, increase to QID; goal 3-4 BM/day  - high protein  diet     Diet: Clear liquids [id number] ID:[id number], no lovenox due to GI bleeding Code:Full PT/OT recs:Home health 3x/wk OT; rolling walker home health 3x/wk PT TOC recs:none  Dispo: Anticipated dischargepending further evaluation and management.   LOS: 4 days   Mikael Spray, Medical Student 04/03/2019, 6:49 AM

## 2019-04-03 NOTE — Progress Notes (Signed)
Critical hemoglobin level of 6.9 notified to team.

## 2019-04-04 LAB — COMPREHENSIVE METABOLIC PANEL
ALT: 28 U/L (ref 0–44)
AST: 42 U/L — ABNORMAL HIGH (ref 15–41)
Albumin: 1.8 g/dL — ABNORMAL LOW (ref 3.5–5.0)
Alkaline Phosphatase: 56 U/L (ref 38–126)
Anion gap: 9 (ref 5–15)
BUN: 14 mg/dL (ref 8–23)
CO2: 16 mmol/L — ABNORMAL LOW (ref 22–32)
Calcium: 8.1 mg/dL — ABNORMAL LOW (ref 8.9–10.3)
Chloride: 111 mmol/L (ref 98–111)
Creatinine, Ser: 0.91 mg/dL (ref 0.61–1.24)
GFR calc Af Amer: >60 mL/min (ref >60)
GFR calc non Af Amer: >60 mL/min (ref >60)
Glucose, Bld: 115 mg/dL — ABNORMAL HIGH (ref 70–99)
Potassium: 4.1 mmol/L (ref 3.5–5.1)
Sodium: 136 mmol/L (ref 135–145)
Total Bilirubin: 0.7 mg/dL (ref 0.3–1.2)
Total Protein: 4.3 g/dL — ABNORMAL LOW (ref 6.5–8.1)

## 2019-04-04 LAB — CULTURE, BLOOD (ROUTINE X 2)
Culture: NO GROWTH
Culture: NO GROWTH
Special Requests: ADEQUATE

## 2019-04-04 LAB — CBC
HCT: 19.4 % — ABNORMAL LOW (ref 39.0–52.0)
HCT: 20.2 % — ABNORMAL LOW (ref 39.0–52.0)
Hemoglobin: 6.2 g/dL — CL (ref 13.0–17.0)
Hemoglobin: 6.7 g/dL — CL (ref 13.0–17.0)
MCH: 28.8 pg (ref 26.0–34.0)
MCH: 29.1 pg (ref 26.0–34.0)
MCHC: 32 g/dL (ref 30.0–36.0)
MCHC: 33.2 g/dL (ref 30.0–36.0)
MCV: 90.2 fL (ref 80.0–100.0)
MCV: 87.8 fL (ref 80.0–100.0)
Platelets: 227 10*3/uL (ref 150–400)
Platelets: 206 10*3/uL (ref 150–400)
RBC: 2.15 MIL/uL — ABNORMAL LOW (ref 4.22–5.81)
RBC: 2.3 MIL/uL — ABNORMAL LOW (ref 4.22–5.81)
RDW: 17.9 % — ABNORMAL HIGH (ref 11.5–15.5)
RDW: 16.7 % — ABNORMAL HIGH (ref 11.5–15.5)
WBC: 8.3 10*3/uL (ref 4.0–10.5)
WBC: 8.9 10*3/uL (ref 4.0–10.5)
nRBC: 0 % (ref 0.0–0.2)
nRBC: 0 % (ref 0.0–0.2)

## 2019-04-04 LAB — PREPARE RBC (CROSSMATCH)

## 2019-04-04 LAB — HEMOGLOBIN AND HEMATOCRIT, BLOOD
HCT: 21.5 % — ABNORMAL LOW (ref 39.0–52.0)
Hemoglobin: 7.1 g/dL — ABNORMAL LOW (ref 13.0–17.0)

## 2019-04-04 MED ORDER — SODIUM CHLORIDE 0.9% IV SOLUTION: Freq: Once | INTRAVENOUS | Status: AC

## 2019-04-04 MED ORDER — SODIUM CHLORIDE 0.9% IV SOLUTION
Freq: Once | INTRAVENOUS | Status: AC
  Administered 2019-04-04: 1 mL via INTRAVENOUS

## 2019-04-04 NOTE — Progress Notes (Signed)
Subjective: HD#5 Events Overnight: States that he had roughly 4 bowel movements overnight 2 of which were read in color and the other 2 were dark stools.  Today, Gavin Arroyo  states that he is feeling much better.  He feels like he has more energy.  He denies any abdominal pain.  He does admit to having stools overnight with a mix of red and dark material.  Otherwise he denies any new symptoms at this time including chest pain, shortness of breath, weakness.  Objective:  Vital signs in last 24 hours: Vitals:   04/03/19 1440 04/03/19 1702 04/03/19 2100 04/04/19 0609  BP: (!) 105/57 115/68 108/68 103/62  Pulse: 72 84 94 92  Resp: 10 17 18 14   Temp: (!) 97.4 F (36.3 C) 97.8 F (36.6 C) 98.9 F (37.2 C) 98.7 F (37.1 C)  TempSrc:  Oral Oral Oral  SpO2:  100% 100% 100%  Weight:      Height:       Supplemental O2: Room air   Physical Exam: Physical Exam  Constitutional: He is oriented to person, place, and time.  HENT:  Head: Normocephalic.  Eyes: EOM are normal.  Cardiovascular: Normal rate and intact distal pulses.  No murmur heard. Pulmonary/Chest: Effort normal. No respiratory distress.  Abdominal: Soft. He exhibits no distension. There is no abdominal tenderness.  Musculoskeletal:        General: No tenderness or edema. Normal range of motion.     Cervical back: Normal range of motion.  Neurological: He is alert and oriented to person, place, and time.  Skin: Skin is warm and dry. He is not diaphoretic.    Filed Weights   04/01/19 0400 04/02/19 0346 04/03/19 0900  Weight: 61.6 kg 63.3 kg 63 kg     Intake/Output Summary (Last 24 hours) at 04/04/2019 1123 Last data filed at 04/04/2019 0900 Gross per 24 hour  Intake 820.94 ml  Output 850 ml  Net -29.06 ml    Risk Score:  n/a  Pertinent labs/Imaging: CBC Latest Ref Rng & Units 04/04/2019 04/03/2019 04/03/2019  WBC 4.0 - 10.5 K/uL 8.3 - 6.6  Hemoglobin 13.0 - 17.0 g/dL 6.2(LL) 8.0(L) 6.9(LL)  Hematocrit 39.0  - 52.0 % 19.4(L) 24.7(L) 21.3(L)  Platelets 150 - 400 K/uL 227 - 200    CMP Latest Ref Rng & Units 04/04/2019 04/03/2019 04/02/2019  Glucose 70 - 99 mg/dL 115(H) 129(H) 104(H)  BUN 8 - 23 mg/dL 14 17 22   Creatinine 0.61 - 1.24 mg/dL 0.91 0.94 1.03  Sodium 135 - 145 mmol/L 136 138 136  Potassium 3.5 - 5.1 mmol/L 4.1 4.1 4.2  Chloride 98 - 111 mmol/L 111 113(H) 109  CO2 22 - 32 mmol/L 16(L) 15(L) 17(L)  Calcium 8.9 - 10.3 mg/dL 8.1(L) 8.2(L) 8.1(L)  Total Protein 6.5 - 8.1 g/dL 4.3(L) 4.4(L) 4.4(L)  Total Bilirubin 0.3 - 1.2 mg/dL 0.7 1.1 1.0  Alkaline Phos 38 - 126 U/L 56 58 62  AST 15 - 41 U/L 42(H) 35 34  ALT 0 - 44 U/L 28 26 25        Assessment/Plan:  Principal Problem:   Acute GI bleeding Active Problems:   Symptomatic anemia   Hepatic cirrhosis (HCC)   Hepatic encephalopathy (HCC)    Patient Summary: Gavin Arroyo is a 64 y.o. with pertinent PMH of cirrhosis and large ventral hernia who was admitted for acute hepatic encephalopathy and ongoing GI bleed  on hospital day 5  Acute GI bleeding and symptomatic  anemia:  Patient continues to have low hemoglobin levels.  His hemoglobin this morning was 6.2.  We will transfuse 1 unit PRBCs today.  With follow-up with posttransfusion H&H.  Considering patient's GI bleed is likely secondary to upper GI pathology in the setting of his decompensated cirrhosis, will continue octreotide and PPI. -We will continue daily CBCs to monitor hemoglobin -Continue octreotide and PPI -Appreciate GIs recommendations.  Decompensated cirrhosis, acute encephaolpathy:  Patient has bonded well to the lactulose therapy.  His encephalopathy appears to be much improved.  He states that he is having roughly 3 bowel movements overnight with a mix of red and black material.  Patient continues to have low blood pressures and therefore, we will continue to hold off on diuretic therapy.  We will monitor him closely for signs symptoms concerning for ascites.   -Continue lactulose 30 g 4 times daily.  Diet: Heart Healthy IVF: none VTE: SCDs Code: Full PT/OT recs: Home Health TOC recs: none   Dispo: Anticipated discharge pending clinical improvement.    Marianna Payment, D.O. [facility], PGY-1 Date 04/04/2019 Time 11:23 AM

## 2019-04-04 NOTE — Progress Notes (Signed)
CRITICAL VALUE ALERT  Critical Value:  Hgb 6.2  Date & Time Notied:  3/27 @ 1044  Provider Notified: MD paged  Orders Received/Actions taken: will await any new orders given

## 2019-04-04 NOTE — Progress Notes (Signed)
CRITICAL VALUE ALERT  Critical Value:  Hgb 6.7  Date & Time Notied:  3/27 @ 1610  Provider Notified: Dr Marianna Payment paged, aware  Orders Received/Actions taken: Per MD will repeat H&H d/t Pt's blood transfusion completed at 1500 and CBC checked <1 hr post complete.

## 2019-04-04 NOTE — Progress Notes (Signed)
Dr. Joette Catching EGD report was noted.  Also noted was a drop in hemoglobin overnight.  Agree with blood transfusion as needed.  Also agree with continued use of PPI and octreotide drip at this time.  No further intervention planned at this time.  Continue medical and supportive care.

## 2019-04-05 LAB — CBC
HCT: 17.3 % — ABNORMAL LOW (ref 39.0–52.0)
Hemoglobin: 5.5 g/dL — CL (ref 13.0–17.0)
MCH: 29.3 pg (ref 26.0–34.0)
MCHC: 31.8 g/dL (ref 30.0–36.0)
MCV: 92 fL (ref 80.0–100.0)
Platelets: 189 10*3/uL (ref 150–400)
RBC: 1.88 MIL/uL — ABNORMAL LOW (ref 4.22–5.81)
RDW: 16.6 % — ABNORMAL HIGH (ref 11.5–15.5)
WBC: 10.4 10*3/uL (ref 4.0–10.5)
nRBC: 0 % (ref 0.0–0.2)

## 2019-04-05 LAB — HEMOGLOBIN AND HEMATOCRIT, BLOOD
HCT: 23.5 % — ABNORMAL LOW (ref 39.0–52.0)
Hemoglobin: 7.5 g/dL — ABNORMAL LOW (ref 13.0–17.0)

## 2019-04-05 LAB — PREPARE RBC (CROSSMATCH)

## 2019-04-05 MED ORDER — LACTULOSE 10 GM/15ML PO SOLN
30.0000 g | Freq: Three times a day (TID) | ORAL | Status: DC
  Administered 2019-04-05 – 2019-04-17 (×33): 30 g via ORAL
  Filled 2019-04-05 (×35): qty 45

## 2019-04-05 MED ORDER — SODIUM CHLORIDE 0.9% IV SOLUTION: Freq: Once | INTRAVENOUS | Status: AC

## 2019-04-05 NOTE — Progress Notes (Signed)
CRITICAL VALUE ALERT  Critical Value:  Hbg 5.5  Date & Time Notied:  3/28 @ [id number]  Provider Notified: MD aware  Orders Received/Actions taken: will await orders for blood transfusion

## 2019-04-05 NOTE — Progress Notes (Signed)
   Subjective: HD#6   Overnight: Transfused 1 unit PRBC  Today, Gavin Arroyo states that he is feeling well this morning.  He had a total of 5 bowel movements yesterday.  Initially it was black/red and transition to brown/red.  I asked him what his understanding was regarding his frequent admissions and he told me "I do not know, I just keep bleeding.  "I was able to explain to him the previous endoscopic procedures we have done and our findings.  I made him aware that medically, we are trying our best to stabilize him and our hope is that he will spontaneously stop bleeding however, if bleeding continues to persist we will have to discuss next steps moving forward occluding conversation with palliative medicine.  He does understand that he is not a candidate for his large ventral hernia repair due to overt complications.  Objective:  Vital signs in last 24 hours: Vitals:   04/05/19 0200 04/05/19 0230 04/05/19 0300 04/05/19 0330  BP: 116/63 113/68 110/63 107/69  Pulse: 82 81 79 92  Resp: 18 14 10 18   Temp: 98.4 F (36.9 C) 98.4 F (36.9 C) 98.3 F (36.8 C)   TempSrc: Oral Oral Oral   SpO2: 100% 99% 100% 100%  Weight:      Height:       Const: In no apparent distress, lying comfortably in bed, conversational Abd: Large ventral hernia, abdomen nontender to palpation   Assessment/Plan:  Principal Problem:   Acute GI bleeding Active Problems:   Symptomatic anemia   Hepatic cirrhosis (HCC)   Hepatic encephalopathy (HCC)   Gavin Arroyo is a 64 y.o. with pertinent PMH of cirrhosis and large ventral hernia who was admitted for acute hepatic encephalopathy and ongoing GI bleed  on hospital day 6   Acute GI bleeding and symptomatic anemia: S/p 7U pRBC transfusion Hgb 7.5<<7.1<<6.7.  He continues to have slow GI bleed some melanotic and some bright red.  I had a conversation with him today and made him aware that we might be running out of options regarding his chronic slow GI bleed  and that we would revisit his condition daily. -Daily CBC -Continue octreotide and PPI -Transfuse with goal >7 -Appreciate GIs recommendations   Decompensated cirrhosis, acute encephaolpathy: No evidence of encephalopathy  -Continue lactulose 30 g 4 times daily.   FEN: Soft diet VTE ppx: SCDs CODE STATUS: Full code  Prior to Admission Living Arrangement: Home Anticipated Discharge Location: Home Barriers to Discharge: Blood transfusion, ongoing medical therapy Dispo: To be determined   Jean Rosenthal, MD 04/05/2019, 6:52 AM Pager: [phone] Internal Medicine Teaching Service

## 2019-04-05 NOTE — Progress Notes (Signed)
Physical Therapy Treatment Patient Details Name: Gavin Arroyo MRN: [id number] DOB: 1955-06-17 Today's Date: 04/05/2019    History of Present Illness  64 y/o male, with a PMH of GI bleed, ventral hernia, and compensated liver cirrhosis, who presents to [facility] from [place] for a GI bleed. Per [facility], patient presented to [facility] he was found to have a hemoglobin of 5.8 with an elevated ammonia level of 87.    PT Comments    Pt in bed working on computer on entry, agreeable to getting up with therapy to ambulate. Pt is limited in safe mobility by decreased safety awareness and decreased knowledge of DME in presence of generalized weakness and decreased balance. Pt is making good progress towards his goals and is currently min guard for bed mobility and transfers and hands on min guard for ambulation of 225 feet with RW. Pt anxious to get back to his room to place lunch order as physician has progressed his clear liquid diet. D/c plans remain appropriate. PT will continue to follow acutely.    Follow Up Recommendations  Home health PT;Supervision/Assistance - 24 hour     Equipment Recommendations  Rolling walker with 5" wheels;3in1 (PT)       Precautions / Restrictions Precautions Precautions: Fall Restrictions Weight Bearing Restrictions: No    Mobility  Bed Mobility Overal bed mobility: Needs Assistance Bed Mobility: Supine to Sit     Supine to sit: HOB elevated;Min guard     General bed mobility comments: pt able to bring himself to EoB without assist  Transfers Overall transfer level: Needs assistance   Transfers: Sit to/from Stand Sit to Stand: Min guard         General transfer comment: hands on min guard for power up and steadying in standing  Ambulation/Gait Ambulation/Gait assistance: Min guard Gait Distance (Feet): 225 Feet Assistive device: None;Rolling walker (2 wheeled) Gait Pattern/deviations: Step-through pattern;Decreased step length - right;Decreased step  length - left;Shuffle;Drifts right/left Gait velocity: slowed Gait velocity interpretation: 1.31 - 2.62 ft/sec, indicative of limited community ambulator General Gait Details: hands on min guard for safety and to keep pants from falling down while ambulating, maximal cuing for RW usage and navigation around obstacles in hallway.        Balance Overall balance assessment: Needs assistance Sitting-balance support: Feet supported Sitting balance-Leahy Scale: Fair       Standing balance-Leahy Scale: Fair Standing balance comment: able to stand without assist                            Cognition Arousal/Alertness: Lethargic Behavior During Therapy: Flat affect Overall Cognitive Status: No family/caregiver present to determine baseline cognitive functioning                                 General Comments: pt receiving blood and lethargic on entry, and flat throughout session          General Comments General comments (skin integrity, edema, etc.): VSS on RA      Pertinent Vitals/Pain Pain Assessment: No/denies pain           PT Goals (current goals can now be found in the care plan section) Acute Rehab PT Goals Patient Stated Goal: Pt agreeable to working with therapy PT Goal Formulation: With patient Time For Goal Achievement: 04/14/19 Potential to Achieve Goals: Good    Frequency    Min  3X/week      PT Plan Current plan remains appropriate       AM-PAC PT "6 Clicks" Mobility   Outcome Measure  Help needed turning from your back to your side while in a flat bed without using bedrails?: None Help needed moving from lying on your back to sitting on the side of a flat bed without using bedrails?: A Little Help needed moving to and from a bed to a chair (including a wheelchair)?: A Little Help needed standing up from a chair using your arms (e.g., wheelchair or bedside chair)?: A Little Help needed to walk in hospital room?: A  Lot Help needed climbing 3-5 steps with a railing? : A Lot 6 Click Score: 17    End of Session Equipment Utilized During Treatment: Gait belt Activity Tolerance: Patient tolerated treatment well Patient left: in chair;with call bell/phone within reach;with chair alarm set Nurse Communication: Mobility status PT Visit Diagnosis: Unsteadiness on feet (R26.81);Other abnormalities of gait and mobility (R26.89);Muscle weakness (generalized) (M62.81);Difficulty in walking, not elsewhere classified (R26.2)     Time: [id number] PT Time Calculation (min) (ACUTE ONLY): 11 min  Charges:  $Gait Training: 8-22 mins                     Christene Pounds B. Migdalia Dk PT, DPT Acute Rehabilitation Services Pager [phone] Office [phone]    [place] 04/05/2019, 1:35 PM

## 2019-04-06 LAB — COMPREHENSIVE METABOLIC PANEL
ALT: 28 U/L (ref 0–44)
AST: 39 U/L (ref 15–41)
Albumin: 1.7 g/dL — ABNORMAL LOW (ref 3.5–5.0)
Alkaline Phosphatase: 60 U/L (ref 38–126)
Anion gap: 8 (ref 5–15)
BUN: 11 mg/dL (ref 8–23)
CO2: 17 mmol/L — ABNORMAL LOW (ref 22–32)
Calcium: 7.6 mg/dL — ABNORMAL LOW (ref 8.9–10.3)
Chloride: 108 mmol/L (ref 98–111)
Creatinine, Ser: 0.83 mg/dL (ref 0.61–1.24)
GFR calc Af Amer: >60 mL/min (ref >60)
GFR calc non Af Amer: >60 mL/min (ref >60)
Glucose, Bld: 112 mg/dL — ABNORMAL HIGH (ref 70–99)
Potassium: 4 mmol/L (ref 3.5–5.1)
Sodium: 133 mmol/L — ABNORMAL LOW (ref 135–145)
Total Bilirubin: 1 mg/dL (ref 0.3–1.2)
Total Protein: 3.9 g/dL — ABNORMAL LOW (ref 6.5–8.1)

## 2019-04-06 LAB — CBC
HCT: 21.5 % — ABNORMAL LOW (ref 39.0–52.0)
HCT: 18.6 % — ABNORMAL LOW (ref 39.0–52.0)
HCT: 23 % — ABNORMAL LOW (ref 39.0–52.0)
Hemoglobin: 7.1 g/dL — ABNORMAL LOW (ref 13.0–17.0)
Hemoglobin: 6 g/dL — CL (ref 13.0–17.0)
Hemoglobin: 7.6 g/dL — ABNORMAL LOW (ref 13.0–17.0)
MCH: 29.6 pg (ref 26.0–34.0)
MCH: 29.3 pg (ref 26.0–34.0)
MCH: 29.8 pg (ref 26.0–34.0)
MCHC: 33 g/dL (ref 30.0–36.0)
MCHC: 32.3 g/dL (ref 30.0–36.0)
MCHC: 33 g/dL (ref 30.0–36.0)
MCV: 89.6 fL (ref 80.0–100.0)
MCV: 90.7 fL (ref 80.0–100.0)
MCV: 90.2 fL (ref 80.0–100.0)
Platelets: 196 10*3/uL (ref 150–400)
Platelets: 198 10*3/uL (ref 150–400)
Platelets: 212 10*3/uL (ref 150–400)
RBC: 2.4 MIL/uL — ABNORMAL LOW (ref 4.22–5.81)
RBC: 2.05 MIL/uL — ABNORMAL LOW (ref 4.22–5.81)
RBC: 2.55 MIL/uL — ABNORMAL LOW (ref 4.22–5.81)
RDW: 16.1 % — ABNORMAL HIGH (ref 11.5–15.5)
RDW: 17.3 % — ABNORMAL HIGH (ref 11.5–15.5)
RDW: 16.6 % — ABNORMAL HIGH (ref 11.5–15.5)
WBC: 10.6 10*3/uL — ABNORMAL HIGH (ref 4.0–10.5)
WBC: 10.8 10*3/uL — ABNORMAL HIGH (ref 4.0–10.5)
WBC: 11 10*3/uL — ABNORMAL HIGH (ref 4.0–10.5)
nRBC: 0 % (ref 0.0–0.2)
nRBC: 0 % (ref 0.0–0.2)
nRBC: 0 % (ref 0.0–0.2)

## 2019-04-06 LAB — PROTIME-INR
INR: 1.4 — ABNORMAL HIGH (ref 0.8–1.2)
Prothrombin Time: 17.2 seconds — ABNORMAL HIGH (ref 11.4–15.2)

## 2019-04-06 LAB — BASIC METABOLIC PANEL
Anion gap: 3 — ABNORMAL LOW (ref 5–15)
BUN: 14 mg/dL (ref 8–23)
CO2: 18 mmol/L — ABNORMAL LOW (ref 22–32)
Calcium: 7.5 mg/dL — ABNORMAL LOW (ref 8.9–10.3)
Chloride: 110 mmol/L (ref 98–111)
Creatinine, Ser: 0.88 mg/dL (ref 0.61–1.24)
GFR calc Af Amer: >60 mL/min (ref >60)
GFR calc non Af Amer: >60 mL/min (ref >60)
Glucose, Bld: 120 mg/dL — ABNORMAL HIGH (ref 70–99)
Potassium: 3.9 mmol/L (ref 3.5–5.1)
Sodium: 131 mmol/L — ABNORMAL LOW (ref 135–145)

## 2019-04-06 LAB — PREPARE RBC (CROSSMATCH)

## 2019-04-06 MED ORDER — FOLIC ACID 1 MG PO TABS
1.0000 mg | ORAL_TABLET | Freq: Every day | ORAL | Status: DC
  Administered 2019-04-06 – 2019-04-17 (×12): 1 mg via ORAL
  Filled 2019-04-06 (×12): qty 1

## 2019-04-06 MED ORDER — SODIUM CHLORIDE 0.9% IV SOLUTION: Freq: Once | INTRAVENOUS | Status: AC

## 2019-04-06 MED ORDER — SPIRONOLACTONE 25 MG PO TABS
100.0000 mg | ORAL_TABLET | Freq: Every day | ORAL | Status: DC
  Administered 2019-04-06 – 2019-04-13 (×5): 100 mg via ORAL
  Filled 2019-04-06 (×8): qty 4

## 2019-04-06 MED ORDER — PEG 3350-KCL-NA BICARB-NACL 420 G PO SOLR
4000.0000 mL | Freq: Once | ORAL | Status: AC
  Administered 2019-04-06: 4000 mL via ORAL
  Filled 2019-04-06: qty 4000

## 2019-04-06 MED ORDER — FUROSEMIDE 40 MG PO TABS
40.0000 mg | ORAL_TABLET | Freq: Every day | ORAL | Status: DC
  Administered 2019-04-06 – 2019-04-13 (×5): 40 mg via ORAL
  Filled 2019-04-06 (×8): qty 1

## 2019-04-06 MED ORDER — THIAMINE HCL 100 MG/ML IJ SOLN
100.0000 mg | Freq: Every day | INTRAMUSCULAR | Status: DC
  Administered 2019-04-06 – 2019-04-09 (×4): 100 mg via INTRAVENOUS
  Filled 2019-04-06 (×4): qty 2

## 2019-04-06 NOTE — TOC Progression Note (Addendum)
Transition of Care [facility]) - Progression Note    Patient Details  Name: Gavin Arroyo MRN: [id number] Date of Birth: 1955-04-25  Transition of Care [facility]) CM/SW Contact  Maryclare Labrador, RN Phone Number: 04/06/2019, 10:49 AM  Clinical Narrative:   CM informed that pt was declined for charity [facility].  Pt will not be able to have [place] at discharge.  Attending made aware    Expected Discharge Plan: [place] Barriers to Discharge: Continued Medical Work up  Expected Discharge Plan and Services Expected Discharge Plan: [place] Choice: [place]: PT, OT [facility] Agency: [facility]     Representative spoke with at [place]: Left VM for liason 04/03/19 at 2:50pm requesting call back   Social Determinants of Health (SDOH) Interventions    Readmission Risk Interventions No flowsheet data found.

## 2019-04-06 NOTE — Progress Notes (Signed)
Patient ID: Gavin Arroyo, male   DOB: [birth year], 63 y.o.   MRN: [id number]   Subjective: HD: 7  Overnight: Hgb dropped to 5.5, received 1 U pRBC  [name] was seen this morning on rounds and was sitting in bed awake and conversational. He has had three bowel movements since yesterday that he describes as being loose and "strawberry" red in color. He denies any abdominal pain, shortness of breath, orthostatic symptoms, bloating, and pain with defecation. We discussed the plan to consult GI again to discuss further intervention. He agreed to this plan saying that something ain't right" and that he wants to get to the bottom of this problem.   Objective:  Vital signs in last 24 hours: Vitals:   04/05/19 2217 04/05/19 2219 04/05/19 2300 04/06/19 0514  BP: 104/61 (!) 111/58 109/62 (!) 94/53  Pulse: 84 84 86 85  Resp: 13 13 12 14   Temp:   98.4 F (36.9 C) 98.5 F (36.9 C)  TempSrc:   Oral Oral  SpO2: 99% 100% 100% 99%  Weight:      Height:       Weight change:   Intake/Output Summary (Last 24 hours) at 04/06/2019 0711 Last data filed at 04/06/2019 0037 Gross per 24 hour  Intake 844.34 ml  Output 425 ml  Net 419.34 ml   Physical Exam  Cardiovascular: Normal rate, regular rhythm and normal heart sounds.  Respiratory: Effort normal and breath sounds normal.  GI: He exhibits fluid wave. There is no abdominal tenderness. A hernia is present. Hernia confirmed positive in the ventral area.  There is evidence of minimal ascites.   Neurological: He is alert.  Skin: Skin is warm and dry.    CBC Latest Ref Rng & Units 04/06/2019 04/05/2019 04/05/2019  WBC 4.0 - 10.5 K/uL 10.6(H) 10.4 -  Hemoglobin 13.0 - 17.0 g/dL 7.1(L) 5.5(LL) 7.5(L)  Hematocrit 39.0 - 52.0 % 21.5(L) 17.3(L) 23.5(L)  Platelets 150 - 400 K/uL 196 189 -   CMP Latest Ref Rng & Units 04/06/2019 04/04/2019 04/03/2019  Glucose 70 - 99 mg/dL 112(H) 115(H) 129(H)  BUN 8 - 23 mg/dL 11 14 17   Creatinine 0.61 - 1.24 mg/dL 0.83  0.91 0.94  Sodium 135 - 145 mmol/L 133(L) 136 138  Potassium 3.5 - 5.1 mmol/L 4.0 4.1 4.1  Chloride 98 - 111 mmol/L 108 111 113(H)  CO2 22 - 32 mmol/L 17(L) 16(L) 15(L)  Calcium 8.9 - 10.3 mg/dL 7.6(L) 8.1(L) 8.2(L)  Total Protein 6.5 - 8.1 g/dL 3.9(L) 4.3(L) 4.4(L)  Total Bilirubin 0.3 - 1.2 mg/dL 1.0 0.7 1.1  Alkaline Phos 38 - 126 U/L 60 56 58  AST 15 - 41 U/L 39 42(H) 35  ALT 0 - 44 U/L 28 28 26    INR: 03/29  1.4 03/26  1.3 03/25  1.3   Assessment/Plan:  Principal Problem:   Acute GI bleeding Active Problems:   Symptomatic anemia   Hepatic cirrhosis (HCC)   Hepatic encephalopathy (HCC)  Summary:  Gavin Arroyo is a 64 year old male with a past medical history of cirrhosis and a large ventral hernia that was admitted for acute hepatic encephalopathy and persistent GI bleed on hospital day 7.   Acute GI bleed, symptomatic anemia: Patient continues to require frequent blood transfusions. Hgb fell to 5.5 last night and post transfusion was 7.1. GI was consulted for further intervention this morning. They are currently recommending an additional colonoscopy given that he is now experiencing bright red blood in  his stool suggestive of a lower GI bleed now, possibly due to recent polypectomy. We also discussed the possibility of TIPS procedure and GI is not recommending at this time as they feel gastropathy is resolving and varices are small and non-bleeding.  - daily CBC - transfuse if Hgb falls below 7; f/u with post transfusion H&H - continue octreotide and PPI - GI following, appreciate recommendations.  - Repeat colonoscopy tomorrow  Hepatic encephalopathy, cirrhosis : No signs of encephalopathy at this time, will monitor closely. There is a fluid wave on exam suggestive of minimal ascites. Currently the patient is not having any symptoms from his ascites and does not feel like his abdomen is more distended.  - continue lactulose 30 g QID   Diet: Clear liquid, NPO after  midnight VTE ppx: SCDs CODE STATUS: Full code  Prior to Admission Living Arrangement: Home Anticipated Discharge Location: Home Barriers to Discharge: Blood transfusion, ongoing medical therapy Dispo: To be determined   LOS: 7 days   Mikael Spray, Medical Student 04/06/2019, 7:11 AM

## 2019-04-06 NOTE — Progress Notes (Addendum)
Gavin Arroyo 2:20 PM  Subjective: Patient without any GI complaints and his hospital computer chart reviewed and his previous work-up reviewed and his case discussed with and seen with our PA and his case discussed with my partner Dr. Penelope Coop  Objective: Vital signs stable afebrile no acute distress abdomen is soft nontender obvious ventral hernia normal bowel sounds labs reviewed  Assessment: Ongoing GI blood loss still concerns over questionable post polypectomy however timing seems to be fairly long  Plan: We will try to repeat colonoscopy tomorrow with further work-up and plans pending those findings and probably can wean octreotide per pharmacy recommendations  [facility]  office [phone] After 5PM or if no answer call [phone]

## 2019-04-06 NOTE — Progress Notes (Addendum)
[facility] Gastroenterology Progress Note  Gavin Arroyo 64 y.o. 11/04/1955   Subjective: Patient is no longer having black stools; however, he has been having bright red blood per rectum for the last several days.  He denies any abdominal pain, nausea, vomiting.  Patient continues to have decreases in hemoglobin requiring transfusions.  Yesterday, hemoglobin was 5.5 he was transfused 1 unit.  Today, his hemoglobin is 7.1.  Objective: Vital signs: Vitals:   04/06/19 0900 04/06/19 1000  BP:    Pulse: 85 89  Resp: 15 11  Temp:    SpO2: 100% 100%    Physical Exam: Gen: alert, no acute distress  HEENT: anicteric sclera; conjunctival pallor CV: RRR Chest: CTA B Abd: Soft, nontender, nondistended.  Large ventral hernia present.  Normoactive bowel sounds. Neuro: Awake and oriented to person and place but not able to report date (reported year is "2020 something").  No asterixis. Ext: no edema  Lab Results: Recent Labs    04/04/19 0558 04/06/19 0156  NA 136 133*  K 4.1 4.0  CL 111 108  CO2 16* 17*  GLUCOSE 115* 112*  BUN 14 11  CREATININE 0.91 0.83  CALCIUM 8.1* 7.6*   Recent Labs    04/04/19 0558 04/06/19 0156  AST 42* 39  ALT 28 28  ALKPHOS 56 60  BILITOT 0.7 1.0  PROT 4.3* 3.9*  ALBUMIN 1.8* 1.7*   Recent Labs    04/05/19 1640 04/06/19 0156  WBC 10.4 10.6*  HGB 5.5* 7.1*  HCT 17.3* 21.5*  MCV 92.0 89.6  PLT 189 196    Assessment/Plan: Continued anemia, now with bright red blood per rectum.  Suspect lower GI bleeding, perhaps continued post-polypectomy bleeding from polypectomies on 03/16/19.  The proximal transverse colon was unable to be reached during repeat colonoscopy on 03/20/19 due to large ventral hernia.  We will plan to proceed with repeat colonoscopy tomorrow to identify source of rectal bleeding.   Melena has resolved.  Continue Protonix BID.  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  Salley Slaughter 04/06/2019, 11:19 AM  Questions please call [phone]

## 2019-04-07 ENCOUNTER — Inpatient Hospital Stay (HOSPITAL_COMMUNITY): Payer: Self-pay | Admitting: Certified Registered Nurse Anesthetist

## 2019-04-07 ENCOUNTER — Encounter (HOSPITAL_COMMUNITY): Payer: Self-pay | Admitting: Student in an Organized Health Care Education/Training Program

## 2019-04-07 ENCOUNTER — Encounter (HOSPITAL_COMMUNITY)
Admission: EM | Disposition: A | Discharge status: Other Acute Inpatient Hospital | Payer: Self-pay | Source: Other Acute Inpatient Hospital | Attending: Student in an Organized Health Care Education/Training Program

## 2019-04-07 HISTORY — PX: COLONOSCOPY WITH PROPOFOL: SHX5780

## 2019-04-07 LAB — TYPE AND SCREEN
ABO/RH(D): A POS
Antibody Screen: NEGATIVE
Unit division: 0
Unit division: 0
Unit division: 0
Unit division: 0
Unit division: 0

## 2019-04-07 LAB — COMPREHENSIVE METABOLIC PANEL
ALT: 29 U/L (ref 0–44)
AST: 57 U/L — ABNORMAL HIGH (ref 15–41)
Albumin: 1.9 g/dL — ABNORMAL LOW (ref 3.5–5.0)
Alkaline Phosphatase: 62 U/L (ref 38–126)
Anion gap: 7 (ref 5–15)
BUN: 12 mg/dL (ref 8–23)
CO2: 19 mmol/L — ABNORMAL LOW (ref 22–32)
Calcium: 7.3 mg/dL — ABNORMAL LOW (ref 8.9–10.3)
Chloride: 108 mmol/L (ref 98–111)
Creatinine, Ser: 0.81 mg/dL (ref 0.61–1.24)
GFR calc Af Amer: >60 mL/min (ref >60)
GFR calc non Af Amer: >60 mL/min (ref >60)
Glucose, Bld: 107 mg/dL — ABNORMAL HIGH (ref 70–99)
Potassium: 3.9 mmol/L (ref 3.5–5.1)
Sodium: 134 mmol/L — ABNORMAL LOW (ref 135–145)
Total Bilirubin: 1.3 mg/dL — ABNORMAL HIGH (ref 0.3–1.2)
Total Protein: 4.1 g/dL — ABNORMAL LOW (ref 6.5–8.1)

## 2019-04-07 LAB — BPAM RBC
Blood Product Expiration Date: 202104212359
Blood Product Expiration Date: 202104252359
Blood Product Expiration Date: 202104252359
Blood Product Expiration Date: 202104252359
Blood Product Expiration Date: 202104262359
ISSUE DATE / TIME: 202103261420
ISSUE DATE / TIME: 202103271134
ISSUE DATE / TIME: 202103272237
ISSUE DATE / TIME: 202103281828
ISSUE DATE / TIME: 202103291234
Unit Type and Rh: 6200
Unit Type and Rh: 6200
Unit Type and Rh: 6200
Unit Type and Rh: 6200
Unit Type and Rh: 6200

## 2019-04-07 LAB — BPAM FFP
Blood Product Expiration Date: 202104012359
ISSUE DATE / TIME: 202103291721
Unit Type and Rh: 6200

## 2019-04-07 LAB — CBC
HCT: 21.3 % — ABNORMAL LOW (ref 39.0–52.0)
Hemoglobin: 7 g/dL — ABNORMAL LOW (ref 13.0–17.0)
MCH: 30.2 pg (ref 26.0–34.0)
MCHC: 32.9 g/dL (ref 30.0–36.0)
MCV: 91.8 fL (ref 80.0–100.0)
Platelets: 181 10*3/uL (ref 150–400)
RBC: 2.32 MIL/uL — ABNORMAL LOW (ref 4.22–5.81)
RDW: 16.9 % — ABNORMAL HIGH (ref 11.5–15.5)
WBC: 9.7 10*3/uL (ref 4.0–10.5)
nRBC: 0 % (ref 0.0–0.2)

## 2019-04-07 LAB — PREPARE FRESH FROZEN PLASMA

## 2019-04-07 LAB — HEMOGLOBIN AND HEMATOCRIT, BLOOD
HCT: 18 % — ABNORMAL LOW (ref 39.0–52.0)
HCT: 21.2 % — ABNORMAL LOW (ref 39.0–52.0)
HCT: 24.4 % — ABNORMAL LOW (ref 39.0–52.0)
Hemoglobin: 6 g/dL — CL (ref 13.0–17.0)
Hemoglobin: 6.8 g/dL — CL (ref 13.0–17.0)
Hemoglobin: 8.1 g/dL — ABNORMAL LOW (ref 13.0–17.0)

## 2019-04-07 LAB — PREPARE RBC (CROSSMATCH)

## 2019-04-07 LAB — PROTIME-INR
INR: 1.3 — ABNORMAL HIGH (ref 0.8–1.2)
Prothrombin Time: 16 seconds — ABNORMAL HIGH (ref 11.4–15.2)

## 2019-04-07 LAB — CALCIUM, IONIZED: Calcium, Ionized, Serum: 5.1 mg/dL (ref 4.5–5.6)

## 2019-04-07 SURGERY — COLONOSCOPY WITH PROPOFOL
Anesthesia: Monitor Anesthesia Care

## 2019-04-07 MED ORDER — PROPOFOL 10 MG/ML IV BOLUS
INTRAVENOUS | Status: DC | PRN
  Administered 2019-04-07: 25 mg via INTRAVENOUS

## 2019-04-07 MED ORDER — SODIUM CHLORIDE 0.9% IV SOLUTION: Freq: Once | INTRAVENOUS | Status: AC

## 2019-04-07 MED ORDER — PHYTONADIONE 5 MG PO TABS
5.0000 mg | ORAL_TABLET | Freq: Every day | ORAL | Status: AC
  Administered 2019-04-08 – 2019-04-10 (×3): 5 mg via ORAL
  Filled 2019-04-07 (×3): qty 1

## 2019-04-07 MED ORDER — PHENYLEPHRINE 40 MCG/ML (10ML) SYRINGE FOR IV PUSH (FOR BLOOD PRESSURE SUPPORT)
PREFILLED_SYRINGE | INTRAVENOUS | Status: DC | PRN
  Administered 2019-04-07: 120 ug via INTRAVENOUS
  Administered 2019-04-07: 160 ug via INTRAVENOUS
  Administered 2019-04-07: 120 ug via INTRAVENOUS
  Administered 2019-04-07 (×2): 80 ug via INTRAVENOUS
  Administered 2019-04-07: 160 ug via INTRAVENOUS

## 2019-04-07 MED ORDER — PROPOFOL 500 MG/50ML IV EMUL
INTRAVENOUS | Status: DC | PRN
  Administered 2019-04-07: 125 ug/kg/min via INTRAVENOUS

## 2019-04-07 MED ORDER — SODIUM CHLORIDE 0.9 % IV SOLN: INTRAVENOUS | Status: DC

## 2019-04-07 MED ORDER — LACTATED RINGERS IV BOLUS
500.0000 mL | Freq: Once | INTRAVENOUS | Status: AC
  Administered 2019-04-07: 500 mL via INTRAVENOUS

## 2019-04-07 MED ORDER — MIDODRINE HCL 5 MG PO TABS
5.0000 mg | ORAL_TABLET | Freq: Three times a day (TID) | ORAL | Status: DC
  Administered 2019-04-08 – 2019-04-13 (×15): 5 mg via ORAL
  Filled 2019-04-07 (×15): qty 1

## 2019-04-07 MED ORDER — EPHEDRINE SULFATE 50 MG/ML IJ SOLN
INTRAMUSCULAR | Status: DC | PRN
  Administered 2019-04-07: 5 mg via INTRAVENOUS

## 2019-04-07 SURGICAL SUPPLY — 22 items

## 2019-04-07 NOTE — Progress Notes (Signed)
Patient ID: Gavin Arroyo, male   DOB: June 14, 1955, 64 y.o.   MRN: [id number]   Subjective: HD: Gavin Arroyo was seen this morning on rounds and was sitting up in bed awake and alert when we arrived. He endorses 4-5 red bowel movements over the last day. He denies any pain with defecation, abdominal pain, shortness of breath, and orthostatic symptoms. He reports feeling alright today and no different from yesterday. We discussed his planned colonoscopy today and he was hopeful that the source of his ongoing GI bleed will be identified. He had no questions or concerns at this time.   Objective:  Vital signs in last 24 hours: Vitals:   04/07/19 0024 04/07/19 0333 04/07/19 0700 04/07/19 1030  BP:   (!) 98/53 (!) 104/48  Pulse:    76  Resp:    12  Temp: 97.9 F (36.6 C) 97.9 F (36.6 C) 98 F (36.7 C) 98.1 F (36.7 C)  TempSrc:   Oral Temporal  SpO2:    99%  Weight:    63.5 kg  Height:    5\' 10"  (1.778 m)   Weight change:   Intake/Output Summary (Last 24 hours) at 04/07/2019 1045 Last data filed at 04/06/2019 1530 Gross per 24 hour  Intake 400 ml  Output --  Net 400 ml   Physical Exam  Cardiovascular: Normal rate, regular rhythm and normal heart sounds.  Pulses:      Radial pulses are 2+ on the right side.  Respiratory: Effort normal and breath sounds normal.  GI: He exhibits fluid wave. There is no abdominal tenderness. A hernia is present. Hernia confirmed positive in the ventral area.  Minimal fluid wave; less noticeable than yesterday  Neurological: He is alert.  Skin: Skin is warm and dry.   CBC Latest Ref Rng & Units 04/07/2019 04/07/2019 04/06/2019  WBC 4.0 - 10.5 K/uL - 9.7 11.0(H)  Hemoglobin 13.0 - 17.0 g/dL 6.0(LL) 7.0(L) 7.6(L)  Hematocrit 39.0 - 52.0 % 18.0(L) 21.3(L) 23.0(L)  Platelets 150 - 400 K/uL - 181 212   CMP Latest Ref Rng & Units 04/07/2019 04/06/2019 04/06/2019  Glucose 70 - 99 mg/dL 107(H) 120(H) 112(H)  BUN 8 - 23 mg/dL 12 14 11   Creatinine 0.61 -  1.24 mg/dL 0.81 0.88 0.83  Sodium 135 - 145 mmol/L 134(L) 131(L) 133(L)  Potassium 3.5 - 5.1 mmol/L 3.9 3.9 4.0  Chloride 98 - 111 mmol/L 108 110 108  CO2 22 - 32 mmol/L 19(L) 18(L) 17(L)  Calcium 8.9 - 10.3 mg/dL 7.3(L) 7.5(L) 7.6(L)  Total Protein 6.5 - 8.1 g/dL 4.1(L) - 3.9(L)  Total Bilirubin 0.3 - 1.2 mg/dL 1.3(H) - 1.0  Alkaline Phos 38 - 126 U/L 62 - 60  AST 15 - 41 U/L 57(H) - 39  ALT 0 - 44 U/L 29 - 28   INR: 03/30  1.3 03/29  1.4 03/28  1.3  Assessment/Plan:  Principal Problem:   Acute GI bleeding Active Problems:   Symptomatic anemia   Hepatic cirrhosis (HCC)   Hepatic encephalopathy (HCC)  Summary:  Gavin Arroyo is a 64 year old male with a past medical history of cirrhosis and large ventral hernia admitted for acute hepatic encephalopathy that has since resolved and an ongoing persistent GI bleed on hospital day 8.   Acute GI bleeding, symptomatic anemia:  This morning hgb has dropped to 6 from 7.0 last night. He continues to report bloody bowel movements, but is otherwise asymptomatic. He is undergoing colonoscopy  today given bright red blood now in stool which may be due a possible post polypectomy bleed. GI following and considering further workup pending results of colonoscopy; appreciate all recommendations.  He has so far needed approximately 20 units of blood over the course of 3 weeks now and it appears that medical management is not going to be adequate.    - started on 3 days of 5 mg vitamin K supplementation  - receiving 1 U pRBC this morning; f/u with post transfusion H&H  - continue PPI  - f/u GI recommendation to wean off octreotide; consider initiating nonselective beta blocker when GI bleed can be controlled - colonoscopy today   Hepatic cirrhosis and acute encephalopathy:  Encephalopathy has resolved with lactulose therapy. This was his only episode of encephalopathy he has experienced and was in the setting of Ativan administration. There is still  evidence of mild ascites on exam, but appears to be decreasing. Restarted spironolactone and lasix yesterday in setting os ascites. Blood pressures have remained on the lower end of normal, but this seems to be his baseline. MAPs have remained above 65 and he appears to be tolerating these medications well.  - continue lactulose 30 g QID  - continue spironolactone and lasix  Diet: VTE [id number] CODE STATUS:Full code  Prior to Admission Living Arrangement:Home Anticipated Discharge Location:Home Barriers to [place] transfusion, ongoing medical therapy Dispo:To be determined   LOS: 8 days   Mikael Spray, Medical Student 04/07/2019, 10:45 AM

## 2019-04-07 NOTE — Progress Notes (Signed)
CRITICAL VALUE ALERT  Critical Value:  Hemoglobin 6.0   Date & Time Notied:  04/07/19 08:35  Provider Notified: dr. Marianna Payment  Orders Received/Actions taken: awaiting call back

## 2019-04-07 NOTE — Anesthesia Procedure Notes (Signed)
Procedure Name: MAC Date/Time: 04/07/2019 11:35 AM Performed by: Inda Coke, CRNA Pre-anesthesia Checklist: Patient identified, Emergency Drugs available, Suction available, Patient being monitored and Timeout performed Patient Re-evaluated:Patient Re-evaluated prior to induction Oxygen Delivery Method: Nasal cannula

## 2019-04-07 NOTE — Progress Notes (Signed)
PT Cancellation Note  Patient Details Name: Gavin Arroyo MRN: [id number] DOB: May 11, 1955   Cancelled Treatment:    Reason Eval/Treat Not Completed: (P) Patient at procedure or test/unavailable Pt off floor for endoscopy. PT will follow back this afternoon for treatment as able.   Tristy Udovich B. Migdalia Dk PT, DPT Acute Rehabilitation Services Pager [phone] Office [phone]    [place] 04/07/2019, 10:18 AM

## 2019-04-07 NOTE — Op Note (Signed)
[facility] Patient Name: Gavin Arroyo Procedure Date : 04/07/2019 MRN: [id number] Attending MD: Clarene Essex , MD Date of Birth: 1955/04/24 CSN: [id number] Age: 64 Admit Type: Inpatient Procedure:                Colonoscopy Indications:              Hematochezia, Acute post hemorrhagic anemia Providers:                Clarene Essex, MD, Glori Bickers, RN, Theodora Blow,                            Technician Referring MD:              Medicines:                Propofol total dose 123456 mg IV Complications:            No immediate complications. Estimated Blood Loss:     Estimated blood loss: none. Procedure:                Pre-Anesthesia Assessment:                           - Prior to the procedure, a History and Physical                            was performed, and patient medications and                            allergies were reviewed. The patient's tolerance of                            previous anesthesia was also reviewed. The risks                            and benefits of the procedure and the sedation                            options and risks were discussed with the patient.                            All questions were answered, and informed consent                            was obtained. Prior Anticoagulants: The patient has                            taken no previous anticoagulant or antiplatelet                            agents. ASA Grade Assessment: III - A patient with                            severe systemic disease. After reviewing the risks  and benefits, the patient was deemed in                            satisfactory condition to undergo the procedure.                           After obtaining informed consent, the colonoscope                            was passed under direct vision. Throughout the                            procedure, the patient's blood pressure, pulse, and                            oxygen saturations  were monitored continuously. The                            [id number] Olympus pediatric colonoscope                            was introduced through the anus and advanced to the                            the cecum, identified by appendiceal orifice and                            ileocecal valve. The ileocecal valve, appendiceal                            orifice, and rectum were photographed. The                            colonoscopy was somewhat difficult due to ventral                            hernia. Successful completion of the procedure was                            aided by applying abdominal pressure. The patient                            tolerated the procedure well. The quality of the                            bowel preparation was poor. Scope In: 11:24:18 AM Scope Out: 11:55:37 AM Scope Withdrawal Time: 0 hours 14 minutes 5 seconds  Total Procedure Duration: 0 hours 31 minutes 19 seconds  Findings:      Clotted blood and old melena was found in the entire colon. Lavage of       the area was performed using a moderate amount of sterile water,       resulting in incomplete clearance with fair visualization.      The exam was otherwise without abnormality. Impression:               -  Preparation of the colon was poor.                           - Blood in the entire examined colon. No active                            bleeding seen                           - The examination was otherwise normal.                           - No specimens collected. Recommendation:           - Clear liquid diet indefinitely. Expect him to                            pass a moderate amount of old blood but if he                            continues to have signs of bleeding and even                            consider a nuclear bleeding scan or repeat endoscopy                           - Continue present medications. Possibly treat                            portal hypertension  with Inderal and add Carafate                            as well but continue octreotide for now                           - Return to GI clinic PRN.                           - Telephone GI clinic if symptomatic PRN. Procedure Code(s):        --- Professional ---                           [phone], Colonoscopy, flexible; diagnostic, including                            collection of specimen(s) by brushing or washing,                            when performed (separate procedure) Diagnosis Code(s):        --- Professional ---                           K92.2, Gastrointestinal hemorrhage, unspecified  K92.1, Melena (includes Hematochezia)                           D62, Acute posthemorrhagic anemia CPT copyright 2019 [employer]. All rights reserved. The codes documented in this report are preliminary and upon coder review may  be revised to meet current compliance requirements. Clarene Essex, MD 04/07/2019 12:06:11 PM This report has been signed electronically. Number of Addenda: 0

## 2019-04-07 NOTE — Anesthesia Preprocedure Evaluation (Signed)
Anesthesia Evaluation  Patient identified by MRN, date of birth, ID band Patient awake    Reviewed: Allergy & Precautions, H&P , NPO status , Patient's Chart, lab work & pertinent test results  Airway Mallampati: II  TM Distance: >3 FB Neck ROM: Full    Dental no notable dental hx. (+) Poor Dentition, Dental Advisory Given   Pulmonary Current Smoker and Patient abstained from smoking.,    Pulmonary exam normal breath sounds clear to auscultation       Cardiovascular negative cardio ROS   Rhythm:Regular Rate:Normal     Neuro/Psych negative neurological ROS  negative psych ROS   GI/Hepatic negative GI ROS, (+)     substance abuse  alcohol use, GI Bleed   Endo/Other  negative endocrine ROS  Renal/GU negative Renal ROS  negative genitourinary   Musculoskeletal   Abdominal   Peds  Hematology  (+) Blood dyscrasia, anemia ,   Anesthesia Other Findings   Reproductive/Obstetrics negative OB ROS                             Anesthesia Physical  Anesthesia Plan  ASA: III  Anesthesia Plan: MAC   Post-op Pain Management:    Induction: Intravenous  PONV Risk Score and Plan: 1 and Propofol infusion  Airway Management Planned: Nasal Cannula  Additional Equipment:   Intra-op Plan:   Post-operative Plan:   Informed Consent: I have reviewed the patients History and Physical, chart, labs and discussed the procedure including the risks, benefits and alternatives for the proposed anesthesia with the patient or authorized representative who has indicated his/her understanding and acceptance.     Dental advisory given  Plan Discussed with: CRNA  Anesthesia Plan Comments:         Anesthesia Quick Evaluation

## 2019-04-07 NOTE — Progress Notes (Signed)
Occupational Therapy Treatment Patient Details Name: Gavin Arroyo MRN: [id number] DOB: [birth year] Today's Date: 04/07/2019    History of present illness  64 y/o male, with a PMH of GI bleed, ventral hernia, and compensated liver cirrhosis, who presents to [facility] from [place] for a GI bleed. Per [facility], patient presented to [facility] he was found to have a hemoglobin of 5.8 with an elevated ammonia level of 87.   OT comments  Pt progressing with OT goals, but continues to be limited by low hgb/BP readings, decreased standing balance, and decreased activity tolerance. Pt Supervision for bed mobility to sit EOB. Min guard for sit to stand transfers and mobility around foot of bed to recliner chair without AD. Pt min guard to supervision for standing balance challenges. Assessed BP throughout session at each transitional movement with BP readings continuing to be low. Pt reports mild dizziness. Will continue to follow acutely. 2   Follow Up Recommendations  Home health OT;Supervision/Assistance - 24 hour    Equipment Recommendations  None recommended by OT    Recommendations for Other Services      Precautions / Restrictions Precautions Precautions: Fall;Other (comment) Precaution Comments: watch BP, low hgb Restrictions Weight Bearing Restrictions: No       Mobility Bed Mobility Overal bed mobility: Needs Assistance Bed Mobility: Supine to Sit     Supine to sit: HOB elevated;Supervision        Transfers Overall transfer level: Needs assistance Equipment used: None Transfers: Sit to/from [facility] Sit to Stand: Min guard Stand pivot transfers: Min guard       General transfer comment: min guard to ensure safety due to low BP, unsteadiness with mobility     Balance Overall balance assessment: Needs assistance Sitting-balance support: Feet supported Sitting balance-Leahy Scale: Good     Standing balance support: No upper extremity supported;During  functional activity Standing balance-Leahy Scale: Fair                             ADL either performed or assessed with clinical judgement   ADL Overall ADL's : Needs assistance/impaired                                     Functional mobility during ADLs: Min guard       Vision       Perception     Praxis      Cognition Arousal/Alertness: Awake/alert Behavior During Therapy: WFL for tasks assessed/performed Overall Cognitive Status: Within Functional Limits for tasks assessed                                          Exercises     Shoulder Instructions       General Comments Pt reports mild dizziness. BP readings throughout: 104/56 at rest, 113/71 sitting EOB, 101/56 standing EOB, 105/61 standing for 3 min, 76/49 after mobility/up in chair (was moving arm during reading - unsure of accuracy) , 96/59 after > 3 min in chair     Pertinent Vitals/ Pain       Pain Assessment: No/denies pain  Home Living  Prior Functioning/Environment              Frequency  Min 3X/week        Progress Toward Goals  OT Goals(current goals can now be found in the care plan section)  Progress towards OT goals: Progressing toward goals  Acute Rehab OT Goals Patient Stated Goal: Pt agreeable to working with therapy Time For Goal Achievement: 04/14/19 Potential to Achieve Goals: Good ADL Goals Pt Will Perform Grooming: with modified independence;standing Pt Will Perform Lower Body Bathing: with modified independence;sit to/from stand Pt Will Perform Lower Body Dressing: with modified independence;sit to/from stand Pt Will Transfer to Toilet: with modified independence;ambulating;regular height toilet Pt Will Perform Toileting - Clothing Manipulation and hygiene: with modified independence;sit to/from stand Pt Will Perform Tub/Shower Transfer: Tub transfer;with min guard  assist;shower seat;grab bars  Plan Discharge plan remains appropriate    Co-evaluation    PT/OT/SLP Co-Evaluation/Treatment: Yes Reason for Co-Treatment: Other (comment)(Low BP)   OT goals addressed during session: ADL's and self-care;Other (comment)(ADL transfers)      AM-PAC OT "6 Clicks" Daily Activity     Outcome Measure   Help from another person eating meals?: None Help from another person taking care of personal grooming?: A Little Help from another person toileting, which includes using toliet, bedpan, or urinal?: A Little Help from another person bathing (including washing, rinsing, drying)?: A Little Help from another person to put on and taking off regular upper body clothing?: A Little Help from another person to put on and taking off regular lower body clothing?: A Little 6 Click Score: 19    End of Session Equipment Utilized During Treatment: Gait belt  OT Visit Diagnosis: Unsteadiness on feet (R26.81);Muscle weakness (generalized) (M62.81)   Activity Tolerance Patient tolerated treatment well;Treatment limited secondary to medical complications (Comment)(Limited by low BP, low hgb)   Patient Left in chair;with call bell/phone within reach   Nurse Communication Mobility status;Other (comment)(BP readings)        Time: [id number] OT Time Calculation (min): 25 min  Charges: OT General Charges $OT Visit: 1 Visit OT Treatments $Therapeutic Activity: 8-22 mins  Layla Maw, OTR/L   Layla Maw 04/07/2019, 4:12 PM

## 2019-04-07 NOTE — TOC Progression Note (Signed)
Transition of Care [facility]) - Progression Note    Patient Details  Name: Gavin Arroyo MRN: [id number] Date of Birth: 1955/09/29  Transition of Care [facility]) CM/SW Contact  Neveyah Garzon, Abelino Derrick, RN Phone Number: 04/07/2019, 9:56 AM  Clinical Narrative:   [facility] will accept pt for charity [facility] listed below-pending orders    Expected Discharge Plan: [place] Barriers to Discharge: Continued Medical Work up  Expected Discharge Plan and Services Expected Discharge Plan: [place] Choice: [place]: PT, OT [facility] Agency: [facility] (now [facility]) Date [place]: 04/07/19 Time [place]: [phone] Representative spoke with at [place]: [place] ([place]) Interventions    Readmission Risk Interventions No flowsheet data found.

## 2019-04-07 NOTE — Progress Notes (Signed)
Gavin Arroyo 11:04 AM  Subjective: Patient tolerated prep initially dark now strawberry colored he says no new complaints no abdominal pain  Objective: Vital signs stable afebrile exam unchanged ventral hernia nontender hemoglobin decrease BUN okay  Assessment: GI blood loss questionable post polypectomy versus portal gastropathy  Plan: Okay to proceed with reattempt at colonoscopy with anesthesia assistance  [facility]  office [phone] After 5PM or if no answer call [phone]

## 2019-04-07 NOTE — Transfer of Care (Signed)
Immediate Anesthesia Transfer of Care Note  Patient: Gavin Arroyo  Procedure(s) Performed: COLONOSCOPY WITH PROPOFOL (N/A )  Patient Location: PACU and Endoscopy Unit  Anesthesia Type:MAC  Level of Consciousness: awake and alert   Airway & Oxygen Therapy: Patient Spontanous Breathing  Post-op Assessment: Report given to RN and Post -op Vital signs reviewed and stable  Post vital signs: Reviewed and stable  Last Vitals:  Vitals Value Taken Time  BP 86/31 04/07/19 1209  Temp 36.3 C 04/07/19 1207  Pulse 78 04/07/19 1212  Resp 11 04/07/19 1212  SpO2 100 % 04/07/19 1212  Vitals shown include unvalidated device data.  Last Pain:  Vitals:   04/07/19 1207  TempSrc: Temporal  PainSc: 0-No pain         Complications: No apparent anesthesia complications

## 2019-04-07 NOTE — Progress Notes (Signed)
Physical Therapy Treatment Patient Details Name: Gavin Arroyo MRN: [id number] DOB: Mar 20, 1955 Today's Date: 04/07/2019    History of Present Illness  64 y/o male, with a PMH of GI bleed, ventral hernia, and compensated liver cirrhosis, who presents to [facility] from [place] for a GI bleed. Per [facility], patient presented to [facility] he was found to have a hemoglobin of 5.8 with an elevated ammonia level of 87.    PT Comments    Pt sitting on EoB working with OT on entry, experiencing low blood pressure this afternoon.  Pt tolerated sitting w/o dizziness, so came into standing with RW and BP dropped. Continued standing for 3 minutes to see if BP would recover and practiced balance. After 3 minutes, BP noted to be grossly the same so pt ambulated to 12 feet to recliner with min guard with mildly unsteady gait. BP dropped significantly with gait. With sitting up in recliner 3 minutes BP returned to levels commensurate with initial levels at beginning of session. D/c plans remain appropriate. PT will continue to follow acutely.    Follow Up Recommendations  Home health PT;Supervision/Assistance - 24 hour     Equipment Recommendations  Rolling walker with 5" wheels;3in1 (PT)       Precautions / Restrictions Precautions Precautions: Fall;Other (comment) Precaution Comments: watch BP, low hgb Restrictions Weight Bearing Restrictions: No    Mobility  Bed Mobility Overal bed mobility: Needs Assistance Bed Mobility: Supine to Sit     Supine to sit: HOB elevated;Supervision        Transfers Overall transfer level: Needs assistance Equipment used: None Transfers: Sit to/from [facility] Sit to Stand: Min guard Stand pivot transfers: Min guard       General transfer comment: min guard to ensure safety due to low BP, unsteadiness with mobility   Ambulation/Gait Ambulation/Gait assistance: Min guard Gait Distance (Feet): 12 Feet Assistive device: Rolling walker (2  wheeled) Gait Pattern/deviations: Step-through pattern;Decreased step length - right;Decreased step length - left;Shuffle;Drifts right/left Gait velocity: slowed Gait velocity interpretation: <1.31 ft/sec, indicative of household ambulator General Gait Details: hands on min guard for safety        Balance Overall balance assessment: Needs assistance Sitting-balance support: Feet supported Sitting balance-Leahy Scale: Good     Standing balance support: No upper extremity supported;During functional activity Standing balance-Leahy Scale: Fair   Single Leg Stance - Right Leg: 10 Single Leg Stance - Left Leg: 10 Tandem Stance - Right Leg: 30 Tandem Stance - Left Leg: 20                    Cognition Arousal/Alertness: Awake/alert Behavior During Therapy: WFL for tasks assessed/performed Overall Cognitive Status: Within Functional Limits for tasks assessed                                           General Comments General comments (skin integrity, edema, etc.): Pt reports mild dizziness. BP readings throughout: 104/56 at rest, 113/71 sitting EOB, 101/56 standing EOB, 105/61 standing for 3 min, 76/49 after mobility/up in chair (was moving arm during reading - unsure of accuracy) , 96/59 after > 3 min in chair       Pertinent Vitals/Pain Pain Assessment: No/denies pain           PT Goals (current goals can now be found in the care plan section) Acute Rehab PT Goals  Patient Stated Goal: Pt agreeable to working with therapy PT Goal Formulation: With patient Time For Goal Achievement: 04/14/19 Potential to Achieve Goals: Good Progress towards PT goals: Progressing toward goals    Frequency    Min 3X/week      PT Plan Current plan remains appropriate    Co-evaluation PT/OT/SLP Co-Evaluation/Treatment: Yes Reason for Co-Treatment: Other (comment)(low BP )   OT goals addressed during session: ADL's and self-care;Other (comment)(ADL  transfers)      AM-PAC PT "6 Clicks" Mobility   Outcome Measure  Help needed turning from your back to your side while in a flat bed without using bedrails?: None Help needed moving from lying on your back to sitting on the side of a flat bed without using bedrails?: A Little Help needed moving to and from a bed to a chair (including a wheelchair)?: A Little Help needed standing up from a chair using your arms (e.g., wheelchair or bedside chair)?: A Little Help needed to walk in hospital room?: A Lot Help needed climbing 3-5 steps with a railing? : A Lot 6 Click Score: 17    End of Session Equipment Utilized During Treatment: Gait belt Activity Tolerance: Patient tolerated treatment well Patient left: in chair;with call bell/phone within reach;with chair alarm set Nurse Communication: Mobility status PT Visit Diagnosis: Unsteadiness on feet (R26.81);Other abnormalities of gait and mobility (R26.89);Muscle weakness (generalized) (M62.81);Difficulty in walking, not elsewhere classified (R26.2)     Time: [id number] PT Time Calculation (min) (ACUTE ONLY): 17 min  Charges:  $Therapeutic Activity: 8-22 mins                     Aiyla Baucom B. Migdalia Dk PT, DPT Acute Rehabilitation Services Pager [phone] Office [phone]    [place] 04/07/2019, 4:38 PM

## 2019-04-07 NOTE — Anesthesia Postprocedure Evaluation (Signed)
Anesthesia Post Note  Patient: ANAS REISTER  Procedure(s) Performed: COLONOSCOPY WITH PROPOFOL (N/A )     Patient location during evaluation: PACU Anesthesia Type: MAC Level of consciousness: awake and alert Pain management: pain level controlled Vital Signs Assessment: post-procedure vital signs reviewed and stable Respiratory status: spontaneous breathing, nonlabored ventilation, respiratory function stable and patient connected to nasal cannula oxygen Cardiovascular status: stable and blood pressure returned to baseline Postop Assessment: no apparent nausea or vomiting Anesthetic complications: no    Last Vitals:  Vitals:   04/07/19 1224 04/07/19 1300  BP: (!) 94/57 99/61  Pulse: 75   Resp: 14   Temp:  37 C  SpO2: 100%     Last Pain:  Vitals:   04/07/19 1300  TempSrc: Oral  PainSc:                  Akiera Allbaugh

## 2019-04-07 NOTE — Plan of Care (Signed)
  Problem: Education: Goal: Knowledge of General Education information will improve Description Including pain rating scale, medication(s)/side effects and non-pharmacologic comfort measures Outcome: Progressing   Problem: Health Behavior/Discharge Planning: Goal: Ability to manage health-related needs will improve Outcome: Progressing   

## 2019-04-08 ENCOUNTER — Inpatient Hospital Stay (HOSPITAL_COMMUNITY): Payer: Self-pay

## 2019-04-08 DIAGNOSIS — R188 Other ascites: Secondary | ICD-10-CM

## 2019-04-08 LAB — BPAM RBC
Blood Product Expiration Date: 202104252359
Blood Product Expiration Date: 202104262359
ISSUE DATE / TIME: 202103301044
ISSUE DATE / TIME: 202103301642
Unit Type and Rh: 6200
Unit Type and Rh: 6200

## 2019-04-08 LAB — CBC
HCT: 27.1 % — ABNORMAL LOW (ref 39.0–52.0)
Hemoglobin: 9 g/dL — ABNORMAL LOW (ref 13.0–17.0)
MCH: 30.5 pg (ref 26.0–34.0)
MCHC: 33.2 g/dL (ref 30.0–36.0)
MCV: 91.9 fL (ref 80.0–100.0)
Platelets: 234 10*3/uL (ref 150–400)
RBC: 2.95 MIL/uL — ABNORMAL LOW (ref 4.22–5.81)
RDW: 16.4 % — ABNORMAL HIGH (ref 11.5–15.5)
WBC: 8.7 10*3/uL (ref 4.0–10.5)
nRBC: 0 % (ref 0.0–0.2)

## 2019-04-08 LAB — COMPREHENSIVE METABOLIC PANEL
ALT: 29 U/L (ref 0–44)
AST: 46 U/L — ABNORMAL HIGH (ref 15–41)
Albumin: 1.9 g/dL — ABNORMAL LOW (ref 3.5–5.0)
Alkaline Phosphatase: 64 U/L (ref 38–126)
Anion gap: 8 (ref 5–15)
BUN: 8 mg/dL (ref 8–23)
CO2: 17 mmol/L — ABNORMAL LOW (ref 22–32)
Calcium: 7.2 mg/dL — ABNORMAL LOW (ref 8.9–10.3)
Chloride: 109 mmol/L (ref 98–111)
Creatinine, Ser: 0.85 mg/dL (ref 0.61–1.24)
GFR calc Af Amer: >60 mL/min (ref >60)
GFR calc non Af Amer: >60 mL/min (ref >60)
Glucose, Bld: 98 mg/dL (ref 70–99)
Potassium: 3.3 mmol/L — ABNORMAL LOW (ref 3.5–5.1)
Sodium: 134 mmol/L — ABNORMAL LOW (ref 135–145)
Total Bilirubin: 1.4 mg/dL — ABNORMAL HIGH (ref 0.3–1.2)
Total Protein: 4.4 g/dL — ABNORMAL LOW (ref 6.5–8.1)

## 2019-04-08 LAB — MAGNESIUM: Magnesium: 1.8 mg/dL (ref 1.7–2.4)

## 2019-04-08 LAB — TYPE AND SCREEN
ABO/RH(D): A POS
Antibody Screen: NEGATIVE
Unit division: 0
Unit division: 0

## 2019-04-08 LAB — PROTIME-INR
INR: 1.3 — ABNORMAL HIGH (ref 0.8–1.2)
Prothrombin Time: 15.7 seconds — ABNORMAL HIGH (ref 11.4–15.2)

## 2019-04-08 LAB — PHOSPHORUS: Phosphorus: 2.5 mg/dL (ref 2.5–4.6)

## 2019-04-08 LAB — HEMOGLOBIN AND HEMATOCRIT, BLOOD
HCT: 27.2 % — ABNORMAL LOW (ref 39.0–52.0)
Hemoglobin: 8.8 g/dL — ABNORMAL LOW (ref 13.0–17.0)

## 2019-04-08 MED ORDER — PROPRANOLOL HCL 10 MG PO TABS
10.0000 mg | ORAL_TABLET | Freq: Two times a day (BID) | ORAL | Status: DC
  Administered 2019-04-08 – 2019-04-13 (×7): 10 mg via ORAL
  Filled 2019-04-08 (×12): qty 1

## 2019-04-08 MED ORDER — TECHNETIUM TC 99M-LABELED RED BLOOD CELLS IV KIT
25.0000 | PACK | Freq: Once | INTRAVENOUS | Status: AC | PRN
  Administered 2019-04-08: 25 via INTRAVENOUS

## 2019-04-08 MED ORDER — SUCRALFATE 1 GM/10ML PO SUSP
1.0000 g | Freq: Three times a day (TID) | ORAL | Status: DC
  Administered 2019-04-08 – 2019-04-17 (×37): 1 g via ORAL
  Filled 2019-04-08 (×38): qty 10

## 2019-04-08 MED ORDER — POTASSIUM CHLORIDE CRYS ER 20 MEQ PO TBCR
20.0000 meq | EXTENDED_RELEASE_TABLET | Freq: Two times a day (BID) | ORAL | Status: AC
  Administered 2019-04-08 (×2): 20 meq via ORAL
  Filled 2019-04-08 (×2): qty 1

## 2019-04-08 MED ORDER — BOOST / RESOURCE BREEZE PO LIQD CUSTOM
1.0000 | Freq: Three times a day (TID) | ORAL | Status: DC
  Administered 2019-04-08 – 2019-04-13 (×15): 1 via ORAL

## 2019-04-08 MED ORDER — CALCIUM CARBONATE 1250 (500 CA) MG PO TABS
1.0000 | ORAL_TABLET | Freq: Two times a day (BID) | ORAL | Status: AC
  Administered 2019-04-08 (×2): 500 mg via ORAL
  Filled 2019-04-08 (×2): qty 1

## 2019-04-08 MED ORDER — PRO-STAT SUGAR FREE PO LIQD
30.0000 mL | Freq: Three times a day (TID) | ORAL | Status: DC
  Administered 2019-04-08 – 2019-04-17 (×26): 30 mL via ORAL
  Filled 2019-04-08 (×28): qty 30

## 2019-04-08 NOTE — Progress Notes (Signed)
PT Cancellation Note  Patient Details Name: Gavin Arroyo MRN: [id number] DOB: 09/29/1955   Cancelled Treatment:    Reason Eval/Treat Not Completed: Patient at procedure or test/unavailable.  Will reattempt as time and pt allow.   Ramond Dial 04/08/2019, 1:28 PM   Mee Hives, PT MS Acute Rehab Dept. Number: [place] and [place]

## 2019-04-08 NOTE — Progress Notes (Signed)
Nutrition Follow-up  DOCUMENTATION CODES:   Non-severe (moderate) malnutrition in context of chronic illness  INTERVENTION:  Provide Boost Breeze po TID, each supplement provides 250 kcal and 9 grams of protein.  Provide 30 ml Prostat po TID, each supplement provides 100 kcal and 15 grams of protein.   Encourage adequate PO intake.   NUTRITION DIAGNOSIS:   Moderate Malnutrition related to chronic illness(cirrhosis) as evidenced by moderate muscle depletion, moderate fat depletion; ongoing  GOAL:   Patient will meet greater than or equal to 90% of their needs; progressing  MONITOR:   PO intake, Supplement acceptance, Skin, Diet advancement, Weight trends, Labs, I & O's  REASON FOR ASSESSMENT:   Consult Assessment of nutrition requirement/status  ASSESSMENT:   64 year old male with a past medical history of cirrhosis, GI bleed, and ventral hernia who presented with acute encephalopathy and admitted for decompensated cirrhosis and persistent GI bleed EGD 3/25.   Pt underwent colonoscopy yesterday which revealed blood in the entire examined colon but no active bleeding seen. Per MD, plans to continue on a clear liquid diet until bleeding not present. RD to order nutritional supplements to aid in caloric and protein needs. Labs and medications reviewed.   Diet Order:   Diet Order            Diet clear liquid Room service appropriate? Yes; Fluid consistency: Thin  Diet effective now              EDUCATION NEEDS:   Not appropriate for education at this time  Skin:  Skin Assessment: Reviewed RN Assessment  Last BM:  3/31  Height:   Ht Readings from Last 1 Encounters:  04/07/19 5\' 10"  (1.778 m)    Weight:   Wt Readings from Last 1 Encounters:  04/07/19 63.5 kg    Ideal Body Weight:  75.45 kg  BMI:  Body mass index is 20.09 kg/m.  Estimated Nutritional Needs:   Kcal:  2200-2400  Protein:  115-125 grams  Fluid:  >/= 2 L/day   Corrin Parker, MS,  RD, LDN RD pager number/after hours weekend pager number on Amion.

## 2019-04-08 NOTE — Progress Notes (Signed)
Patient ID: Gavin Arroyo, male   DOB: 1955-11-07, 64 y.o.   MRN: [id number]   Subjective: HD: Gavin Arroyo was seen this morning on rounds and reports feeling great. He endorses one semi-formed stool that was brown in color. He says that he felt as if he needed to have another bowel movement, but it never came. He denies any abdominal pain, shortness of breath, orthostatic dizziness.  We discussed the plan for tagged RBC study today and that pending results there may be further work up with angiography and intervention to control bleed if identified. He agreed with this plan and had no questions or concerns at this time.  He did mention feeling hungry and being tired of a liquid diet. We discussed this and he understands the importance of liquid diet for now and is hopeful to return to a more normal diet in the next few days.    Objective:  Vital signs in last 24 hours: Vitals:   04/07/19 1920 04/07/19 2000 04/08/19 0006 04/08/19 0430  BP: 106/60 (!) 90/53 (!) 96/50 (!) 114/59  Pulse: 76 69 72 69  Resp: 18 10 17 18   Temp: 98.5 F (36.9 C)  98.6 F (37 C) 98 F (36.7 C)  TempSrc: Oral  Oral Oral  SpO2: 100% 99% 98% 97%  Weight:      Height:       Weight change:   Intake/Output Summary (Last 24 hours) at 04/08/2019 0701 Last data filed at 04/08/2019 0430 Gross per 24 hour  Intake 1030 ml  Output 1200 ml  Net -170 ml   Physical Exam  Constitutional: He is oriented to person, place, and time.  Cardiovascular: Normal rate, regular rhythm, normal heart sounds and intact distal pulses.  Pulmonary/Chest: Effort normal and breath sounds normal.  Abdominal: Soft. He exhibits ascites. There is no abdominal tenderness. A hernia is present. Hernia confirmed positive in the ventral area.  Neurological: He is alert and oriented to person, place, and time.  Skin: Skin is warm and dry.   H&H: Hgb  8.8 HCT 27.2  CBC Latest Ref Rng & Units 04/08/2019 04/07/2019 04/07/2019  WBC 4.0 - 10.5  K/uL 8.7 - -  Hemoglobin 13.0 - 17.0 g/dL 9.0(L) 8.1(L) 6.8(LL)  Hematocrit 39.0 - 52.0 % 27.1(L) 24.4(L) 21.2(L)  Platelets 150 - 400 K/uL 234 - -   CMP Latest Ref Rng & Units 04/08/2019 04/07/2019 04/06/2019  Glucose 70 - 99 mg/dL 98 107(H) 120(H)  BUN 8 - 23 mg/dL 8 12 14   Creatinine 0.61 - 1.24 mg/dL 0.85 0.81 0.88  Sodium 135 - 145 mmol/L 134(L) 134(L) 131(L)  Potassium 3.5 - 5.1 mmol/L 3.3(L) 3.9 3.9  Chloride 98 - 111 mmol/L 109 108 110  CO2 22 - 32 mmol/L 17(L) 19(L) 18(L)  Calcium 8.9 - 10.3 mg/dL 7.2(L) 7.3(L) 7.5(L)  Total Protein 6.5 - 8.1 g/dL 4.4(L) 4.1(L) -  Total Bilirubin 0.3 - 1.2 mg/dL 1.4(H) 1.3(H) -  Alkaline Phos 38 - 126 U/L 64 62 -  AST 15 - 41 U/L 46(H) 57(H) -  ALT 0 - 44 U/L 29 29 -   INR: 03/31  1.3 03/30  1.3 03/29  1.4  Magnesium: 03/31  1.8  Phosphorus: 03/31  2.5  Colonoscopy:  No active bleeding. Clotted blood and melana throughout colon.   Assessment/Plan:  Principal Problem:   Acute GI bleeding Active Problems:   Symptomatic anemia   Hepatic cirrhosis ([place])   Hepatic encephalopathy ([place])  Summary:  Gavin Arroyo is a 64 year old male with a past medical history of cirrhosis and a large ventral hernia admitted for acute hepatic encephalopathy that has since resolved and ongoing persistent GI bleeding on hospital day 9.   Acute GI bleed, symptomatic anemia:  Colonoscopy was done yesterday which did not reveal any active bleed, however there was clotted blood and melena throughout the entire length of the colon. The patient continues to have melena and hematochezia. He has had 3 endoscopies and 3 colonoscopies at this point without definitive identification of bleeding source. His Hgb continue to drop daily, requiring transfusions nearly every day. Yesterday he received 2 transfusions; requiring a total of approximately 20 units of blood over the past month. This morning's Hgb is at 8.8 and he reports having a brown non-bloody  non-melanoticstool overnight which is reassuring that his bleeding may have stopped or slowed. However, this has happened during his prior admission and bleeding resumed and he was readmitted about 12 hours after discharge. Therefore, we will proceed with continued workup to identify source of bleeding.  - IR consulted this morning and plan to do tagged RBC study today - started propranolol, - start carafate for gastritis - continue octreotide - on day 2 of vitamin K supplementation  - trend CBC; transfuse is Hgb below 7  - continue PPI  - GI following; appreciate reccs  Hepatic cirrhosis and encephalopathy: Encephalopathy has resolved with lactulose therapy. There is evidence of minimal ascites on exam today. - continue lactulose 30 g QID - continue spironolactone and lasix   Diet:clear liquids VTE [id number] CODE STATUS:Full code  Prior to Admission Living Arrangement:Home Anticipated Discharge Location:Home Barriers to [place] transfusion, ongoing medical therapy Dispo:To be determined     LOS: 9 days   Mikael Spray, Medical Student 04/08/2019, 7:01 AM

## 2019-04-08 NOTE — Progress Notes (Signed)
Physical Therapy Treatment Patient Details Name: Gavin Arroyo MRN: [id number] DOB: 06-02-1955 Today's Date: 04/08/2019    History of Present Illness  64 y/o male, with a PMH of GI bleed, ventral hernia, and compensated liver cirrhosis, who presents to [facility] from [place] for a GI bleed. Per [facility], patient presented to [facility] he was found to have a hemoglobin of 5.8 with an elevated ammonia level of 87.    PT Comments    Pt was seen for mobility with ck of BP: sitting 126/88 and standing was 143/70, post gait on hallway was 119/65.  Passed along to nursing, then took for a walk on hallway with care to control speed and distance due to recent hypotensive episodes.  Follow acutely for these needs, including monitoring vitals and making progress on LE strengthening as tolerated.  Pt is motivated and willing to work hard, but may need to be guided not to overdo.    Follow Up Recommendations  Home health PT;Supervision/Assistance - 24 hour     Equipment Recommendations  Rolling walker with 5" wheels;3in1 (PT)    Recommendations for Other Services       Precautions / Restrictions Precautions Precautions: Fall Precaution Comments: ck BP, but is transfused Restrictions Weight Bearing Restrictions: No    Mobility  Bed Mobility Overal bed mobility: Modified Independent             General bed mobility comments: sitting side of bed when PT arrived  Transfers Overall transfer level: Needs assistance Equipment used: None Transfers: Sit to/from [employer] Sit to Stand: Supervision Stand pivot transfers: Supervision          Ambulation/Gait Ambulation/Gait assistance: Min guard(for safety) Gait Distance (Feet): 120 Feet Assistive device: Rolling walker (2 wheeled) Gait Pattern/deviations: Step-through pattern;Decreased stride length;Wide base of support Gait velocity: reduced Gait velocity interpretation: <1.31 ft/sec, indicative of household  ambulator General Gait Details: min guard strictly due to recent hypotension   Stairs             Wheelchair Mobility    Modified Rankin (Stroke Patients Only)       Balance Overall balance assessment: Needs assistance Sitting-balance support: Feet supported Sitting balance-Leahy Scale: Good     Standing balance support: Bilateral upper extremity supported;During functional activity Standing balance-Leahy Scale: Fair                              Cognition Arousal/Alertness: Awake/alert Behavior During Therapy: WFL for tasks assessed/performed Overall Cognitive Status: Within Functional Limits for tasks assessed                                        Exercises      General Comments General comments (skin integrity, edema, etc.): BP were:  sitting 126/88 and standing was 143/70, post gait on hallway was 119/65.  Pt maintained 98% sat, pulses up to 83.  Nursing was informed, pt not dizzy or light headed to walk      Pertinent Vitals/Pain Pain Assessment: No/denies pain    Home Living                      Prior Function            PT Goals (current goals can now be found in the care plan section) Acute Rehab PT Goals Patient  Stated Goal: Pt agreeable to working with therapy Progress towards PT goals: Progressing toward goals    Frequency    Min 3X/week      PT Plan Current plan remains appropriate    Co-evaluation              AM-PAC PT "6 Clicks" Mobility   Outcome Measure  Help needed turning from your back to your side while in a flat bed without using bedrails?: None Help needed moving from lying on your back to sitting on the side of a flat bed without using bedrails?: None Help needed moving to and from a bed to a chair (including a wheelchair)?: A Little Help needed standing up from a chair using your arms (e.g., wheelchair or bedside chair)?: A Little Help needed to walk in hospital room?: A  Little Help needed climbing 3-5 steps with a railing? : A Lot 6 Click Score: 19    End of Session Equipment Utilized During Treatment: Gait belt Activity Tolerance: Patient tolerated treatment well Patient left: with call bell/phone within reach;in bed;Other (comment)(sitting on side of bed) Nurse Communication: Mobility status PT Visit Diagnosis: Unsteadiness on feet (R26.81);Other abnormalities of gait and mobility (R26.89);Muscle weakness (generalized) (M62.81);Difficulty in walking, not elsewhere classified (R26.2)     Time: [id number] PT Time Calculation (min) (ACUTE ONLY): 28 min  Charges:  $Gait Training: 8-22 mins $Therapeutic Activity: 8-22 mins                    Ramond Dial 04/08/2019, 4:32 PM  Mee Hives, PT MS Acute Rehab Dept. Number: [place] and [place]

## 2019-04-08 NOTE — Progress Notes (Addendum)
[facility] Gastroenterology Progress Note  Gavin Arroyo 64 y.o. 1955-01-18 Patient with alcoholic cirrhosis and recent GI bleeding.  Subjective: Patient states he is feeling "all right" today.  He had one brown bowel movement yesterday but has not had any bowel movements or rectal bleeding since that time.  He endorses feeling hungry but has no abdominal pain or any other complaints.  RN confirmed that patient has not had any rectal bleeding or bloody bowel movements today. Patient seen by me at 4 PM with no further bleeding and we discussed no aspirin or nonsteroidals at home and no more than 4 Tylenol and quitting drinking and we discussed his colonoscopy answered all of his questions in case discussed with the hospital team today as well Objective: Vital signs: Vitals:   04/08/19 0430 04/08/19 0840  BP: (!) 114/59 103/60  Pulse: 69 73  Resp: 18 11  Temp: 98 F (36.7 C) 98.3 F (36.8 C)  SpO2: 97% 99%    Physical Exam: Gen: alert, no acute distress  HEENT: anicteric sclera CV: RRR Chest: CTA B Abd: Soft, nontender, nondistended.  Large ventral hernia present. Ext: no edema  Lab Results: Recent Labs    04/07/19 0025 04/08/19 0215 04/08/19 0600  NA 134* 134*  --   K 3.9 3.3*  --   CL 108 109  --   CO2 19* 17*  --   GLUCOSE 107* 98  --   BUN 12 8  --   CREATININE 0.81 0.85  --   CALCIUM 7.3* 7.2*  --   MG  --   --  1.8  PHOS  --   --  2.5   Recent Labs    04/07/19 0025 04/08/19 0215  AST 57* 46*  ALT 29 29  ALKPHOS 62 64  BILITOT 1.3* 1.4*  PROT 4.1* 4.4*  ALBUMIN 1.9* 1.9*   Recent Labs    04/07/19 0025 04/07/19 0701 04/08/19 0215 04/08/19 0600  WBC 9.7  --  8.7  --   HGB 7.0*   < > 9.0* 8.8*  HCT 21.3*   < > 27.1* 27.2*  MCV 91.8  --  91.9  --   PLT 181  --  234  --    < > = values in this interval not displayed.      Assessment/Plan: Patient with alcoholic cirrhosis and recent GI bleeding.  He underwent colonoscopy yesterday which revealed  blood in the entire examined colon but no active bleeding seen.  Continue clear liquid diet until bleeding is no longer present.  Continue PPI, Carafate, octreotide, and propranolol.  Await bleeding scan results.  Which were negative and discussed with patient and if no signs of bleeding overnight can advance diet tomorrow and slowly advance to p.o. medicines and hopefully can go home soon and we discussed no alcohol aspirin nonsteroidals and no more than 4 Tylenol a day and will need close outpatient follow-up as well    Salley Slaughter 04/08/2019, 1:02 PM  Questions please call [phone]

## 2019-04-09 DIAGNOSIS — K7200 Acute and subacute hepatic failure without coma: Secondary | ICD-10-CM

## 2019-04-09 LAB — CBC
HCT: 23.9 % — ABNORMAL LOW (ref 39.0–52.0)
Hemoglobin: 7.7 g/dL — ABNORMAL LOW (ref 13.0–17.0)
MCH: 30.2 pg (ref 26.0–34.0)
MCHC: 32.2 g/dL (ref 30.0–36.0)
MCV: 93.7 fL (ref 80.0–100.0)
Platelets: 228 10*3/uL (ref 150–400)
RBC: 2.55 MIL/uL — ABNORMAL LOW (ref 4.22–5.81)
RDW: 16.6 % — ABNORMAL HIGH (ref 11.5–15.5)
WBC: 6.9 10*3/uL (ref 4.0–10.5)
nRBC: 0 % (ref 0.0–0.2)

## 2019-04-09 LAB — COMPREHENSIVE METABOLIC PANEL
ALT: 29 U/L (ref 0–44)
AST: 47 U/L — ABNORMAL HIGH (ref 15–41)
Albumin: 1.6 g/dL — ABNORMAL LOW (ref 3.5–5.0)
Alkaline Phosphatase: 65 U/L (ref 38–126)
Anion gap: 7 (ref 5–15)
BUN: 7 mg/dL — ABNORMAL LOW (ref 8–23)
CO2: 20 mmol/L — ABNORMAL LOW (ref 22–32)
Calcium: 7.4 mg/dL — ABNORMAL LOW (ref 8.9–10.3)
Chloride: 109 mmol/L (ref 98–111)
Creatinine, Ser: 0.79 mg/dL (ref 0.61–1.24)
GFR calc Af Amer: >60 mL/min (ref >60)
GFR calc non Af Amer: >60 mL/min (ref >60)
Glucose, Bld: 108 mg/dL — ABNORMAL HIGH (ref 70–99)
Potassium: 4.7 mmol/L (ref 3.5–5.1)
Sodium: 136 mmol/L (ref 135–145)
Total Bilirubin: 1.1 mg/dL (ref 0.3–1.2)
Total Protein: 4.1 g/dL — ABNORMAL LOW (ref 6.5–8.1)

## 2019-04-09 LAB — PROTIME-INR
INR: 1.4 — ABNORMAL HIGH (ref 0.8–1.2)
Prothrombin Time: 16.9 seconds — ABNORMAL HIGH (ref 11.4–15.2)

## 2019-04-09 MED ORDER — THIAMINE HCL 100 MG PO TABS
100.0000 mg | ORAL_TABLET | Freq: Every day | ORAL | Status: DC
  Administered 2019-04-10 – 2019-04-17 (×8): 100 mg via ORAL
  Filled 2019-04-09 (×9): qty 1

## 2019-04-09 NOTE — Progress Notes (Signed)
Gavin Arroyo 1:24 PM  Subjective: Patient is doing much better without signs of obvious bleeding and tolerating a regular diet and he says his stools are brown and no longer black  Objective: Vital signs stable afebrile no acute distress abdomen is soft nontender ventral hernia unchanged BUN and creatinine okay hemoglobin slight drop  Assessment: Multiple medical problems including cirrhosis with subacute GI blood loss  Plan: If no signs of bleeding overnight hopefully can go home soon with outpatient GI follow-up  [facility]  office [phone] After 5PM or if no answer call [phone]

## 2019-04-09 NOTE — Progress Notes (Signed)
Patient ID: Gavin Arroyo, male   DOB: 07/03/1955, 64 y.o.   MRN: [id number]   Subjective: HD: 10   Patient examined at bedside this morning. He notes that he is hungry this morning since he has only been on clear liquids this morning. He notes having bowel movement without any blood. He denies any abdominal pain, shortness of breath or dizziness.  Patient reports that he was told he could go home today. Discussed need to stay in hospital.    Objective:  Vital signs in last 24 hours: Vitals:   04/08/19 2119 04/09/19 0020 04/09/19 0022 04/09/19 0348  BP: 111/66  (!) 101/57 (!) 90/52  Pulse: 81 63 64 61  Resp: 18 14 12 16   Temp:   98.5 F (36.9 C) 98.2 F (36.8 C)  TempSrc:   Oral Oral  SpO2: (!) 83% 100% 97% 98%  Weight:    68.5 kg  Height:       Weight change: 4.996 kg  Intake/Output Summary (Last 24 hours) at 04/09/2019 0642 Last data filed at 04/09/2019 0300 Gross per 24 hour  Intake 1780.04 ml  Output 300 ml  Net 1480.04 ml   Physical Exam  Cardiovascular: Normal rate, regular rhythm, normal heart sounds and intact distal pulses.  Pulmonary/Chest: Effort normal and breath sounds normal.  Abdominal: Bowel sounds are normal. He exhibits distension, fluid wave and ascites. There is no abdominal tenderness. A hernia is present. Hernia confirmed positive in the ventral area.  Neurological: He is alert.  Skin: Skin is warm and dry.    CBC Latest Ref Rng & Units 04/09/2019 04/08/2019 04/08/2019  WBC 4.0 - 10.5 K/uL 6.9 - 8.7  Hemoglobin 13.0 - 17.0 g/dL 7.7(L) 8.8(L) 9.0(L)  Hematocrit 39.0 - 52.0 % 23.9(L) 27.2(L) 27.1(L)  Platelets 150 - 400 K/uL 228 - 234   CMP Latest Ref Rng & Units 04/09/2019 04/08/2019 04/07/2019  Glucose 70 - 99 mg/dL 108(H) 98 107(H)  BUN 8 - 23 mg/dL 7(L) 8 12  Creatinine 0.61 - 1.24 mg/dL 0.79 0.85 0.81  Sodium 135 - 145 mmol/L 136 134(L) 134(L)  Potassium 3.5 - 5.1 mmol/L 4.7 3.3(L) 3.9  Chloride 98 - 111 mmol/L 109 109 108  CO2 22 - 32 mmol/L 20(L)  17(L) 19(L)  Calcium 8.9 - 10.3 mg/dL 7.4(L) 7.2(L) 7.3(L)  Total Protein 6.5 - 8.1 g/dL 4.1(L) 4.4(L) 4.1(L)  Total Bilirubin 0.3 - 1.2 mg/dL 1.1 1.4(H) 1.3(H)  Alkaline Phos 38 - 126 U/L 65 64 62  AST 15 - 41 U/L 47(H) 46(H) 57(H)  ALT 0 - 44 U/L 29 29 29    INR:  04/01  1.4 03/31  1.3 03/30  1.3  Tagged RBC scan: No evidence of abnormal radiotracer activity in the abdomen or pelvis suggestive of intraluminal excretion. Normal red blood cell activity seen in liver, spleen and vessels. No scintigraphic evidence of active GI bleeding.  Assessment/Plan:  Principal Problem:   Acute GI bleeding Active Problems:   Symptomatic anemia   Hepatic cirrhosis (HCC)   Hepatic encephalopathy (HCC)  Summary:  Gavin Arroyo is a 64 year old male with a past medical history of cirrhosis and large ventral hernia admitted for acute hepatic encephalopathy in the setting of ativan that has since resolved and persistent GI bleed on hospital day 10.   Acute GI bleed, symptomatic anemia:  Tagged RBC scan was done yesterday with negative results, which is reassuring that he does not have a large or brisk bleed. The patient has also  had 2 bowel movements over the past day and a half that were brown in color without any signs of blood. All of this is reassuring that his GI bleeding has either slowed or stopped. However, the patient's Hgb continues to trend slowly downwards and I am concerned he may need another transfusion by tomorrow morning given his clinical course thus far. It had been discussed with him yesterday that he may be able to be discharged today, however with is clinical course over the past month we will keep him for monitoring until he does not require transfusion for at least 48-72 hours.  - advance diet today  - continue Protonix - continue octreotide and propranolol - continue carafate - GI following; appreciate all recommendations   Hepatic cirrhosis and encephalopathy:  Encephalopathy  in setting of ativan administration has since resolved. There is fluid wave on exam today and he continues to have evidence of mild ascites without fever or abdominal pain. Will continue to monitor. He has had dieretics held intermittently over the past few days due to low MAP, which likely has contributed to his continued to ascites. Midodrine was added yesterday in setting of intermittent low MAP and ascites.   - continue spiranolactone and lasix - continue lactulose  - started midodrine 5 mg with meals  PT: 3x/week + rolling walker OT: 3x/week  Diet: clear liquids, advance diet as tolerated VTE [id number] CODE STATUS:Full code  Prior to Admission Living Arrangement:Home Anticipated Discharge Location:Home Barriers to [place] transfusion, ongoing medical therapy Dispo:To be determined    LOS: 10 days   Mikael Spray, Medical Student 04/09/2019, 6:42 AM

## 2019-04-10 DIAGNOSIS — Z7289 Other problems related to lifestyle: Secondary | ICD-10-CM

## 2019-04-10 DIAGNOSIS — I959 Hypotension, unspecified: Secondary | ICD-10-CM

## 2019-04-10 DIAGNOSIS — D500 Iron deficiency anemia secondary to blood loss (chronic): Secondary | ICD-10-CM

## 2019-04-10 LAB — CBC
HCT: 23.3 % — ABNORMAL LOW (ref 39.0–52.0)
Hemoglobin: 7.2 g/dL — ABNORMAL LOW (ref 13.0–17.0)
MCH: 29.5 pg (ref 26.0–34.0)
MCHC: 30.9 g/dL (ref 30.0–36.0)
MCV: 95.5 fL (ref 80.0–100.0)
Platelets: 235 10*3/uL (ref 150–400)
RBC: 2.44 MIL/uL — ABNORMAL LOW (ref 4.22–5.81)
RDW: 16.1 % — ABNORMAL HIGH (ref 11.5–15.5)
WBC: 6.9 10*3/uL (ref 4.0–10.5)
nRBC: 0 % (ref 0.0–0.2)

## 2019-04-10 LAB — FERRITIN: Ferritin: 34 ng/mL (ref 24–336)

## 2019-04-10 LAB — PROTIME-INR
INR: 1.3 — ABNORMAL HIGH (ref 0.8–1.2)
Prothrombin Time: 15.7 seconds — ABNORMAL HIGH (ref 11.4–15.2)

## 2019-04-10 LAB — COMPREHENSIVE METABOLIC PANEL
ALT: 50 U/L — ABNORMAL HIGH (ref 0–44)
AST: 97 U/L — ABNORMAL HIGH (ref 15–41)
Albumin: 1.6 g/dL — ABNORMAL LOW (ref 3.5–5.0)
Alkaline Phosphatase: 95 U/L (ref 38–126)
Anion gap: 8 (ref 5–15)
BUN: 7 mg/dL — ABNORMAL LOW (ref 8–23)
CO2: 20 mmol/L — ABNORMAL LOW (ref 22–32)
Calcium: 7.8 mg/dL — ABNORMAL LOW (ref 8.9–10.3)
Chloride: 107 mmol/L (ref 98–111)
Creatinine, Ser: 0.97 mg/dL (ref 0.61–1.24)
GFR calc Af Amer: >60 mL/min (ref >60)
GFR calc non Af Amer: >60 mL/min (ref >60)
Glucose, Bld: 99 mg/dL (ref 70–99)
Potassium: 3.7 mmol/L (ref 3.5–5.1)
Sodium: 135 mmol/L (ref 135–145)
Total Bilirubin: 0.4 mg/dL (ref 0.3–1.2)
Total Protein: 4.2 g/dL — ABNORMAL LOW (ref 6.5–8.1)

## 2019-04-10 LAB — IRON AND TIBC
Iron: 23 ug/dL — ABNORMAL LOW (ref 45–182)
Saturation Ratios: 8 % — ABNORMAL LOW (ref 17.9–39.5)
TIBC: 294 ug/dL (ref 250–450)
UIBC: 271 ug/dL

## 2019-04-10 MED ORDER — SODIUM CHLORIDE 0.9 % IV SOLN
25.0000 ug/h | INTRAVENOUS | Status: DC
  Administered 2019-04-11: 25 ug/h via INTRAVENOUS
  Filled 2019-04-10: qty 1

## 2019-04-10 MED ORDER — SODIUM CHLORIDE 0.9 % IV SOLN
510.0000 mg | Freq: Once | INTRAVENOUS | Status: AC
  Administered 2019-04-10: 510 mg via INTRAVENOUS
  Filled 2019-04-10: qty 17

## 2019-04-10 MED ORDER — PANTOPRAZOLE SODIUM 40 MG PO TBEC: 40.0000 mg | DELAYED_RELEASE_TABLET | Freq: Two times a day (BID) | ORAL | Status: DC

## 2019-04-10 MED ORDER — SODIUM CHLORIDE 0.9 % IV SOLN: 510.0000 mg | Freq: Once | INTRAVENOUS | Status: DC

## 2019-04-10 MED ORDER — PANTOPRAZOLE SODIUM 40 MG PO TBEC
40.0000 mg | DELAYED_RELEASE_TABLET | Freq: Two times a day (BID) | ORAL | Status: DC
  Administered 2019-04-10 – 2019-04-17 (×14): 40 mg via ORAL
  Filled 2019-04-10 (×16): qty 1

## 2019-04-10 NOTE — Progress Notes (Signed)
Gavin Arroyo 12:51 PM  Subjective: Patient doing well without signs of bleeding and tolerating regular food and wants to go home has community service next week as well as a court date and we again discussed no alcohol aspirin or nonsteroidals and no more than 4 Tylenol a day  Objective: Vital signs stable afebrile no acute distress abdomen is soft ventral hernia unchanged labs stable hemoglobin slight drop  Assessment: Cirrhosis with subacute GI blood loss questionable etiology  Plan: Will wean octreotide consider a dose of IV iron and may be 1 more transfusion close outpatient follow-up and see above for other recommendations and consider Inderal 20 twice daily once a day pump inhibitor twice a day Carafate and probably proceed with repeat endoscopy or capsule endoscopy if signs of bleeding continue as an outpatient  [facility]  office [phone] After 5PM or if no answer call [phone]

## 2019-04-10 NOTE — Progress Notes (Signed)
Occupational Therapy Treatment Patient Details Name: Gavin Arroyo MRN: [id number] DOB: 1955/03/15 Today's Date: 04/10/2019    History of present illness  64 y/o male, with a PMH of GI bleed, ventral hernia, and compensated liver cirrhosis, who presents to [facility] from [place] for a GI bleed. Per [facility], patient presented to [facility] he was found to have a hemoglobin of 5.8 with an elevated ammonia level of 87.   OT comments  Pt received in bed, initially frustrated as he hoped to be discharged home yesterday. Provided encouragement and education - pt agreeable to work with OT. Pt Modified Independent for bed mobility to sit EOB, Independent for sit to stand, and min guard progressing to supervision for short distance mobility in room without AD. Assessed BP in between transitional movements (see below), but unsure of accuracy as pt frequently talking/moving UE during readings. Pt denied dizziness as he stood for grooming tasks at sink. Provided education to pt on safety techniques to implement if low BP readings continue and/or if he feels dizzy with pt verbalizing understanding. Will continue to follow acutely.    Follow Up Recommendations  Home health OT;Supervision/Assistance - 24 hour    Equipment Recommendations  None recommended by OT    Recommendations for Other Services      Precautions / Restrictions Precautions Precautions: Fall Precaution Comments: check BP Restrictions Weight Bearing Restrictions: No       Mobility Bed Mobility Overal bed mobility: Modified Independent Bed Mobility: Supine to Sit;Sit to Supine              Transfers Overall transfer level: Needs assistance Equipment used: None Transfers: Sit to/from [facility] Sit to Stand: Independent Stand pivot transfers: Supervision       General transfer comment: Supervision to ensure safety without AD given low BPs    Balance                                            ADL either performed or assessed with clinical judgement   ADL Overall ADL's : Needs assistance/impaired     Grooming: Brushing hair;Supervision/safety;Standing Grooming Details (indicate cue type and reason): Supervision standing at sink without AD to ensure safety given low BPs                             Functional mobility during ADLs: Min guard;Supervision/safety General ADL Comments: min guard to supervision to ensure safety, maintained decent stability today     Vision       Perception     Praxis      Cognition Arousal/Alertness: Awake/alert Behavior During Therapy: WFL for tasks assessed/performed Overall Cognitive Status: Within Functional Limits for tasks assessed                                 General Comments: Pt a bit frustrated not being discharged yesterday, but not outright agitated. still polite with therapist         Exercises     Shoulder Instructions       General Comments BP at rest 100/56, 106/93 sitting EOB but was moving arm during reading, 120/50 after standing activity but moving/talking     Pertinent Vitals/ Pain       Pain Assessment: No/denies pain  Home Living                                          Prior Functioning/Environment              Frequency  Min 3X/week        Progress Toward Goals  OT Goals(current goals can now be found in the care plan section)  Progress towards OT goals: Progressing toward goals  Acute Rehab OT Goals Patient Stated Goal: to be discharged prior to court date Time For Goal Achievement: 04/14/19 Potential to Achieve Goals: Good ADL Goals Pt Will Perform Grooming: with modified independence;standing Pt Will Perform Lower Body Bathing: with modified independence;sit to/from stand Pt Will Perform Lower Body Dressing: with modified independence;sit to/from stand Pt Will Transfer to Toilet: with modified independence;ambulating;regular height  toilet Pt Will Perform Toileting - Clothing Manipulation and hygiene: with modified independence;sit to/from stand Pt Will Perform Tub/Shower Transfer: Tub transfer;with min guard assist;shower seat;grab bars  Plan Discharge plan remains appropriate    Co-evaluation                 AM-PAC OT "6 Clicks" Daily Activity     Outcome Measure   Help from another person eating meals?: None Help from another person taking care of personal grooming?: A Little Help from another person toileting, which includes using toliet, bedpan, or urinal?: A Little Help from another person bathing (including washing, rinsing, drying)?: A Little Help from another person to put on and taking off regular upper body clothing?: None Help from another person to put on and taking off regular lower body clothing?: A Little 6 Click Score: 20    End of Session    OT Visit Diagnosis: Unsteadiness on feet (R26.81);Muscle weakness (generalized) (M62.81)   Activity Tolerance Patient tolerated treatment well   Patient Left in bed;with call bell/phone within reach   Nurse Communication Mobility status;Other (comment)(request to speak to MD)        Time: [id number] OT Time Calculation (min): 20 min  Charges: OT General Charges $OT Visit: 1 Visit OT Treatments $Self Care/Home Management : 8-22 mins  Layla Maw, OTR/L   Layla Maw 04/10/2019, 11:36 AM

## 2019-04-10 NOTE — Progress Notes (Signed)
Spoke To MD Sheppard Coil, and OK to hold Lasix, spirolactone, and propanolol. He will place parameter for these medications.

## 2019-04-10 NOTE — Progress Notes (Signed)
CSW received consult regarding patient having a court date in [place] next week and requiring CSW assistance notifying them. CSW went to speak with patient, however, patient soundly sleeping and would not awaken. Courthouse is closed until Monday due to holiday so CSW unable to obtain fax number. Patient will need to provide a fax number (of his parole officer or lawyer) to send documentation to; please contact CSW if patient is able to do so once awake.   Gavin Locus Savi Lastinger LCSW [phone]

## 2019-04-10 NOTE — Progress Notes (Signed)
Physical Therapy Treatment Patient Details Name: Gavin Arroyo MRN: [id number] DOB: 07/01/1955 Today's Date: 04/10/2019    History of Present Illness  64 y/o male, with a PMH of GI bleed, ventral hernia, and compensated liver cirrhosis, who presents to [facility] from [place] for a GI bleed. Per [facility], patient presented to [facility] he was found to have a hemoglobin of 5.8 with an elevated ammonia level of 87.    PT Comments    PT demonstrating good progress today.  He scored a 23 on DGI which indicated low fall risk.  Pt ambulated 400' safely without AD - reports he feels at baseline.  Blood pressure was soft upper 90's/60's but stable with transfers and pt asymptomatic.   Pt no longer needs skilled PT services.  Recommend ambulation with nursing to maintain mobility.   Follow Up Recommendations  No PT follow up     Equipment Recommendations  None recommended by PT    Recommendations for Other Services       Precautions / Restrictions Precautions Precautions: None Precaution Comments: check BP Restrictions Weight Bearing Restrictions: No    Mobility  Bed Mobility Overal bed mobility: Independent Bed Mobility: Supine to Sit;Sit to Supine              Transfers Overall transfer level: Needs assistance Equipment used: None Transfers: Sit to/from Stand Sit to Stand: Independent Stand pivot transfers: Supervision       General transfer comment: Supervision to ensure safety without AD given low BPs  Ambulation/Gait Ambulation/Gait assistance: Independent Gait Distance (Feet): 400 Feet Assistive device: None       General Gait Details: normal gait; no LOB; pt has been ambulating in room independently   Stairs             Wheelchair Mobility    Modified Rankin (Stroke Patients Only)       Balance Overall balance assessment: Independent   Sitting balance-Leahy Scale: Normal       Standing balance-Leahy Scale: Normal                    Standardized Balance Assessment Standardized Balance Assessment : Dynamic Gait Index   Dynamic Gait Index Level Surface: Normal Change in Gait Speed: Normal Gait with Horizontal Head Turns: Normal Gait with Vertical Head Turns: Normal Gait and Pivot Turn: Normal Step Over Obstacle: Normal Step Around Obstacles: Normal Steps: Mild Impairment(unable to get to steps (stairs near room are emergency only); performed toe taps on trashcan alternating) Total Score: 23      Cognition Arousal/Alertness: Awake/alert Behavior During Therapy: WFL for tasks assessed/performed Overall Cognitive Status: Within Functional Limits for tasks assessed                                 General Comments: Pt a bit frustrated not being discharged yesterday, but not outright agitated. still polite with therapist       Exercises      General Comments General comments (skin integrity, edema, etc.): BPs upper 90's/60's throughout treatment pt asymptomatic.  On RA, HR and O2 sats stable.  Pt reports feels at baseline mobility      Pertinent Vitals/Pain Pain Assessment: No/denies pain    Home Living                      Prior Function            PT  Goals (current goals can now be found in the care plan section) Acute Rehab PT Goals Patient Stated Goal: to be discharged prior to court date Progress towards PT goals: Goals met/education completed, patient discharged from PT    Frequency           PT Plan Other (comment)(will d/c PT; goals met or nearly met)    Co-evaluation              AM-PAC PT "6 Clicks" Mobility   Outcome Measure  Help needed turning from your back to your side while in a flat bed without using bedrails?: None Help needed moving from lying on your back to sitting on the side of a flat bed without using bedrails?: None Help needed moving to and from a bed to a chair (including a wheelchair)?: None Help needed standing up from a chair  using your arms (e.g., wheelchair or bedside chair)?: None Help needed to walk in hospital room?: None Help needed climbing 3-5 steps with a railing? : None 6 Click Score: 24    End of Session   Activity Tolerance: Patient tolerated treatment well Patient left: in bed;with call bell/phone within reach Nurse Communication: Mobility status(PT to sign off)       Time: 1400-1409 PT Time Calculation (min) (ACUTE ONLY): 9 min  Charges:  $Gait Training: 8-22 mins                     Maggie Font, PT Acute Rehab Services Pager [phone] [facility] [phone] [facility] 04/10/2019, 2:17 PM

## 2019-04-10 NOTE — Progress Notes (Addendum)
Subjective:   Pt was seen at the bedside today. He notes feeling well this morning. He is able to tolerate his diet. He notes a bowel movement that was initially black but then progressed to "strawberry" color and then normal brown stools.  He has had normal stools for the last couple days.  He states that he understands his hemoglobin is continuing to downtrend and may need a transfusion.  No other complaints at this time.  Objective: CBC Latest Ref Rng & Units 04/10/2019 04/09/2019 04/08/2019  WBC 4.0 - 10.5 K/uL 6.9 6.9 -  Hemoglobin 13.0 - 17.0 g/dL 7.2(L) 7.7(L) 8.8(L)  Hematocrit 39.0 - 52.0 % 23.3(L) 23.9(L) 27.2(L)  Platelets 150 - 400 K/uL 235 228 -   BMP Latest Ref Rng & Units 04/10/2019 04/09/2019 04/08/2019  Glucose 70 - 99 mg/dL 99 108(H) 98  BUN 8 - 23 mg/dL 7(L) 7(L) 8  Creatinine 0.61 - 1.24 mg/dL 0.97 0.79 0.85  Sodium 135 - 145 mmol/L 135 136 134(L)  Potassium 3.5 - 5.1 mmol/L 3.7 4.7 3.3(L)  Chloride 98 - 111 mmol/L 107 109 109  CO2 22 - 32 mmol/L 20(L) 20(L) 17(L)  Calcium 8.9 - 10.3 mg/dL 7.8(L) 7.4(L) 7.2(L)   Vital signs in last 24 hours: Vitals:   04/10/19 0000 04/10/19 0407 04/10/19 0409 04/10/19 0550  BP: (!) 97/55 (!) 99/58    Pulse: 65 66    Resp: 15 13 18    Temp: 98 F (36.7 C) 98.3 F (36.8 C)    TempSrc: Oral Oral    SpO2: 100% 98%    Weight:    73.1 kg  Height:       Physical Exam General: Thin appearing, NAD resting in bed  HEENT: NCAT CV: S1-S2, no murmurs rubs or gallops appreciated PULM: Clear to auscultation bilaterally, no crackles or wheezes appreciated ABD: Large ventral hernia that is soft and reducible.  No tenderness to palpation in all quadrants NEURO: Alert and oriented, no focal deficits  Assessment/Plan:  Principal Problem:   Acute GI bleeding Active Problems:   Symptomatic anemia   Hepatic cirrhosis (HCC)   Hepatic encephalopathy (HCC)  In summary, Gavin Arroyo is a 64 year old male with a history of alcohol use disorder,  cirrhosis, and a large ventral hernia who was admitted for acute hepatic encephalopathy in the setting of receiving Ativan which subsequently resolved.  He is also had a persistent GI bleed for which he has been hospitalized for multiple times in the past month.  He is received over 28 blood transfusions in that time.  #GI bleed #Cirrhosis: Patient's had recurrent GI bleeds with multiple endoscopies and colonoscopies over the past month.  GI is involved with the patient's care. The patient received a tagged RBC scan which was negative. 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  The patient has had multiple normal bowel movements over the past couple days, which is reassuring.  However, his Hgb continues to downtrend daily and I suspect he will need another transfusion. -GI following, appreciate recommendations -IV octreotide 25 mcg/hr continuous -Spironolactone 100 mg daily -Propranolol 100 mg twice daily -PO Lasix 40 mg daily -Lactulose 30 mg TID -Monitor Hgb with daily CBC  #Anemia: Secondary to chronic blood loss. Iron studies demonstrate iron deficiency with ferritin of 34 and iron of 23. -Feraheme dose ordered  #Hypotension: Patient's BPs are chronically soft in the low 90s. -Midodrin 5 mg TID -Hold Propranolol if  BP <90  #FEN/GI -Diet: Advance to 2 g sodium -Fluids: None -Protonix 40 mg daily -Phenergan 12.5 mg tablet q6hrs PRN for nausea -Senna tablet daily PRN -Sucralfate 1g TID w/ meals -Multivitamin daily  -Feeding supplaments TID between meals  #DVT prophylaxis -SCDs  #CODE STATUS: Full  #Dispo: Anticipated discharge pending medical course. Prior to Admission Living Arrangement: Home Anticipated Discharge Location: Home w/ [facility] PT/OT Barriers to Discharge: Ongoing medical work-up  Earlene Plater, MD Internal Medicine, PGY1 Pager: [phone]  04/10/2019,6:59 AM

## 2019-04-11 ENCOUNTER — Encounter (HOSPITAL_COMMUNITY)
Admission: EM | Disposition: A | Discharge status: Other Acute Inpatient Hospital | Payer: Self-pay | Source: Other Acute Inpatient Hospital | Attending: Student in an Organized Health Care Education/Training Program

## 2019-04-11 LAB — COMPREHENSIVE METABOLIC PANEL
ALT: 40 U/L (ref 0–44)
AST: 64 U/L — ABNORMAL HIGH (ref 15–41)
Albumin: 1.5 g/dL — ABNORMAL LOW (ref 3.5–5.0)
Alkaline Phosphatase: 78 U/L (ref 38–126)
Anion gap: 7 (ref 5–15)
BUN: 8 mg/dL (ref 8–23)
CO2: 22 mmol/L (ref 22–32)
Calcium: 7.9 mg/dL — ABNORMAL LOW (ref 8.9–10.3)
Chloride: 107 mmol/L (ref 98–111)
Creatinine, Ser: 1.08 mg/dL (ref 0.61–1.24)
GFR calc Af Amer: >60 mL/min (ref >60)
GFR calc non Af Amer: >60 mL/min (ref >60)
Glucose, Bld: 118 mg/dL — ABNORMAL HIGH (ref 70–99)
Potassium: 3.9 mmol/L (ref 3.5–5.1)
Sodium: 136 mmol/L (ref 135–145)
Total Bilirubin: 0.8 mg/dL (ref 0.3–1.2)
Total Protein: 4 g/dL — ABNORMAL LOW (ref 6.5–8.1)

## 2019-04-11 LAB — PREPARE RBC (CROSSMATCH)

## 2019-04-11 LAB — CBC
HCT: 18.3 % — ABNORMAL LOW (ref 39.0–52.0)
Hemoglobin: 5.7 g/dL — CL (ref 13.0–17.0)
MCH: 29.5 pg (ref 26.0–34.0)
MCHC: 31.1 g/dL (ref 30.0–36.0)
MCV: 94.8 fL (ref 80.0–100.0)
Platelets: 202 10*3/uL (ref 150–400)
RBC: 1.93 MIL/uL — ABNORMAL LOW (ref 4.22–5.81)
RDW: 15.8 % — ABNORMAL HIGH (ref 11.5–15.5)
WBC: 6.8 10*3/uL (ref 4.0–10.5)
nRBC: 0 % (ref 0.0–0.2)

## 2019-04-11 LAB — PROTIME-INR
INR: 1.4 — ABNORMAL HIGH (ref 0.8–1.2)
Prothrombin Time: 16.6 seconds — ABNORMAL HIGH (ref 11.4–15.2)

## 2019-04-11 LAB — HEMOGLOBIN AND HEMATOCRIT, BLOOD
HCT: 20.4 % — ABNORMAL LOW (ref 39.0–52.0)
Hemoglobin: 6.8 g/dL — CL (ref 13.0–17.0)

## 2019-04-11 SURGERY — EGD (ESOPHAGOGASTRODUODENOSCOPY)
Anesthesia: General

## 2019-04-11 SURGERY — EGD (ESOPHAGOGASTRODUODENOSCOPY)
Anesthesia: Monitor Anesthesia Care

## 2019-04-11 MED ORDER — SODIUM CHLORIDE 0.9% IV SOLUTION: Freq: Once | INTRAVENOUS | Status: AC

## 2019-04-11 NOTE — Progress Notes (Signed)
Gavin Arroyo 10:32 AM  Subjective: Patient without signs of bleeding but dropped his hemoglobin again without change in BUN and no blood in his stool no new complaints and his case discussed multiple times with the hospital team  Objective: Vital signs stable afebrile no acute distress exam unchanged labs pertinent for decreased hemoglobin normal BUN as above  Assessment: Subacute GI blood loss probably related to portal hypertension questionable etiology  Plan: Unfortunately he had eaten breakfast so tomorrow we will proceed with endoscopy and if nondiagnostic capsule endoscopy and the risks of that procedure particularly with his ventral hernia was discussed we answered all of his questions  [facility] E  office [phone] After 5PM or if no answer call [phone]

## 2019-04-11 NOTE — Progress Notes (Signed)
Subjective:   Pt was seen at the bedside today. Pt is understandably frustrated that his Hgb dropped to 5.7. He states there was a family easter egg hunt that he will not be able to attend because of this.  Otherwise, the patient feels fine and denies any SOB, dizziness, melena or blood in his stools.  He states they are brown.  He denies any nausea or vomiting or abdominal pain at this time.  Overall he feels he is at his baseline.   Objective: CBC Latest Ref Rng & Units 04/11/2019 04/10/2019 04/09/2019  WBC 4.0 - 10.5 K/uL 6.8 6.9 6.9  Hemoglobin 13.0 - 17.0 g/dL 5.7(LL) 7.2(L) 7.7(L)  Hematocrit 39.0 - 52.0 % 18.3(L) 23.3(L) 23.9(L)  Platelets 150 - 400 K/uL 202 235 228   BMP Latest Ref Rng & Units 04/11/2019 04/10/2019 04/09/2019  Glucose 70 - 99 mg/dL 118(H) 99 108(H)  BUN 8 - 23 mg/dL 8 7(L) 7(L)  Creatinine 0.61 - 1.24 mg/dL 1.08 0.97 0.79  Sodium 135 - 145 mmol/L 136 135 136  Potassium 3.5 - 5.1 mmol/L 3.9 3.7 4.7  Chloride 98 - 111 mmol/L 107 107 109  CO2 22 - 32 mmol/L 22 20(L) 20(L)  Calcium 8.9 - 10.3 mg/dL 7.9(L) 7.8(L) 7.4(L)   Vital signs in last 24 hours: Vitals:   04/10/19 2138 04/10/19 2139 04/11/19 0114 04/11/19 0539  BP: (!) 101/53 (!) 101/53 (!) 104/55 (!) 99/51  Pulse:   78 72  Resp:   16 18  Temp: 98 F (36.7 C)  98.2 F (36.8 C) 98.1 F (36.7 C)  TempSrc: Oral  Oral Oral  SpO2: 99%  98% 97%  Weight:      Height:       Physical Exam General: Resting in bed comfortably HEENT: NCAT CV: S1-S2, no murmurs rubs or gallops appreciated PULM: Clear to auscultation bilaterally, no crackles or wheezes appreciated ABD: Large ventral hernia that is soft and reducible.  No tenderness to palpation in all quadrants NEURO: Alert and oriented, no focal deficits  Assessment/Plan:  Principal Problem:   Acute GI bleeding Active Problems:   Symptomatic anemia   Hepatic cirrhosis (HCC)   Hepatic encephalopathy (HCC)  In summary, Mr. Nunnery is a 64 year old male with a  history of alcohol use disorder, cirrhosis, and a large ventral hernia who was admitted for acute hepatic encephalopathy in the setting of receiving Ativan which subsequently resolved.  He is also had a persistent GI bleed for which he has been hospitalized for multiple times in the past month.  He has received over 30 transfusions over the past month.  #GI bleed #Cirrhosis: Patient's had recurrent GI bleeds with multiple endoscopies and colonoscopies over the past month.  GI is involved with the patient's care. The patient received multiple endoscopies, colonoscopies and also a tagged RBC scan which was negative. 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 The patient continues to have normal bowel movements over the past couple days, which is reassuring.  However, his Hgb continues to downtrend daily.  His Hgb today was 5.7. -1 unit pRBC ordered -GI following, appreciate recommendations. Will reach out to discuss possibility of capsule endoscopy in the inpatient setting in the context of Hgb at 5.7.  -IV octreotide 12.5 mcg/hr continuous -Spironolactone 100 mg daily -Propranolol 100 mg twice daily -PO Lasix 40 mg daily -Lactulose 30 mg TID -Monitor Hgb with daily CBC  #Anemia: Hgb 5.7 today.  Secondary to chronic blood loss. Iron studies demonstrate iron deficiency with ferritin of 34 and iron of 23.  Status post 1 dose of Feraheme on 4/2.  #Hypotension: Patient's BPs are chronically soft in the low 90s. -Midodrin 5 mg TID -Hold Propranolol if BP <90  #FEN/GI -Diet: 2 g sodium -Fluids: None -Protonix 40 mg daily -Phenergan 12.5 mg tablet q6hrs PRN for nausea -Senna tablet daily PRN -Sucralfate 1g TID w/ meals -Multivitamin daily  -Feeding supplaments TID between meals  #DVT prophylaxis -SCDs  #CODE STATUS: FULL  #Dispo: Anticipated discharge pending medical course. Prior to Admission Living Arrangement:  Home Anticipated Discharge Location: Home w/ [facility] PT/OT Barriers to Discharge: Ongoing medical work-up  Earlene Plater, MD Internal Medicine, PGY1 Pager: [phone]  04/11/2019,8:03 AM

## 2019-04-12 ENCOUNTER — Inpatient Hospital Stay (HOSPITAL_COMMUNITY): Payer: Self-pay | Admitting: Anesthesiology

## 2019-04-12 ENCOUNTER — Encounter (HOSPITAL_COMMUNITY)
Admission: EM | Disposition: A | Discharge status: Other Acute Inpatient Hospital | Payer: Self-pay | Source: Other Acute Inpatient Hospital | Attending: Student in an Organized Health Care Education/Training Program

## 2019-04-12 ENCOUNTER — Encounter (HOSPITAL_COMMUNITY): Payer: Self-pay | Admitting: Student in an Organized Health Care Education/Training Program

## 2019-04-12 HISTORY — PX: GIVENS CAPSULE STUDY: SHX5432

## 2019-04-12 HISTORY — PX: ESOPHAGOGASTRODUODENOSCOPY (EGD) WITH PROPOFOL: SHX5813

## 2019-04-12 LAB — PROTIME-INR
INR: 1.4 — ABNORMAL HIGH (ref 0.8–1.2)
Prothrombin Time: 17 seconds — ABNORMAL HIGH (ref 11.4–15.2)

## 2019-04-12 LAB — CBC
HCT: 23.9 % — ABNORMAL LOW (ref 39.0–52.0)
Hemoglobin: 8 g/dL — ABNORMAL LOW (ref 13.0–17.0)
MCH: 30.4 pg (ref 26.0–34.0)
MCHC: 33.5 g/dL (ref 30.0–36.0)
MCV: 90.9 fL (ref 80.0–100.0)
Platelets: 189 10*3/uL (ref 150–400)
RBC: 2.63 MIL/uL — ABNORMAL LOW (ref 4.22–5.81)
RDW: 15.1 % (ref 11.5–15.5)
WBC: 8.3 10*3/uL (ref 4.0–10.5)
nRBC: 0 % (ref 0.0–0.2)

## 2019-04-12 LAB — COMPREHENSIVE METABOLIC PANEL
ALT: 34 U/L (ref 0–44)
AST: 51 U/L — ABNORMAL HIGH (ref 15–41)
Albumin: 1.5 g/dL — ABNORMAL LOW (ref 3.5–5.0)
Alkaline Phosphatase: 74 U/L (ref 38–126)
Anion gap: 7 (ref 5–15)
BUN: 9 mg/dL (ref 8–23)
CO2: 21 mmol/L — ABNORMAL LOW (ref 22–32)
Calcium: 7.7 mg/dL — ABNORMAL LOW (ref 8.9–10.3)
Chloride: 108 mmol/L (ref 98–111)
Creatinine, Ser: 0.79 mg/dL (ref 0.61–1.24)
GFR calc Af Amer: >60 mL/min (ref >60)
GFR calc non Af Amer: >60 mL/min (ref >60)
Glucose, Bld: 128 mg/dL — ABNORMAL HIGH (ref 70–99)
Potassium: 3.6 mmol/L (ref 3.5–5.1)
Sodium: 136 mmol/L (ref 135–145)
Total Bilirubin: 0.9 mg/dL (ref 0.3–1.2)
Total Protein: 4.1 g/dL — ABNORMAL LOW (ref 6.5–8.1)

## 2019-04-12 SURGERY — ESOPHAGOGASTRODUODENOSCOPY (EGD) WITH PROPOFOL
Anesthesia: Monitor Anesthesia Care

## 2019-04-12 MED ORDER — LACTATED RINGERS IV SOLN: INTRAVENOUS | Status: DC

## 2019-04-12 MED ORDER — FENTANYL CITRATE (PF) 100 MCG/2ML IJ SOLN: 25.0000 ug | INTRAMUSCULAR | Status: DC | PRN

## 2019-04-12 MED ORDER — PHENYLEPHRINE 40 MCG/ML (10ML) SYRINGE FOR IV PUSH (FOR BLOOD PRESSURE SUPPORT)
PREFILLED_SYRINGE | INTRAVENOUS | Status: DC | PRN
  Administered 2019-04-12 (×3): 80 ug via INTRAVENOUS

## 2019-04-12 MED ORDER — ALBUMIN HUMAN 5 % IV SOLN: INTRAVENOUS | Status: DC | PRN

## 2019-04-12 MED ORDER — PROPOFOL 500 MG/50ML IV EMUL
INTRAVENOUS | Status: DC | PRN
  Administered 2019-04-12: 100 ug/kg/min via INTRAVENOUS

## 2019-04-12 MED ORDER — ONDANSETRON HCL 4 MG/2ML IJ SOLN: 4.0000 mg | Freq: Once | INTRAMUSCULAR | Status: DC | PRN

## 2019-04-12 MED ORDER — SODIUM CHLORIDE 0.9 % IV SOLN: INTRAVENOUS | Status: DC

## 2019-04-12 MED ORDER — PROPOFOL 10 MG/ML IV BOLUS
INTRAVENOUS | Status: DC | PRN
  Administered 2019-04-12 (×2): 20 mg via INTRAVENOUS

## 2019-04-12 SURGICAL SUPPLY — 15 items

## 2019-04-12 NOTE — Transfer of Care (Signed)
Immediate Anesthesia Transfer of Care Note  Patient: Gavin Arroyo  Procedure(s) Performed: ESOPHAGOGASTRODUODENOSCOPY (EGD) WITH PROPOFOL (N/A ) GIVENS CAPSULE STUDY (N/A )  Patient Location: Endoscopy Unit  Anesthesia Type:MAC  Level of Consciousness: awake, alert  and oriented  Airway & Oxygen Therapy: Patient Spontanous Breathing and Patient connected to nasal cannula oxygen  Post-op Assessment: Report given to RN and Post -op Vital signs reviewed and stable  Post vital signs: Reviewed and stable  Last Vitals:  Vitals Value Taken Time  BP 88/46 04/12/19 0816  Temp    Pulse 58 04/12/19 0818  Resp 13 04/12/19 0818  SpO2 100 % 04/12/19 0818  Vitals shown include unvalidated device data.  Last Pain:  Vitals:   04/12/19 0812  TempSrc:   PainSc: Asleep      Patients Stated Pain Goal: 0 (82/80/03 4917)  Complications: No apparent anesthesia complications

## 2019-04-12 NOTE — Progress Notes (Signed)
Pill cam deployed at 0800. Instructions reviewed with patient pre procedure. Paper instruction sheet sent to floor with pt.

## 2019-04-12 NOTE — Progress Notes (Signed)
Gavin Arroyo 7:37 AM  Subjective: Patient without any signs of bleeding and no new complaints and again we discussed the procedure including endoscopy and capsule occluding the risks  Objective: Vital signs stable afebrile no acute distress exam please see preassessment evaluation labs stable  Assessment: Subacute GI blood loss probably related to portal hypertension  Plan: Okay to proceed with endoscopy and possible capsule endoscopy with anesthesia assistance  [facility] E  office [phone] After 5PM or if no answer call [phone]

## 2019-04-12 NOTE — Anesthesia Postprocedure Evaluation (Signed)
Anesthesia Post Note  Patient: Gavin Arroyo  Procedure(s) Performed: ESOPHAGOGASTRODUODENOSCOPY (EGD) WITH PROPOFOL (N/A ) GIVENS CAPSULE STUDY (N/A )     Patient location during evaluation: Endoscopy Anesthesia Type: MAC Level of consciousness: awake and alert Pain management: pain level controlled Vital Signs Assessment: post-procedure vital signs reviewed and stable Respiratory status: spontaneous breathing, nonlabored ventilation, respiratory function stable and patient connected to nasal cannula oxygen Cardiovascular status: stable and blood pressure returned to baseline Postop Assessment: no apparent nausea or vomiting Anesthetic complications: no    Last Vitals:  Vitals:   04/12/19 1025 04/12/19 1212  BP: (!) 98/46 (!) 101/56  Pulse:    Resp:  16  Temp:  36.6 C  SpO2:      Last Pain:  Vitals:   04/12/19 0852  TempSrc: Oral  PainSc:                  Catalina Gravel

## 2019-04-12 NOTE — Anesthesia Preprocedure Evaluation (Addendum)
Anesthesia Evaluation  Patient identified by MRN, date of birth, ID band Patient awake    Reviewed: Allergy & Precautions, NPO status , Patient's Chart, lab work & pertinent test results  Airway Mallampati: II  TM Distance: >3 FB Neck ROM: Full    Dental  (+) Dental Advisory Given, Poor Dentition, Chipped   Pulmonary Current Smoker and Patient abstained from smoking.,    Pulmonary exam normal breath sounds clear to auscultation       Cardiovascular negative cardio ROS   Rhythm:Regular Rate:Bradycardia     Neuro/Psych negative neurological ROS     GI/Hepatic GERD  Medicated,(+)     substance abuse  alcohol use, GI blood loss   Endo/Other  negative endocrine ROS  Renal/GU negative Renal ROS     Musculoskeletal negative musculoskeletal ROS (+)   Abdominal   Peds  Hematology  (+) Blood dyscrasia, anemia ,   Anesthesia Other Findings Day of surgery medications reviewed with the patient.  Reproductive/Obstetrics                            Anesthesia Physical Anesthesia Plan  ASA: III  Anesthesia Plan: MAC   Post-op Pain Management:    Induction: Intravenous  PONV Risk Score and Plan: 0 and Propofol infusion  Airway Management Planned: Nasal Cannula and Natural Airway  Additional Equipment:   Intra-op Plan:   Post-operative Plan:   Informed Consent: I have reviewed the patients History and Physical, chart, labs and discussed the procedure including the risks, benefits and alternatives for the proposed anesthesia with the patient or authorized representative who has indicated his/her understanding and acceptance.     Dental advisory given  Plan Discussed with: CRNA and Anesthesiologist  Anesthesia Plan Comments:         Anesthesia Quick Evaluation

## 2019-04-12 NOTE — Progress Notes (Signed)
Subjective:   Pt was seen at the bedside today. Pt states that he had a pinkish color bowel movements yesterday followed by several brown bowel movements. He has no nausea and denies constipation. No abdominal pain.  He had the endoscopy performed this morning and was given a capsule endoscopy that was deployed at 8 AM.  Patient has no other concerns at this time.  Objective: CBC Latest Ref Rng & Units 04/12/2019 04/11/2019 04/11/2019  WBC 4.0 - 10.5 K/uL 8.3 - 6.8  Hemoglobin 13.0 - 17.0 g/dL 8.0(L) 6.8(LL) 5.7(LL)  Hematocrit 39.0 - 52.0 % 23.9(L) 20.4(L) 18.3(L)  Platelets 150 - 400 K/uL 189 - 202   BMP Latest Ref Rng & Units 04/12/2019 04/11/2019 04/10/2019  Glucose 70 - 99 mg/dL 128(H) 118(H) 99  BUN 8 - 23 mg/dL 9 8 7(L)  Creatinine 0.61 - 1.24 mg/dL 0.79 1.08 0.97  Sodium 135 - 145 mmol/L 136 136 135  Potassium 3.5 - 5.1 mmol/L 3.6 3.9 3.7  Chloride 98 - 111 mmol/L 108 107 107  CO2 22 - 32 mmol/L 21(L) 22 20(L)  Calcium 8.9 - 10.3 mg/dL 7.7(L) 7.9(L) 7.8(L)   Vital signs in last 24 hours: Vitals:   04/12/19 0820 04/12/19 0829 04/12/19 0852 04/12/19 1025  BP: (!) 94/36 (!) 103/56 (!) 93/49 (!) 98/46  Pulse: (!) 57 (!) 58 60   Resp: 17 17 18    Temp:   (!) 97.4 F (36.3 C)   TempSrc:   Oral   SpO2: 100% 99%    Weight:      Height:       Physical Exam General: Resting in bed comfortably HEENT: NCAT CV: S1-S2, no murmurs rubs or gallops appreciated PULM: Clear in all lung fields bilaterally ABD: Ventral hernia that is nontender and soft and reducible. No tenderness to palpation in all quadrants NEURO: Alert and oriented, no focal deficits  Assessment/Plan:  Principal Problem:   Acute GI bleeding Active Problems:   Symptomatic anemia   Hepatic cirrhosis (HCC)   Hepatic encephalopathy (HCC)  In summary, Gavin Arroyo is a 64 year old male with a history of alcohol use disorder, cirrhosis, and a large ventral hernia who was admitted for acute hepatic encephalopathy in the setting  of receiving Ativan which subsequently resolved. He has also had a persistent GI bleed for which he has been hospitalized for multiple times in the past month.  He has received over 30 transfusions over the past month.  #GI bleed #Cirrhosis: Patient's had recurrent GI bleeds with multiple endoscopies and colonoscopies over the past month.  GI is involved with the patient's care. The patient received multiple endoscopies, colonoscopies and also a tagged RBC scan which was negative. 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 The patient continues to have normal bowel movements over the past couple days, which is reassuring.  However, his Hgb continues to downtrend daily.  Yesterday, his Hgb was 5.7 and was transfused 2 more units of pRBC. Hgb this AM was 8.0 -GI following, appreciate recommendations  -Repeat EGD was performed and did not show bleeding. Capsule endoscopy is currently being performed. The capsule was deployed at 0800 on 4/4.  -IV octreotide 12.5 mcg/hr continuous -Spironolactone 100 mg daily -Propranolol 100 mg twice daily -PO Lasix 40 mg daily -Lactulose 30 mg TID -Monitor Hgb with daily CBC  #Anemia: Hgb 8.0 today after 2 units pRBC were transfused yesterday. Secondary to chronic blood loss. Iron studies  demonstrate iron deficiency with ferritin of 34 and iron of 23.  Status post 1 dose of Feraheme on 4/2. -Daily CBC  #Hypotension: Patient's BPs are chronically soft in the low 90s. -Midodrin 5 mg TID -Hold Propranolol if BP <90  #FEN/GI -Diet: 2 g sodium -Fluids: None -Protonix 40 mg daily -Phenergan 12.5 mg tablet q6hrs PRN for nausea -Senna tablet daily PRN -Sucralfate 1g TID w/ meals -Multivitamin daily  -Feeding supplaments TID between meals  #DVT prophylaxis -SCDs  #CODE STATUS: FULL  #Dispo: Anticipated discharge pending medical course. Prior to Admission Living Arrangement: Home Anticipated  Discharge Location: Home w/ [facility] PT/OT Barriers to Discharge: Ongoing medical work-up  Earlene Plater, MD Internal Medicine, PGY1 Pager: [phone]  04/12/2019,11:12 AM

## 2019-04-12 NOTE — Op Note (Signed)
[facility] Patient Name: Gavin Arroyo Procedure Date : 04/12/2019 MRN: [id number] Attending MD: Clarene Essex , MD Date of Birth: March 17, 1955 CSN: [id number] Age: 64 Admit Type: Inpatient Procedure:                Upper GI endoscopy Indications:              Suspected upper gastrointestinal bleeding in                            patient with chronic blood loss with cirrhosis Providers:                Clarene Essex, MD, Jeanella Cara, RN, Elspeth Cho Tech., Technician, Clearnce Sorrel, CRNA Referring MD:              Medicines:                Propofol total dose 123456 mg IV Complications:            No immediate complications. Estimated Blood Loss:     Estimated blood loss: none. Procedure:                Pre-Anesthesia Assessment:                           - Prior to the procedure, a History and Physical                            was performed, and patient medications and                            allergies were reviewed. The patient's tolerance of                            previous anesthesia was also reviewed. The risks                            and benefits of the procedure and the sedation                            options and risks were discussed with the patient.                            All questions were answered, and informed consent                            was obtained. Prior Anticoagulants: The patient has                            taken no previous anticoagulant or antiplatelet                            agents. ASA Grade Assessment: II - A patient with  mild systemic disease. After reviewing the risks                            and benefits, the patient was deemed in                            satisfactory condition to undergo the procedure.                           After obtaining informed consent, the endoscope was                            passed under direct vision. Throughout the                             procedure, the patient's blood pressure, pulse, and                            oxygen saturations were monitored continuously. The                            [id number] Olympus gastroscope was                            introduced through the mouth, and advanced to the                            third part of duodenum. The upper GI endoscopy was                            accomplished without difficulty. The patient                            tolerated the procedure well. Scope In: Scope Out: Findings:      There was very minimal grade I varices were found in the lower third of       the esophagus. They were diminutive in size.      Mild portal hypertensive gastropathy was found on the greater curvature       of the stomach and on the lesser curvature of the stomach. And mild       antritis as well      The duodenal bulb, first portion of the duodenum, second portion of the       duodenum and third portion of the duodenum were normal.      The exam was otherwise without abnormality. After a complete endoscopy       the scope was removed and the capsule released apparatus was attached to       the scope and the capsule was released in the duodenum Impression:               -Barely grade I esophageal varices.                           - Portal hypertensive gastropathy. Mild antritis as  well                           - Normal duodenal bulb, first portion of the                            duodenum, second portion of the duodenum and third                            portion of the duodenum.                           - The examination was otherwise normal.                           - No specimens collected. No signs of bleeding Recommendation:           - NPO for 2 hours. Then follow diet slow                            advancement per capsule endoscopy instructions                           - Continue present medications. Consider increasing                             Inderal to 20 mg twice a day                           - Return to GI clinic PRN.                           - Telephone GI clinic if symptomatic PRN.                           - To visualize the small bowel, perform video                            capsule endoscopy today and placed in endoscopy as                            above. Procedure Code(s):        --- Professional ---                           [phone], Esophagogastroduodenoscopy, flexible,                            transoral; diagnostic, including collection of                            specimen(s) by brushing or washing, when performed                            (separate procedure) Diagnosis Code(s):        --- Professional ---  I85.00, Esophageal varices without bleeding                           K76.6, Portal hypertension                           K31.89, Other diseases of stomach and duodenum                           R58, Hemorrhage, not elsewhere classified CPT copyright 2019 [employer]. All rights reserved. The codes documented in this report are preliminary and upon coder review may  be revised to meet current compliance requirements. Clarene Essex, MD 04/12/2019 8:14:03 AM This report has been signed electronically. Number of Addenda: 0

## 2019-04-13 ENCOUNTER — Ambulatory Visit: Payer: Self-pay | Admitting: Family Medicine

## 2019-04-13 ENCOUNTER — Telehealth: Payer: Self-pay | Admitting: Internal Medicine

## 2019-04-13 LAB — CBC
HCT: 21.7 % — ABNORMAL LOW (ref 39.0–52.0)
Hemoglobin: 6.9 g/dL — CL (ref 13.0–17.0)
MCH: 30.4 pg (ref 26.0–34.0)
MCHC: 31.8 g/dL (ref 30.0–36.0)
MCV: 95.6 fL (ref 80.0–100.0)
Platelets: 199 10*3/uL (ref 150–400)
RBC: 2.27 MIL/uL — ABNORMAL LOW (ref 4.22–5.81)
RDW: 15.6 % — ABNORMAL HIGH (ref 11.5–15.5)
WBC: 7.5 10*3/uL (ref 4.0–10.5)
nRBC: 0 % (ref 0.0–0.2)

## 2019-04-13 LAB — HEMOGLOBIN AND HEMATOCRIT, BLOOD
HCT: 23.3 % — ABNORMAL LOW (ref 39.0–52.0)
HCT: 21.6 % — ABNORMAL LOW (ref 39.0–52.0)
Hemoglobin: 7.5 g/dL — ABNORMAL LOW (ref 13.0–17.0)
Hemoglobin: 7 g/dL — ABNORMAL LOW (ref 13.0–17.0)

## 2019-04-13 LAB — COMPREHENSIVE METABOLIC PANEL
ALT: 32 U/L (ref 0–44)
AST: 50 U/L — ABNORMAL HIGH (ref 15–41)
Albumin: 1.6 g/dL — ABNORMAL LOW (ref 3.5–5.0)
Alkaline Phosphatase: 72 U/L (ref 38–126)
Anion gap: 8 (ref 5–15)
BUN: 9 mg/dL (ref 8–23)
CO2: 23 mmol/L (ref 22–32)
Calcium: 7.8 mg/dL — ABNORMAL LOW (ref 8.9–10.3)
Chloride: 108 mmol/L (ref 98–111)
Creatinine, Ser: 0.8 mg/dL (ref 0.61–1.24)
GFR calc Af Amer: >60 mL/min (ref >60)
GFR calc non Af Amer: >60 mL/min (ref >60)
Glucose, Bld: 118 mg/dL — ABNORMAL HIGH (ref 70–99)
Potassium: 3.3 mmol/L — ABNORMAL LOW (ref 3.5–5.1)
Sodium: 139 mmol/L (ref 135–145)
Total Bilirubin: 0.7 mg/dL (ref 0.3–1.2)
Total Protein: 4 g/dL — ABNORMAL LOW (ref 6.5–8.1)

## 2019-04-13 LAB — PREPARE RBC (CROSSMATCH)

## 2019-04-13 LAB — PROTIME-INR
INR: 1.4 — ABNORMAL HIGH (ref 0.8–1.2)
Prothrombin Time: 17.4 seconds — ABNORMAL HIGH (ref 11.4–15.2)

## 2019-04-13 MED ORDER — SODIUM CHLORIDE 0.9% IV SOLUTION: Freq: Once | INTRAVENOUS | Status: DC

## 2019-04-13 MED ORDER — MIDODRINE HCL 5 MG PO TABS
10.0000 mg | ORAL_TABLET | Freq: Three times a day (TID) | ORAL | Status: DC
  Administered 2019-04-13 – 2019-04-17 (×14): 10 mg via ORAL
  Filled 2019-04-13 (×15): qty 2

## 2019-04-13 MED ORDER — POLYVINYL ALCOHOL 1.4 % OP SOLN
1.0000 [drp] | OPHTHALMIC | Status: DC | PRN
  Filled 2019-04-13: qty 15

## 2019-04-13 MED ORDER — ENSURE ENLIVE PO LIQD
237.0000 mL | Freq: Two times a day (BID) | ORAL | Status: DC
  Administered 2019-04-13 – 2019-04-17 (×8): 237 mL via ORAL

## 2019-04-13 MED ORDER — SALINE SPRAY 0.65 % NA SOLN
1.0000 | NASAL | Status: DC | PRN
  Filled 2019-04-13: qty 44

## 2019-04-13 NOTE — Procedures (Signed)
Small bowel pillcam showed Bleeding in mid and distal small bowel.  Recommend  Transfer to tertiary center for double balloon enteroscopy. Discussed with patient's medical resident.  Ronnette Juniper, MD

## 2019-04-13 NOTE — Progress Notes (Signed)
CRITICAL VALUE ALERT  Critical Value: Hemoglobin 6.9  Date & Time Notied:  04/13/2019 5:10  Provider Notified: Madilyn Fireman  Orders Received/Actions taken: Awaiting callback or orders to be placed.

## 2019-04-13 NOTE — Telephone Encounter (Signed)
Received call from RN at [facility] regarding Mr. Gavin Arroyo transfer to GI department at [facility] for double balloon endoscopy. Patient is accepted for transfer.  Dr. Richardean Chimera will be the accepting physician at [facility].   They anticipate transfer at the end of the week and they will contact our hospital later this week for arrangement.

## 2019-04-13 NOTE — Progress Notes (Addendum)
Just discussed findings of capsule endoscopy findings with GI.  There is a significant amount of bleeding in the distal small bowel, as well as blood seen in the colon.  In order to adequately address this issue, the patient would need to undergo a double-balloon enteroscopy, which cannot be performed here at [facility], or [facility].  The patient needs to be transferred to tertiary center like to [facility] or [facility] to have this done.  We will start the process of getting the patient transferred immediately.  ADDENDUM:  Spoke with transfer coordinator at [facility], they are reaching out to the inpatient medicine and hepatology physicians to start the transfer process.   Earlene Plater, MD Internal Medicine, PGY1 Pager: [phone]  04/13/2019,12:41 PM

## 2019-04-13 NOTE — Progress Notes (Signed)
Nutrition Follow-up  DOCUMENTATION CODES:   Non-severe (moderate) malnutrition in context of chronic illness  INTERVENTION:  Provide Ensure Enlive po BID, each supplement provides 350 kcal and 20 grams of protein.  Provide 30 ml Prostat po TID, each supplement provides 100 kcal and 15 grams of protein.   Encourage adequate PO intake.   NUTRITION DIAGNOSIS:   Moderate Malnutrition related to chronic illness(cirrhosis) as evidenced by moderate muscle depletion, moderate fat depletion; ongoing  GOAL:   Patient will meet greater than or equal to 90% of their needs; met  MONITOR:   PO intake, Supplement acceptance, Skin, Weight trends, Labs, I & O's  REASON FOR ASSESSMENT:   Consult Assessment of nutrition requirement/status  ASSESSMENT:   65 year old male with a past medical history of cirrhosis, GI bleed, and ventral hernia who presented with acute encephalopathy and admitted for decompensated cirrhosis and persistent GI bleed EGD 4/4. Small bowel pillcam showed Bleeding in mid and distal small bowel.  Meal completion has been 100%. Pt currently has Prostat and Boost Breeze ordered and has been consuming them. RD to order Ensure instead of Boost Breeze to aid in increased caloric and protein needs. Per MD, plans for pt to undergo a double-balloon enteroscopy due to small bowel bleed, which cannot be performed here at [facility], or [place] to transfer to tertiary center for procedure.   Labs and medications reviewed.   Diet Order:   Diet Order            Diet 2 gram sodium Room service appropriate? Yes; Fluid consistency: Thin  Diet effective now              EDUCATION NEEDS:   Not appropriate for education at this time  Skin:  Skin Assessment: Reviewed RN Assessment  Last BM:  4/5  Height:   Ht Readings from Last 1 Encounters:  04/12/19 5' 10"  (1.778 m)    Weight:   Wt Readings from Last 1 Encounters:  04/13/19 73.9 kg    BMI:  Body mass index  is 23.38 kg/m.  Estimated Nutritional Needs:   Kcal:  2200-2400  Protein:  115-125 grams  Fluid:  >/= 2 L/day   Corrin Parker, MS, RD, LDN RD pager number/after hours weekend pager number on Amion.

## 2019-04-13 NOTE — Progress Notes (Signed)
Occupational Therapy Treatment and Discharge   Clinical Impressions: Pt received in bed and continues to be frustrated about timeline of discharging from hospital. [place] for bed mobility, sit to stand, stand pivot, and mobility to/from bathroom without AD. Pt completed entirety of toileting task and subsequent hand hygiene Independently without LOB or reports of dizziness. Assessed pt's BP in between each transitional movement due to consistently low Hgb. Readings at 104/54 at rest, 108/66 sitting EOB, and 122/73 in standing. Due to no symptoms or safety concerns during session despite low hgb, pt Independent with tasks and ready for DC from skilled OT services today. No OT follow-up indicated at DC.     04/13/19 0800  OT Visit Information  Last OT Received On 04/13/19  Assistance Needed +1  History of Present Illness  64 y/o male, with a PMH of GI bleed, ventral hernia, and compensated liver cirrhosis, who presents to [facility] from [place] for a GI bleed. Per [facility], patient presented to [facility] he was found to have a hemoglobin of 5.8 with an elevated ammonia level of 87.  Precautions  Precautions None  Precaution Comments check BP  Pain Assessment  Pain Assessment No/denies pain  Cognition  Arousal/Alertness Awake/alert  Behavior During Therapy WFL for tasks assessed/performed  Overall Cognitive Status Within Functional Limits for tasks assessed  General Comments Pt a bit frustrated not being discharged yesterday, but not outright agitated. still polite with therapist   Upper Extremity Assessment  Upper Extremity Assessment Overall WFL for tasks assessed  ADL  Overall ADL's  Independent  Grooming Wash/dry hands;Standing  Armed forces technical officer Independent;Ambulation;Regular Radio producer Details (indicate cue type and reason) Independent for mobility to/from bathroom without AD  Toileting- Water quality scientist and Hygiene Independent;Sit to/from stand;Sitting/lateral lean   Toileting - Clothing Manipulation Details (indicate cue type and reason) Independent for toileting task, no safety concerns  Bed Mobility  Overal bed mobility Independent  Restrictions  Weight Bearing Restrictions No  Transfers  Overall transfer level Independent  Equipment used None  Transfers Sit to/from [employer] Transfers  Sit to Stand Independent  Stand pivot transfers Independent  General Comments  General comments (skin integrity, edema, etc.) No dizziness reported with improving BP readings. 104/54 at rest in bed, 108/66 sitting EOB, 122/72 standing EOB  OT - End of Session  Activity Tolerance Patient tolerated treatment well  Patient left in bed;with call bell/phone within reach  OT Assessment/Plan  OT Plan Discharge plan needs to be updated;Frequency needs to be updated  OT Visit Diagnosis Unsteadiness on feet (R26.81)  Follow Up Recommendations No OT follow up  OT Equipment None recommended by OT  AM-PAC OT "6 Clicks" Daily Activity Outcome Measure (Version 2)  Help from another person eating meals? 4  Help from another person taking care of personal grooming? 4  Help from another person toileting, which includes using toliet, bedpan, or urinal? 4  Help from another person bathing (including washing, rinsing, drying)? 4  Help from another person to put on and taking off regular upper body clothing? 4  Help from another person to put on and taking off regular lower body clothing? 4  6 Click Score 24  OT Goal Progression  Progress towards OT goals Progressing toward goals  Acute Rehab OT Goals  Patient Stated Goal to be discharged prior to court date  Time For Goal Achievement 04/14/19  Potential to Achieve Goals Good  ADL Goals  Pt Will Perform Grooming Independently;standing  Pt Will Perform Lower Body  Bathing Independently;sit to/from stand  Pt Will Perform Lower Body Dressing Independently;sit to/from stand  Pt Will Transfer to Toilet  Independently;ambulating;regular height toilet  Pt Will Perform Toileting - Clothing Manipulation and hygiene Independently;sit to/from stand;sitting/lateral leans  Pt Will Perform Tub/Shower Transfer Tub transfer;with modified independence  OT Time Calculation  OT Start Time (ACUTE ONLY) 0831  OT Stop Time (ACUTE ONLY) 0843  OT Time Calculation (min) 12 min  OT General Charges  $OT Visit 1 Visit  OT Treatments  $Therapeutic Activity 8-22 mins

## 2019-04-13 NOTE — Progress Notes (Signed)
[facility] Gastroenterology Progress Note  COLORADO CUVELIER 64 y.o. May 20, 1955  CC:  GI bleed, anemia    Subjective: Patient seen and examined at bedside.  Somewhat frustrated and wants to go home.  Denies overt GI bleeding.  ROS : Afebrile.  Negative for chest pain   Objective: Vital signs in last 24 hours: Vitals:   04/13/19 0456 04/13/19 0835  BP: 102/64 108/66  Pulse: 67 74  Resp:    Temp: 98.9 F (37.2 C) 98.3 F (36.8 C)  SpO2: 100%     Physical Exam:  General.  Well developed.  Not in acute distress HEENT.  Normocephalic atraumatic Abdomen.  Large ventral hernia noted.  Abdomen is soft, nondistended, bowel sounds present Psych.  Mood and affect normal Neuro.  Alert/oriented x3   Lab Results: Recent Labs    04/12/19 0217 04/13/19 0357  NA 136 139  K 3.6 3.3*  CL 108 108  CO2 21* 23  GLUCOSE 128* 118*  BUN 9 9  CREATININE 0.79 0.80  CALCIUM 7.7* 7.8*   Recent Labs    04/12/19 0217 04/13/19 0357  AST 51* 50*  ALT 34 32  ALKPHOS 74 72  BILITOT 0.9 0.7  PROT 4.1* 4.0*  ALBUMIN 1.5* 1.6*   Recent Labs    04/12/19 0217 04/13/19 0357  WBC 8.3 7.5  HGB 8.0* 6.9*  HCT 23.9* 21.7*  MCV 90.9 95.6  PLT 189 199   Recent Labs    04/12/19 0217 04/13/19 0357  LABPROT 17.0* 17.4*  INR 1.4* 1.4*      Assessment/Plan: -Recurrent blood loss anemia.  Could be from diffuse mucosal bleeding from portal hypertensive gastropathy versus small bowel bleeding.  Capsule endoscopy pending - Large ventral hernia  GI work-up so far Initially seen by GI on March 14, 2019 for hematochezia.  Colonoscopy March 16, 2019 showed multiple small and large colonic polyps.  EGD showed grade 1 esophageal varices and portal hypertensive gastropathy.  Because of ongoing bleeding repeat colonoscopy was done on March 20, 2019.  Colonoscopy was advanced up to transverse colon.  Was found to have ulcerated mucosa from possible prior polypectomy.  1 clip was placed at transverse  colon.  EGD April 02, 2019 showed small esophageal varices, portal hypertensive gastropathy and scattered hematin in the gastric body.  Another colonoscopy April 07, 2019 showed scattered old blood throughout the colon.  Bleeding scan - April 08, 2019.  EGD April 12, 2019 showed similar finding.   Recommendations ----------------------- -Drop in hemoglobin noted.  Patient denies overt bleeding. -Transfuse 1 unit of PRBC -Capsule endoscopy pending today -DC octreotide -GI will follow  Otis Brace MD, FACP 04/13/2019, 9:38 AM  Contact #  [phone]

## 2019-04-13 NOTE — Progress Notes (Signed)
Patient ID: Gavin Arroyo, male   DOB: 02/08/1955, 64 y.o.   MRN: [id number]   Subjective: HD: 14  Overnight: Hgb dropped to 639, 1 U pRBC ordered  Gavin Arroyo was seen this morning on rounds and expressed frustration at his ongoing GI bleeding and length of hospital stay. We discussed the need to remain hospitalized in the setting of unstable Hgb levels, which expressed understanding of. He endorses one loose brown bowel movement overnight and denies any ongoing melena or hematochezia. He denies shortness of breath, chest pain, dizziness, and abdominal pain.  Over all he expresses feeling fine in general.    Objective:  Vital signs in last 24 hours: Vitals:   04/12/19 2142 04/12/19 2150 04/13/19 0456 04/13/19 0457  BP: (!) 88/46 (!) 110/53 102/64   Pulse: 65 66 67   Resp:      Temp:   98.9 F (37.2 C)   TempSrc:   Oral   SpO2: 99%  100%   Weight:    73.9 kg  Height:       Weight change:   Intake/Output Summary (Last 24 hours) at 04/13/2019 0646 Last data filed at 04/12/2019 2045 Gross per 24 hour  Intake 350 ml  Output 140 ml  Net 210 ml   Physical Exam Cardiovascular:     Rate and Rhythm: Normal rate and regular rhythm.     Pulses: Normal pulses.     Heart sounds: Normal heart sounds.  Pulmonary:     Effort: Pulmonary effort is normal.     Breath sounds: Normal breath sounds.  Abdominal:     General: Bowel sounds are normal.     Palpations: There is fluid wave.     Tenderness: There is no abdominal tenderness.     Hernia: A hernia is present. Hernia is present in the ventral area.  Skin:    General: Skin is warm and dry.    CBC Latest Ref Rng & Units 04/13/2019 04/12/2019 04/11/2019  WBC 4.0 - 10.5 K/uL 7.5 8.3 -  Hemoglobin 13.0 - 17.0 g/dL 6.9(LL) 8.0(L) 6.8(LL)  Hematocrit 39.0 - 52.0 % 21.7(L) 23.9(L) 20.4(L)  Platelets 150 - 400 K/uL 199 189 -   CMP Latest Ref Rng & Units 04/13/2019 04/12/2019 04/11/2019  Glucose 70 - 99 mg/dL 118(H) 128(H) 118(H)  BUN 8 - 23 mg/dL 9 9  8   Creatinine 0.61 - 1.24 mg/dL 0.80 0.79 1.08  Sodium 135 - 145 mmol/L 139 136 136  Potassium 3.5 - 5.1 mmol/L 3.3(L) 3.6 3.9  Chloride 98 - 111 mmol/L 108 108 107  CO2 22 - 32 mmol/L 23 21(L) 22  Calcium 8.9 - 10.3 mg/dL 7.8(L) 7.7(L) 7.9(L)  Total Protein 6.5 - 8.1 g/dL 4.0(L) 4.1(L) 4.0(L)  Total Bilirubin 0.3 - 1.2 mg/dL 0.7 0.9 0.8  Alkaline Phos 38 - 126 U/L 72 74 78  AST 15 - 41 U/L 50(H) 51(H) 64(H)  ALT 0 - 44 U/L 32 34 40   INR:  04/05  1.4 04/04  1.4 04/03  1.4   Assessment/Plan:  Principal Problem:   Acute GI bleeding Active Problems:   Symptomatic anemia   Hepatic cirrhosis (HCC)   Hepatic encephalopathy (HCC)  Summary:  Gavin Arroyo is a 64 year old male with a history of cirrhosis and large ventral hernia that is admitted for ongoing persistent GI bleed on hospital day 14.   Acute GI bleeding:  Hgb dropped overnight to 6.9 and 1 U pRBC was transfused this morning. Despite  this drop in Hgb the patient does not endorse any melena or hematochezia which is reassuring. Source of ongoing bleeding likely portal HTN gastropathy v small bowel bleed. Currently undergoing capsule endoscopy; pending results. GI following; appreciate all recommendations. - D/C octreotide - continue protonix - continue propranolol 10 mg BID - continue sucralfate 1 g TID for gastropathy   Hepatic cirrhosis, encephalopathy: Had low bp and MAP overnight. Evidence of small ascites on physical exam. No current evidence of encephalopathy. - will hold spironolactone and lasix in setting of low MAP and BP - increased midodrine to 10 mg TID in setting of low MAP and BP - will monitor closely for fever, abdominal pain, leukocytosis, or encephalopathy given evidence of ascites - continue lactulose  Diet: 2 g sodium  Code: full  VTE ppx: SCD, no lovenox due to GI bleed Dispo: pending ongoing medical workup Discharge location: home with [facility] for PT and OT    LOS: 14 days   Mikael Spray,  Medical Student 04/13/2019, 6:46 AM

## 2019-04-14 LAB — CBC
HCT: 22.7 % — ABNORMAL LOW (ref 39.0–52.0)
HCT: 17.9 % — ABNORMAL LOW (ref 39.0–52.0)
Hemoglobin: 7.2 g/dL — ABNORMAL LOW (ref 13.0–17.0)
Hemoglobin: 5.8 g/dL — CL (ref 13.0–17.0)
MCH: 30.5 pg (ref 26.0–34.0)
MCH: 31.2 pg (ref 26.0–34.0)
MCHC: 31.7 g/dL (ref 30.0–36.0)
MCHC: 32.4 g/dL (ref 30.0–36.0)
MCV: 96.2 fL (ref 80.0–100.0)
MCV: 96.2 fL (ref 80.0–100.0)
Platelets: 221 10*3/uL (ref 150–400)
Platelets: 189 10*3/uL (ref 150–400)
RBC: 2.36 MIL/uL — ABNORMAL LOW (ref 4.22–5.81)
RBC: 1.86 MIL/uL — ABNORMAL LOW (ref 4.22–5.81)
RDW: 15.9 % — ABNORMAL HIGH (ref 11.5–15.5)
RDW: 17.2 % — ABNORMAL HIGH (ref 11.5–15.5)
WBC: 12 10*3/uL — ABNORMAL HIGH (ref 4.0–10.5)
WBC: 12 10*3/uL — ABNORMAL HIGH (ref 4.0–10.5)
nRBC: 0.2 % (ref 0.0–0.2)
nRBC: 0 % (ref 0.0–0.2)

## 2019-04-14 LAB — PROTIME-INR
INR: 1.4 — ABNORMAL HIGH (ref 0.8–1.2)
Prothrombin Time: 17.1 seconds — ABNORMAL HIGH (ref 11.4–15.2)

## 2019-04-14 LAB — PREPARE RBC (CROSSMATCH)

## 2019-04-14 MED ORDER — FUROSEMIDE 40 MG PO TABS
40.0000 mg | ORAL_TABLET | Freq: Every day | ORAL | Status: DC
  Administered 2019-04-14 – 2019-04-17 (×4): 40 mg via ORAL
  Filled 2019-04-14 (×4): qty 1

## 2019-04-14 MED ORDER — SODIUM CHLORIDE 0.9% IV SOLUTION: Freq: Once | INTRAVENOUS | Status: DC

## 2019-04-14 MED ORDER — SPIRONOLACTONE 25 MG PO TABS
100.0000 mg | ORAL_TABLET | Freq: Every day | ORAL | Status: DC
  Administered 2019-04-14 – 2019-04-17 (×4): 100 mg via ORAL
  Filled 2019-04-14 (×4): qty 4

## 2019-04-14 NOTE — Progress Notes (Signed)
Patient ID: Gavin Arroyo, male   DOB: [birth year], 64 y.o.   MRN: [id number]   Subjective:  HD: 15  Patient feels "alright" this morning. He notes seeing blood in stool that is strawberry colored in the evenings. He denies any abdominal pain, chest pain or shortness of breath.  We discussed the plans for transfer to [facility] which he agreed to. His only concern transportation home after being transferred, he was informed that [place] will be able to arrange transportation back to his home after discharge from there. He had no further questions or concerns at this time.    Objective:  Vital signs in last 24 hours: Vitals:   04/13/19 1604 04/13/19 2154 04/14/19 0001 04/14/19 0407  BP: (!) 111/50 (!) 101/52 (!) 98/55 (!) 104/54  Pulse: 64 80 75 80  Resp: 18 18 17 18   Temp: 98.5 F (36.9 C) 98.4 F (36.9 C) 98.7 F (37.1 C) 98.5 F (36.9 C)  TempSrc: Oral Oral Oral Oral  SpO2: 98% 97% 98% 99%  Weight:      Height:       Weight change:   Intake/Output Summary (Last 24 hours) at 04/14/2019 0701 Last data filed at 04/14/2019 0000 Gross per 24 hour  Intake 330.85 ml  Output 800 ml  Net -469.15 ml   Physical Exam  Cardiovascular: Normal rate, regular rhythm, normal heart sounds and intact distal pulses.  Respiratory: Effort normal and breath sounds normal.  GI: Bowel sounds are normal. He exhibits distension and fluid wave. There is no abdominal tenderness. A hernia is present. Hernia confirmed positive in the ventral area.  Stool was visualized in pan over toilet and was dark red in color and liquid.   Neurological: He is alert.  Skin: Skin is warm and dry.   CBC Latest Ref Rng & Units 04/14/2019 04/13/2019 04/13/2019  WBC 4.0 - 10.5 K/uL 12.0(H) - -  Hemoglobin 13.0 - 17.0 g/dL 7.2(L) 7.0(L) 7.5(L)  Hematocrit 39.0 - 52.0 % 22.7(L) 21.6(L) 23.3(L)  Platelets 150 - 400 K/uL 221 - -   INR: 04/06 1.4 04/05 1.4 04/04 1.4  Assessment/Plan:  Principal Problem:   Acute GI bleeding Active  Problems:   Symptomatic anemia   Hepatic cirrhosis (HCC)   Hepatic encephalopathy ([place])  Summary:  Gavin Arroyo is a 64 year old male with a history of cirrhosis and large ventral hernia admitted for persistent GI bleed on HD 15.   Acute GI bleed:  Hgb this morning was 7.2, will recheck this afternoon anticipating possible transfusion need. Stool now shows frank bright red blood. GI onboard and recommending double balloon endoscopy; we appreciate recommendations. He will need to be transferred to tertiary care center for this procedure. [facility] has been contacted and accepted him for transfer later this week.  - continue protonix - continue sucralfate - d/c propranolol  - transfer to tertiary care center this week; accepted at [facility] for double balloons endoscopy   Cirrhosis, encephalopathy:  Encephalopathy has resolved with lactulose. Abdomen was distended on exam and fluid wave was present providing evidence of ascites. There is concern that there is more significant ascites than previously given the abdominal distention in the setting of his diuretics being held. He also has an increased WBC today which is new. He has remained afebrile and has had no abdominal pain and no tenderness to palpation. Will watch closely for signs of SBP.  - restart spironolactone and lasix - midodrine 10 mg TID - continue lactulose   Diet:  2 g sodium  Code: full  VTE ppx: SCD, no lovenox due to GI bleed Dispo: pending ongoing medical workup Discharge location: home with [facility] for PT and OT     LOS: 15 days   Mikael Spray, Medical Student 04/14/2019, 7:01 AM

## 2019-04-14 NOTE — Progress Notes (Signed)
CRITICAL VALUE ALERT  Critical Value:  Hemoglobin 5.8  Date & Time Notied:  04/14/19, 1559  Provider Notified: MD. Mallie Mussel   Orders Received/Actions taken:  waiting for orders

## 2019-04-14 NOTE — Progress Notes (Signed)
[facility] Gastroenterology Progress Note  Gavin Arroyo 64 y.o. December 22, 1955  CC:  GI bleed, anemia    Subjective: Patient seen and examined at bedside.  Complaining of maroon-colored stool.  Usually 4-5 bowel movements per day.  Denies abdominal pain, nausea and vomiting.  ROS : Afebrile.  Negative for chest pain   Objective: Vital signs in last 24 hours: Vitals:   04/14/19 0407 04/14/19 0850  BP: (!) 104/54 (!) 101/52  Pulse: 80 87  Resp: 18 17  Temp: 98.5 F (36.9 C) 99.6 F (37.6 C)  SpO2: 99% 98%    Physical Exam:  General.  Well developed.  Not in acute distress HEENT.  Normocephalic atraumatic Abdomen.  Large ventral hernia noted.  Abdomen is soft, nondistended, bowel sounds present Psych.  Mood and affect normal Neuro.  Alert/oriented x3   Lab Results: Recent Labs    04/12/19 0217 04/13/19 0357  NA 136 139  K 3.6 3.3*  CL 108 108  CO2 21* 23  GLUCOSE 128* 118*  BUN 9 9  CREATININE 0.79 0.80  CALCIUM 7.7* 7.8*   Recent Labs    04/12/19 0217 04/13/19 0357  AST 51* 50*  ALT 34 32  ALKPHOS 74 72  BILITOT 0.9 0.7  PROT 4.1* 4.0*  ALBUMIN 1.5* 1.6*   Recent Labs    04/13/19 0357 04/13/19 1823 04/13/19 2302 04/14/19 0059  WBC 7.5  --   --  12.0*  HGB 6.9*   < > 7.0* 7.2*  HCT 21.7*   < > 21.6* 22.7*  MCV 95.6  --   --  96.2  PLT 199  --   --  221   < > = values in this interval not displayed.   Recent Labs    04/13/19 0357 04/14/19 0059  LABPROT 17.4* 17.1*  INR 1.4* 1.4*      Assessment/Plan: -Recurrent blood loss anemia.  Could be from diffuse mucosal bleeding from portal hypertensive gastropathy versus small bowel bleeding.  Capsule endoscopy showed diffuse bleeding from mid to distal small bowel all the way up to cecum - Large ventral hernia  GI work-up so far Initially seen by GI on March 14, 2019 for hematochezia.  Colonoscopy March 16, 2019 showed multiple small and large colonic polyps.  EGD showed grade 1 esophageal varices and  portal hypertensive gastropathy.  Because of ongoing bleeding repeat colonoscopy was done on March 20, 2019.  Colonoscopy was advanced up to transverse colon.  Was found to have ulcerated mucosa from possible prior polypectomy.  1 clip was placed at transverse colon.  EGD April 02, 2019 showed small esophageal varices, portal hypertensive gastropathy and scattered hematin in the gastric body.  Another colonoscopy April 07, 2019 showed scattered old blood throughout the colon.  Bleeding scan - April 08, 2019.  EGD April 12, 2019 showed similar finding.   Recommendations ----------------------- -Patient was discussed with [facility] GI team yesterday.  He has been accepted for double-balloon enteroscopy.  Currently they do not have any open slot for double-balloon enteroscopy and they will keep him on the list for transfer   -Continue supportive care.  Capsule endoscopy findings and need for transfer to [facility] were discussed with patient.  He verbalized understanding  -Transfuse if hemoglobin less than 7.  Otis Brace MD, [place] 04/14/2019, 8:56 AM  Contact #  [phone]

## 2019-04-14 NOTE — Progress Notes (Signed)
Patient is receiving one unit of RBC at this time. Patient is alert and oriented his last vitals BP 97/48, HR 75, temp 98.8, rr 18, oxygen saturation 95% on room air. No shortness of breath or discomfort at this time. Will continue to monitor.

## 2019-04-14 NOTE — Progress Notes (Signed)
CSW met with pt. To address court situation. Pt needed letter that he would be missing his court date later in the week due to being in hospital. [facility]. Pt. Gave his lawyers name, Erling Cruz, in [place] and consent to [place] sending lawyer letter. CSW called attorney's office obtained contact info and emailed letter to [email or web address].

## 2019-04-15 LAB — TYPE AND SCREEN
ABO/RH(D): A POS
Antibody Screen: NEGATIVE
Unit division: 0
Unit division: 0
Unit division: 0
Unit division: 0
Unit division: 0

## 2019-04-15 LAB — BPAM RBC
Blood Product Expiration Date: 202104252359
Blood Product Expiration Date: 202104252359
Blood Product Expiration Date: 202104302359
Blood Product Expiration Date: 202104302359
Blood Product Expiration Date: 202104302359
ISSUE DATE / TIME: 202104030820
ISSUE DATE / TIME: 202104031531
ISSUE DATE / TIME: 202104051308
ISSUE DATE / TIME: 202104061725
ISSUE DATE / TIME: 202104062031
Unit Type and Rh: 6200
Unit Type and Rh: 6200
Unit Type and Rh: 6200
Unit Type and Rh: 6200
Unit Type and Rh: 6200

## 2019-04-15 LAB — CBC
HCT: 20.1 % — ABNORMAL LOW (ref 39.0–52.0)
Hemoglobin: 6.7 g/dL — CL (ref 13.0–17.0)
MCH: 31.6 pg (ref 26.0–34.0)
MCHC: 33.3 g/dL (ref 30.0–36.0)
MCV: 94.8 fL (ref 80.0–100.0)
Platelets: 165 10*3/uL (ref 150–400)
RBC: 2.12 MIL/uL — ABNORMAL LOW (ref 4.22–5.81)
RDW: 17.2 % — ABNORMAL HIGH (ref 11.5–15.5)
WBC: 11.8 10*3/uL — ABNORMAL HIGH (ref 4.0–10.5)
nRBC: 0 % (ref 0.0–0.2)

## 2019-04-15 LAB — HEMOGLOBIN AND HEMATOCRIT, BLOOD
HCT: 22.7 % — ABNORMAL LOW (ref 39.0–52.0)
HCT: 22.1 % — ABNORMAL LOW (ref 39.0–52.0)
Hemoglobin: 7.5 g/dL — ABNORMAL LOW (ref 13.0–17.0)
Hemoglobin: 7.2 g/dL — ABNORMAL LOW (ref 13.0–17.0)

## 2019-04-15 LAB — PROTIME-INR
INR: 1.5 — ABNORMAL HIGH (ref 0.8–1.2)
Prothrombin Time: 17.7 seconds — ABNORMAL HIGH (ref 11.4–15.2)

## 2019-04-15 LAB — PREPARE RBC (CROSSMATCH)

## 2019-04-15 MED ORDER — SODIUM CHLORIDE 0.9% IV SOLUTION: Freq: Once | INTRAVENOUS | Status: AC

## 2019-04-15 NOTE — Progress Notes (Signed)
Patient ID: Gavin Arroyo, male   DOB: [birth year], 64 y.o.   MRN: [id number]   Subjective: HD: 16  Overnight: Hgb dropped to 5.9 and received 2 U pRBC; post transfusion Hgb 7.5; low MAP 55-57 for a short period yesterday evening, now resolved   Gavin Arroyo was seen this morning on rounds and reports feeling about the same as he has for the last few days. He states that he is ready to get out of the hospital, but understands that he needs to be here still.  He does endorse 4-5 large, loose bowel movements since yesterday that were composed entirely of red blood. He denies any chest pain, shortness of breath, dizziness, or abdominal pain.    Objective:  Vital signs in last 24 hours: Vitals:   04/14/19 2110 04/14/19 2342 04/15/19 0258 04/15/19 0355  BP: (!) 98/53 (!) 98/55  (!) 96/54  Pulse: 63 70  81  Resp: 16 16  16   Temp: 98.3 F (36.8 C) 97.6 F (36.4 C)  98.7 F (37.1 C)  TempSrc: Oral Oral  Oral  SpO2: 95%   100%  Weight:   70.2 kg   Height:       Weight change:   Intake/Output Summary (Last 24 hours) at 04/15/2019 0641 Last data filed at 04/15/2019 0400 Gross per 24 hour  Intake 585 ml  Output 121 ml  Net 464 ml   Physical Exam  Cardiovascular: Normal rate, regular rhythm, normal heart sounds and intact distal pulses.  Respiratory: Effort normal and breath sounds normal.  GI: Bowel sounds are normal. He exhibits distension and fluid wave. There is no abdominal tenderness. A hernia is present. Hernia confirmed positive in the ventral area.  Neurological: He is alert.  Skin: Skin is warm and dry.  Psychiatric: He has a normal mood and affect.   CBC Latest Ref Rng & Units 04/15/2019 04/14/2019 04/14/2019  WBC 4.0 - 10.5 K/uL - 12.0(H) 12.0(H)  Hemoglobin 13.0 - 17.0 g/dL 7.5(L) 5.8(LL) 7.2(L)  Hematocrit 39.0 - 52.0 % 22.7(L) 17.9(L) 22.7(L)  Platelets 150 - 400 K/uL - 189 221   INR:  04/07 1.5 04/06 1.4 04/05 1.4  Assessment/Plan:  Principal Problem:   Acute GI  bleeding Active Problems:   Symptomatic anemia   Hepatic cirrhosis (HCC)   Hepatic encephalopathy ([place])  Summary:  Gavin Arroyo is a 64 year old male with a history of cirrhosis and large ventral hernia admitted for persistent GI bleeding on hospital day 16.   Acute GI bleeding, symptomatic anemia: Hgb dropped yesterday evening to 5.8 and that patient was transfused with 2 U pRBC to a post transfusion Hgb of 7.5. His INR has increased today to 1.5 from 1.4 where it had remained stable for about a week. He continues to have multiple loose stools daily that are now composed entirely of blood. Will check CBC this afternoon given the quantity of frank blood he is excreting in his stool. He remains asymptomatic with no shortness of breath, dizziness, or chest pain. He has been accepted for transfer to [facility] for double balloon endoscopy and is awaiting transfer later this week.  - continue protonix - continue sucralfate - trend CBC, with CBC this afternoon  - transfuse if HGB<7; f/u post transfusion H&H  Cirrhosis, Encephalopathy:  No evidence of current encephalopathy. Restarted spironolactone and lasix yesterday in setting of clinical evidence of ascites on exam and maintained MAPs in the 70s. Today his distention has decreased since yesterday, but there is  still a fluid wave present on exam. He had some low MAPs yesterday evening around the time he was getting transfused. MAPs are now in the upper 60s to low 70s.  - continue spironolactone and lasix - monitor blood pressure and MAPs closely  - continue lactulose - continue midodrine      LOS: 16 days   Mikael Spray, Medical Student 04/15/2019, 6:41 AM

## 2019-04-15 NOTE — Progress Notes (Signed)
Clinical update given to [facility] transfer center.  Was told they will call us again when a bed is available.

## 2019-04-15 NOTE — Progress Notes (Signed)
[facility] Gastroenterology Progress Note  Gavin Arroyo 64 y.o. [birth year]  CC:  GI bleed, anemia    Subjective: Patient seen and examined at bedside.  Continues to have maroon-colored stool.  Multiple episodes of bleeding yesterday.  Drop in hemoglobin to 5.8.  Denies abdominal pain, nausea and vomiting.  ROS : Afebrile.  Negative for chest pain, complaining of fatigue and weakness   Objective: Vital signs in last 24 hours: Vitals:   04/15/19 0355 04/15/19 0845  BP: (!) 96/54 (!) 92/55  Pulse: 81 75  Resp: 16 18  Temp: 98.7 F (37.1 C) 98.2 F (36.8 C)  SpO2: 100% 99%    Physical Exam:  General.  Well developed.  Not in acute distress HEENT.  Normocephalic atraumatic Abdomen.  Large ventral hernia noted.  Abdomen is soft, nondistended, bowel sounds present Psych.  Mood and affect normal. Neuro.  Alert/oriented x3   Lab Results: Recent Labs    04/13/19 0357  NA 139  K 3.3*  CL 108  CO2 23  GLUCOSE 118*  BUN 9  CREATININE 0.80  CALCIUM 7.8*   Recent Labs    04/13/19 0357  AST 50*  ALT 32  ALKPHOS 72  BILITOT 0.7  PROT 4.0*  ALBUMIN 1.6*   Recent Labs    04/14/19 0059 04/14/19 0059 04/14/19 1537 04/15/19 0223  WBC 12.0*  --  12.0*  --   HGB 7.2*   < > 5.8* 7.5*  HCT 22.7*   < > 17.9* 22.7*  MCV 96.2  --  96.2  --   PLT 221  --  189  --    < > = values in this interval not displayed.   Recent Labs    04/14/19 0059 04/15/19 0223  LABPROT 17.1* 17.7*  INR 1.4* 1.5*      Assessment/Plan: -Recurrent blood loss anemia. .  Capsule endoscopy showed diffuse bleeding from mid to distal small bowel all the way up to cecum - Large ventral hernia -Acute on chronic blood loss anemia.   GI work-up so far Initially seen by GI on March 14, 2019 for hematochezia.  Colonoscopy March 16, 2019 showed multiple small and large colonic polyps.  EGD showed grade 1 esophageal varices and portal hypertensive gastropathy.  Because of ongoing bleeding repeat  colonoscopy was done on March 20, 2019.  Colonoscopy was advanced up to transverse colon.  Was found to have ulcerated mucosa from possible prior polypectomy.  1 clip was placed at transverse colon.  EGD April 02, 2019 showed small esophageal varices, portal hypertensive gastropathy and scattered hematin in the gastric body.  Another colonoscopy April 07, 2019 showed scattered old blood throughout the colon.  Bleeding scan - April 08, 2019.  EGD April 12, 2019 showed similar finding.   Recommendations ----------------------- -Continue supportive care for now.   -Transfuse as needed to keep hemoglobin between 7 and 8 -Awaiting transfer to [facility] for double-balloon enteroscopy. -GI will follow  Otis Brace MD, [place] 04/15/2019, 9:24 AM  Contact #  [phone]

## 2019-04-15 NOTE — Progress Notes (Signed)
CRITICAL VALUE ALERT  Critical Value:  Hbg 6.7  Date & Time Notied:  04/15/19 @ 13:42  Provider Notified: Paged Dr. Sheppard Coil @ 13:49  Orders Received/Actions taken: Returned call @ 13:50, will order some blood.

## 2019-04-15 NOTE — Progress Notes (Signed)
Blood bank call, stating they needed an order for a type and screen for the unit of blood ordered.  Paged Dr. Sheppard Coil, return called received, will place the order.  Will continue to monitor.  Alphonzo Lemmings, RN

## 2019-04-15 NOTE — Progress Notes (Signed)
CSW received email from pts. Lawyer stating that pt's new court date is July 21, 2019 at 9:30 a.m. in Courtroom 1A of the [place] in [place], [place]. CSW informed pt. And instructions will also be included in discharge paperwork.

## 2019-04-16 LAB — COMPREHENSIVE METABOLIC PANEL
ALT: 26 U/L (ref 0–44)
AST: 44 U/L — ABNORMAL HIGH (ref 15–41)
Albumin: 1.8 g/dL — ABNORMAL LOW (ref 3.5–5.0)
Alkaline Phosphatase: 89 U/L (ref 38–126)
Anion gap: 12 (ref 5–15)
BUN: 16 mg/dL (ref 8–23)
CO2: 21 mmol/L — ABNORMAL LOW (ref 22–32)
Calcium: 8.1 mg/dL — ABNORMAL LOW (ref 8.9–10.3)
Chloride: 105 mmol/L (ref 98–111)
Creatinine, Ser: 1.1 mg/dL (ref 0.61–1.24)
GFR calc Af Amer: >60 mL/min (ref >60)
GFR calc non Af Amer: >60 mL/min (ref >60)
Glucose, Bld: 129 mg/dL — ABNORMAL HIGH (ref 70–99)
Potassium: 3.5 mmol/L (ref 3.5–5.1)
Sodium: 138 mmol/L (ref 135–145)
Total Bilirubin: 1.1 mg/dL (ref 0.3–1.2)
Total Protein: 4.5 g/dL — ABNORMAL LOW (ref 6.5–8.1)

## 2019-04-16 LAB — CBC
HCT: 21.5 % — ABNORMAL LOW (ref 39.0–52.0)
HCT: 27.7 % — ABNORMAL LOW (ref 39.0–52.0)
HCT: 23.6 % — ABNORMAL LOW (ref 39.0–52.0)
Hemoglobin: 6.8 g/dL — CL (ref 13.0–17.0)
Hemoglobin: 9.1 g/dL — ABNORMAL LOW (ref 13.0–17.0)
Hemoglobin: 7.7 g/dL — ABNORMAL LOW (ref 13.0–17.0)
MCH: 30.6 pg (ref 26.0–34.0)
MCH: 31.2 pg (ref 26.0–34.0)
MCH: 31.4 pg (ref 26.0–34.0)
MCHC: 31.6 g/dL (ref 30.0–36.0)
MCHC: 32.9 g/dL (ref 30.0–36.0)
MCHC: 32.6 g/dL (ref 30.0–36.0)
MCV: 96.8 fL (ref 80.0–100.0)
MCV: 94.9 fL (ref 80.0–100.0)
MCV: 96.3 fL (ref 80.0–100.0)
Platelets: 148 10*3/uL — ABNORMAL LOW (ref 150–400)
Platelets: 187 10*3/uL (ref 150–400)
Platelets: 172 10*3/uL (ref 150–400)
RBC: 2.22 MIL/uL — ABNORMAL LOW (ref 4.22–5.81)
RBC: 2.92 MIL/uL — ABNORMAL LOW (ref 4.22–5.81)
RBC: 2.45 MIL/uL — ABNORMAL LOW (ref 4.22–5.81)
RDW: 16.9 % — ABNORMAL HIGH (ref 11.5–15.5)
RDW: 16.7 % — ABNORMAL HIGH (ref 11.5–15.5)
RDW: 17.2 % — ABNORMAL HIGH (ref 11.5–15.5)
WBC: 7.4 10*3/uL (ref 4.0–10.5)
WBC: 8.7 10*3/uL (ref 4.0–10.5)
WBC: 6.8 10*3/uL (ref 4.0–10.5)
nRBC: 0 % (ref 0.0–0.2)
nRBC: 0 % (ref 0.0–0.2)
nRBC: 0 % (ref 0.0–0.2)

## 2019-04-16 LAB — HEMOGLOBIN AND HEMATOCRIT, BLOOD
HCT: 22 % — ABNORMAL LOW (ref 39.0–52.0)
Hemoglobin: 7.2 g/dL — ABNORMAL LOW (ref 13.0–17.0)

## 2019-04-16 LAB — PREPARE RBC (CROSSMATCH)

## 2019-04-16 MED ORDER — SODIUM CHLORIDE 0.9% IV SOLUTION: Freq: Once | INTRAVENOUS | Status: AC

## 2019-04-16 NOTE — Progress Notes (Signed)
[facility] Gastroenterology Progress Note  ABRAR LYNDE 64 y.o. July 11, 1955  CC:  GI bleed, anemia    Subjective: Patient seen and examined at bedside.  Continues to have intermittent rectal bleeding with maroon-colored stool.  Drop in hemoglobin noted.  Requiring blood transfusion almost on a daily basis  ROS : Afebrile.  Negative for chest pain, complaining of fatigue and weakness   Objective: Vital signs in last 24 hours: Vitals:   04/16/19 0317 04/16/19 0800  BP: (!) 99/54   Pulse: 64   Resp: 17   Temp: 98.1 F (36.7 C) 97.9 F (36.6 C)  SpO2: 98%     Physical Exam:  General.  Well developed.  Not in acute distress HEENT.  Normocephalic atraumatic Abdomen.  Large ventral hernia noted.  Abdomen is soft, nondistended, bowel sounds present Psych.  Mood and affect normal. Neuro.  Alert/oriented x3   Lab Results: No results for input(s): NA, K, CL, CO2, GLUCOSE, BUN, CREATININE, CALCIUM, MG, PHOS in the last 72 hours. No results for input(s): AST, ALT, ALKPHOS, BILITOT, PROT, ALBUMIN in the last 72 hours. Recent Labs    04/15/19 1206 04/15/19 2301 04/16/19 0210 04/16/19 0643  WBC 11.8*  --   --  7.4  HGB 6.7*   < > 7.2* 6.8*  HCT 20.1*   < > 22.0* 21.5*  MCV 94.8  --   --  96.8  PLT 165  --   --  148*   < > = values in this interval not displayed.   Recent Labs    04/14/19 0059 04/15/19 0223  LABPROT 17.1* 17.7*  INR 1.4* 1.5*      Assessment/Plan: -Recurrent blood loss anemia. .  Capsule endoscopy showed diffuse bleeding from mid to distal small bowel all the way up to cecum - Large ventral hernia -Acute on chronic blood loss anemia.  Requiring multiple units of blood transfusion   GI work-up so far Initially seen by GI on March 14, 2019 for hematochezia.  Colonoscopy March 16, 2019 showed multiple small and large colonic polyps.  EGD showed grade 1 esophageal varices and portal hypertensive gastropathy.  Because of ongoing bleeding repeat colonoscopy was  done on March 20, 2019.  Colonoscopy was advanced up to transverse colon.  Was found to have ulcerated mucosa from possible prior polypectomy.  1 clip was placed at transverse colon.  EGD April 02, 2019 showed small esophageal varices, portal hypertensive gastropathy and scattered hematin in the gastric body.  Another colonoscopy April 07, 2019 showed scattered old blood throughout the colon.  Bleeding scan - April 08, 2019.  EGD April 12, 2019 showed similar finding.   Recommendations ----------------------- -Discussed with [facility] transfer coordinator again today.  Ongoing drop in hemoglobin and requiring repeated blood transfusion discussed.  -Their initial plan was to transfer patient to [place]  over the weekend to do the double-balloon enteroscopy early next week as they do not do double-balloon enteroscopy during the weekends.  -With his ongoing bleeding, I have recommended that they reach out to their provider again today to see if they can consider doing double-balloon enteroscopy for tomorrow.  -Continue supportive care.  Transfuse to keep hemoglobin between 7 and 8. -GI will follow  Otis Brace MD, [place] 04/16/2019, 8:59 AM  Contact #  [phone]

## 2019-04-16 NOTE — Progress Notes (Signed)
CRITICAL VALUE ALERT  Critical Value:  hgb 6.8   Date & Time Notied:  04/16/2019 0750  Provider Notified: MD notified   Orders Received/Actions taken: awaiting orders

## 2019-04-16 NOTE — Progress Notes (Signed)
Patient ID: Gavin Arroyo, male   DOB: 1955/03/28, 64 y.o.   MRN: [id number]   Subjective: HD: 17   Overnight: Low MAPs 55-60s, have normalized this morning  Gavin Arroyo was seen this morning on rounds and reports feeling the same as he has for the past few days. He has not experienced any chest pain, shortness of breath, dizziness, or abdominal pain. He has had one bowel movement since yesterday that was hard and dark in color. He states that he is ready to leave the hospital and is hoping to get transferred soon.  Objective:  Vital signs in last 24 hours: Vitals:   04/15/19 2344 04/16/19 0030 04/16/19 0317 04/16/19 0607  BP: (!) 82/41 (!) 83/49 (!) 99/54   Pulse: 66  64   Resp: 17  17   Temp: 97.9 F (36.6 C)  98.1 F (36.7 C)   TempSrc: Oral  Oral   SpO2: 97%  98%   Weight:    69 kg  Height:       Weight change: -1.2 kg  Intake/Output Summary (Last 24 hours) at 04/16/2019 0731 Last data filed at 04/16/2019 0600 Gross per 24 hour  Intake 606.67 ml  Output 800 ml  Net -193.33 ml   Physical Exam  Cardiovascular: Normal rate, regular rhythm and normal heart sounds.  Pulses:      Radial pulses are 2+ on the right side.  Respiratory: Effort normal and breath sounds normal.  GI: Bowel sounds are normal. He exhibits distension and fluid wave. There is no abdominal tenderness. A hernia is present. Hernia confirmed positive in the ventral area.   CBC Latest Ref Rng & Units 04/16/2019 04/16/2019 04/15/2019  WBC 4.0 - 10.5 K/uL 7.4 - -  Hemoglobin 13.0 - 17.0 g/dL 6.8(LL) 7.2(L) 7.2(L)  Hematocrit 39.0 - 52.0 % 21.5(L) 22.0(L) 22.1(L)  Platelets 150 - 400 K/uL 148(L) - -   CMP Latest Ref Rng & Units 04/16/2019 04/13/2019 04/12/2019  Glucose 70 - 99 mg/dL 129(H) 118(H) 128(H)  BUN 8 - 23 mg/dL 16 9 9   Creatinine 0.61 - 1.24 mg/dL 1.10 0.80 0.79  Sodium 135 - 145 mmol/L 138 139 136  Potassium 3.5 - 5.1 mmol/L 3.5 3.3(L) 3.6  Chloride 98 - 111 mmol/L 105 108 108  CO2 22 - 32 mmol/L 21(L) 23  21(L)  Calcium 8.9 - 10.3 mg/dL 8.1(L) 7.8(L) 7.7(L)  Total Protein 6.5 - 8.1 g/dL 4.5(L) 4.0(L) 4.1(L)  Total Bilirubin 0.3 - 1.2 mg/dL 1.1 0.7 0.9  Alkaline Phos 38 - 126 U/L 89 72 74  AST 15 - 41 U/L 44(H) 50(H) 51(H)  ALT 0 - 44 U/L 26 32 34   Assessment/Plan:  Principal Problem:   Acute GI bleeding Active Problems:   Symptomatic anemia   Hepatic cirrhosis (HCC)   Hepatic encephalopathy (HCC)  Summary:  Gavin Arroyo is a 64 year old male with a history of cirrhosis and large ventral hernia admitted for acute hepatic encephalopathy in the setting of Ativan that has since resolved and ongoing persistent GI bleed on HD 17.   Acute GI bleeding, symptomatic anemia:  The patient's Hgb dropped to 6.7 yesterday afternoon. He received 1 U pRBC, which brought is Hgb up to 7.2. Hgb this morning dropped to 6.8 and required another transfusion this morning. His stools since yesterday have been melanotic. He has been accepted for transfer to [facility] for double balloon enteroscopy and should be transferred this week. Original plan was for transfer this weekend and  perform double balloon enteroscopy early next week. Given ongoing transfusion requirements and active bleeding GI contacted [facility] and requested they consider performing this procedure tomorrow. GI is following; we appreciate all recommendations.   - awaiting follow up from [facility] about transfer - receiving 1 U pRBC this AM; transfuse if Hgb < 7; f/u post transfusion H&H - CBC Q8H - continue protonix - continue sucralfate  Cirrhosis, encephalopathy: Encephalopathy resolved with lactulose therapy. On exam his abdomen was still distended and fluid wave was present. The patient has remained afebrile, and WBC count has normalized to 7.4. The patient has had low MAPs in the setting of ongoing GI bleeding, however he has remained asymptomatic. Will continue diuretics given that his drop in MAP only occurs overnight, likely when he is sleeping, and his  renal function has been normal and he has remained asymptomatic. All of which provide evidence that he is perfusing is organs well.   - continue spironolactone and lasix; will watch bp closely  - continue lactulose - continue midodrine      LOS: 17 days   Mikael Spray, Medical Student 04/16/2019, 7:31 AM

## 2019-04-16 NOTE — Discharge Summary (Signed)
Name: Gavin Arroyo MRN: [id number] DOB: 03-15-1955 64 y.o. PCP: Patient, No Pcp Per  Date of Admission: 03/30/2019 12:36 AM Date of Discharge: 04/17/2019 Attending Physician: Velna Ochs, MD  Discharge Diagnosis: 1. Acute on Chronic GI Bleed 2. Acute Hepatic Encephalopathy 3. Ventral Hernia  Discharge Medications: Allergies as of 04/17/2019   No Known Allergies     Medication List    TAKE these medications   acetaminophen 325 MG tablet Commonly known as: TYLENOL Take 2 tablets (650 mg total) by mouth every 6 (six) hours as needed for mild pain (or Fever >/= 101).   acetaminophen 650 MG suppository Commonly known as: TYLENOL Place 1 suppository (650 mg total) rectally every 6 (six) hours as needed for mild pain (or Fever >/= 101).   chlorhexidine 0.12 % solution Commonly known as: PERIDEX 15 mLs by Mouth Rinse route 2 (two) times daily.   feeding supplement (ENSURE ENLIVE) Liqd Take 237 mLs by mouth 2 (two) times daily between meals. Start taking on: April 18, 2019   feeding supplement (PRO-STAT SUGAR FREE 64) Liqd Take 30 mLs by mouth 3 (three) times daily.   folic acid 1 MG tablet Commonly known as: FOLVITE Take 1 tablet (1 mg total) by mouth daily. Start taking on: April 18, 2019   furosemide 40 MG tablet Commonly known as: LASIX Take 1 tablet (40 mg total) by mouth daily.   lactulose 10 GM/15ML solution Commonly known as: CHRONULAC Take 45 mLs (30 g total) by mouth 3 (three) times daily.   midodrine 10 MG tablet Commonly known as: PROAMATINE Take 1 tablet (10 mg total) by mouth 3 (three) times daily with meals.   mouth rinse Liqd solution 15 mLs by Mouth Rinse route 2 times daily at 12 noon and 4 pm.   multivitamin with minerals Tabs tablet Take 1 tablet by mouth daily. Start taking on: April 18, 2019   pantoprazole 40 MG tablet Commonly known as: PROTONIX Take 1 tablet (40 mg total) by mouth 2 (two) times daily. What changed: when to take  this   polyvinyl alcohol 1.4 % ophthalmic solution Commonly known as: LIQUIFILM TEARS Place 1 drop into both eyes as needed for dry eyes.   promethazine 12.5 MG tablet Commonly known as: PHENERGAN Take 1 tablet (12.5 mg total) by mouth every 6 (six) hours as needed for nausea.   senna-docusate 8.6-50 MG tablet Commonly known as: Senokot-S Take 1 tablet by mouth at bedtime as needed for mild constipation.   sodium chloride 0.65 % Soln nasal spray Commonly known as: OCEAN Place 1 spray into both nostrils as needed for congestion (nose irritation).   spironolactone 100 MG tablet Commonly known as: ALDACTONE Take 1 tablet (100 mg total) by mouth daily.   sucralfate 1 GM/10ML suspension Commonly known as: CARAFATE Take 10 mLs (1 g total) by mouth 4 (four) times daily -  with meals and at bedtime.   thiamine 100 MG tablet Take 1 tablet (100 mg total) by mouth daily. Start taking on: April 18, 2019            Durable Medical Equipment  (From admission, onward)         Start     Ordered   04/10/19 1116  For home use only DME 3 n 1  Once    Comments: 5 in wheels   04/10/19 1116         Disposition and follow-up:   Gavin Arroyo was transferred to [facility] for double balloon enteroscopy procedure.  Follow-up Appointments: Follow-up Information    [facility] Follow up.   Specialty: Home Health Services Why: physical and occupational therapy Contact information: [address] [place] [address] [phone]        [place] 1[address]. Go on 07/21/2019.   Why: Your lawyer has rescheduled your courtdate. Your new courtdate is Tuesday, July 21, 2019 at 9:30 a.m. in Courtroom 1A of [place] in [place] information: [place], [place] Hospital Course by problem list: 1. Acute on Chronic GI bleed: In summary, Gavin Arroyo is a 64 year  old male with a history of compensated liver cirrhosis, non-bleeding grade 1 esophageal varices, portal hypertensive gastropathy, and large ventral hernia who presented with acute hepatic encephalopathy and persistent GI bleeding. He had recently been admitted with GI bleeding 03/14/19-03/18/19 and 03/19/19-03/28/19. In total, he has received 34 units of blood.   During his first admission he had and EGD which showed grade 1 esophageal varices and portal hypertensive gastropathy . He also had a colonoscopy which showed 3 polyps in the transverse colon, 2 in the ascending colon, and 1 in the sigmoid colon, all of which were removed.    Gavin Arroyo was then Arroyo on 03/11 with melena and symptomatic anemia. During this stay he had a colonoscopy that revealed ulcerations at the site of recent polypectomies. His hemoglobin stabilized after multiple blood transfusions and melanotic stools improved and he was discharged with follow up 2 days later.   On 03/22 Gavin Arroyo after being transferred from [facility] due to continued melena and hepatic encephalopathy. He received 2 EGDs which showed oozing portal hypertensive gastropathy and small non-bleeding esophageal varices. He also received a colonoscopy which showed blood throughout the entire length of the colon without evidence of active bleeding. A tagged RBC study was also performed with negative results. During the second EGD a capsule endoscopy was released which revealed bleeding in mid and distal small bowel. GI recommended immediate transfer to tertiary care center for double balloon enteroscopy. He has been accepted at [facility] for transfer on 4/9.  In total he received 34 U pRBC over the month of March.  2. Hepatic encephalopathy  On admission 03/22 Gavin Arroyo presented with confusion secondary to hepatic encephalopathy. Lactulose therapy was started and his cognition improved to baseline.   3. Ventral hernia: Gavin Arroyo has a  longstanding history of large ventral hernia. There is no evidence of incarceration or strangulation. Pt is having no abdominal pain and regular bowel movements Surgical intervention is not recommended at this time.   Discharge Vitals:   BP (!) 96/53 (BP Location: Left Arm)   Pulse 64   Temp 98.2 F (36.8 C) (Oral)   Resp 18   Ht 5\' 10"  (1.778 m)   Wt 69 kg   SpO2 99%   BMI 21.83 kg/m   Pertinent Labs, Studies, and Procedures:  CBC Latest Ref Rng & Units 04/16/2019 04/16/2019 04/15/2019  WBC 4.0 - 10.5 K/uL 7.4 - -  Hemoglobin 13.0 - 17.0 g/dL 6.8(LL) 7.2(L) 7.2(L)  Hematocrit 39.0 - 52.0 % 21.5(L) 22.0(L) 22.1(L)  Platelets 150 - 400 K/uL 148(L) - -   CMP Latest Ref Rng & Units 04/16/2019 04/13/2019 04/12/2019  Glucose 70 - 99 mg/dL 129(H) 118(H) 128(H)  BUN 8 - 23 mg/dL 16 9 9  Creatinine 0.61 - 1.24 mg/dL 1.10 0.80 0.79  Sodium 135 - 145 mmol/L 138 139 136  Potassium 3.5 - 5.1 mmol/L 3.5 3.3(L) 3.6  Chloride 98 - 111 mmol/L 105 108 108  CO2 22 - 32 mmol/L 21(L) 23 21(L)  Calcium 8.9 - 10.3 mg/dL 8.1(L) 7.8(L) 7.7(L)  Total Protein 6.5 - 8.1 g/dL 4.5(L) 4.0(L) 4.1(L)  Total Bilirubin 0.3 - 1.2 mg/dL 1.1 0.7 0.9  Alkaline Phos 38 - 126 U/L 89 72 74  AST 15 - 41 U/L 44(H) 50(H) 51(H)  ALT 0 - 44 U/L 26 32 34   Tagged RBC:  No evidence of abnormal radiotracer activity in the abdomen or pelvis suggestive of intraluminal excretion. Normal red blood cell activity seen in liver, spleen and vessels.  Capsule Endoscopy: Small flecks of blood in the proximal small bowel without obvious AVMs, but likely from erosions or mucosal oozing. Large amount of fresh blood compatible with a mid and distal small bowel bleeding.   Signed: Mikael Spray, Medical Student 04/16/2019, 11:22 AM   Pager: [phone]  Attestation for Student Documentation:  I personally was present and performed or re-performed the history, physical exam and medical decision-making activities of this service and have  verified that the service and findings are accurately documented in the student's note.  Earlene Plater, MD Internal Medicine, PGY1 Pager: [phone]  04/17/2019,4:35 PM

## 2019-04-17 LAB — CBC
HCT: 23.5 % — ABNORMAL LOW (ref 39.0–52.0)
HCT: 22.4 % — ABNORMAL LOW (ref 39.0–52.0)
Hemoglobin: 7.6 g/dL — ABNORMAL LOW (ref 13.0–17.0)
Hemoglobin: 7.1 g/dL — ABNORMAL LOW (ref 13.0–17.0)
MCH: 31.5 pg (ref 26.0–34.0)
MCH: 31.1 pg (ref 26.0–34.0)
MCHC: 32.3 g/dL (ref 30.0–36.0)
MCHC: 31.7 g/dL (ref 30.0–36.0)
MCV: 97.5 fL (ref 80.0–100.0)
MCV: 98.2 fL (ref 80.0–100.0)
Platelets: 162 10*3/uL (ref 150–400)
Platelets: 184 10*3/uL (ref 150–400)
RBC: 2.41 MIL/uL — ABNORMAL LOW (ref 4.22–5.81)
RBC: 2.28 MIL/uL — ABNORMAL LOW (ref 4.22–5.81)
RDW: 17.3 % — ABNORMAL HIGH (ref 11.5–15.5)
RDW: 17.2 % — ABNORMAL HIGH (ref 11.5–15.5)
WBC: 5.9 10*3/uL (ref 4.0–10.5)
WBC: 7.7 10*3/uL (ref 4.0–10.5)
nRBC: 0 % (ref 0.0–0.2)
nRBC: 0 % (ref 0.0–0.2)

## 2019-04-17 LAB — PREPARE RBC (CROSSMATCH)

## 2019-04-17 MED ORDER — POLYVINYL ALCOHOL 1.4 % OP SOLN: 1.0000 [drp] | OPHTHALMIC | 0 refills | Status: DC | PRN

## 2019-04-17 MED ORDER — FOLIC ACID 1 MG PO TABS: 1.0000 mg | ORAL_TABLET | Freq: Every day | ORAL | Status: DC

## 2019-04-17 MED ORDER — CHLORHEXIDINE GLUCONATE 0.12 % MT SOLN: 15.0000 mL | Freq: Two times a day (BID) | OROMUCOSAL | 0 refills | Status: DC

## 2019-04-17 MED ORDER — ACETAMINOPHEN 325 MG PO TABS: 650.0000 mg | ORAL_TABLET | Freq: Four times a day (QID) | ORAL | Status: DC | PRN

## 2019-04-17 MED ORDER — THIAMINE HCL 100 MG PO TABS: 100.0000 mg | ORAL_TABLET | Freq: Every day | ORAL | Status: DC

## 2019-04-17 MED ORDER — SODIUM CHLORIDE 0.9% IV SOLUTION: Freq: Once | INTRAVENOUS | Status: AC

## 2019-04-17 MED ORDER — PANTOPRAZOLE SODIUM 40 MG PO TBEC: 40.0000 mg | DELAYED_RELEASE_TABLET | Freq: Two times a day (BID) | ORAL | Status: DC

## 2019-04-17 MED ORDER — SALINE SPRAY 0.65 % NA SOLN: 1.0000 | NASAL | 0 refills | Status: DC | PRN

## 2019-04-17 MED ORDER — ENSURE ENLIVE PO LIQD: 237.0000 mL | Freq: Two times a day (BID) | ORAL | 12 refills | Status: DC

## 2019-04-17 MED ORDER — PROMETHAZINE HCL 12.5 MG PO TABS: 12.5000 mg | ORAL_TABLET | Freq: Four times a day (QID) | ORAL | 0 refills | Status: DC | PRN

## 2019-04-17 MED ORDER — SENNOSIDES-DOCUSATE SODIUM 8.6-50 MG PO TABS: 1.0000 | ORAL_TABLET | Freq: Every evening | ORAL | Status: DC | PRN

## 2019-04-17 MED ORDER — PRO-STAT SUGAR FREE PO LIQD: 30.0000 mL | Freq: Three times a day (TID) | ORAL | 0 refills | Status: DC

## 2019-04-17 MED ORDER — LACTULOSE 10 GM/15ML PO SOLN: 30.0000 g | Freq: Three times a day (TID) | ORAL | 0 refills | Status: DC

## 2019-04-17 MED ORDER — ACETAMINOPHEN 650 MG RE SUPP: 650.0000 mg | Freq: Four times a day (QID) | RECTAL | 0 refills | Status: DC | PRN

## 2019-04-17 MED ORDER — MIDODRINE HCL 10 MG PO TABS: 10.0000 mg | ORAL_TABLET | Freq: Three times a day (TID) | ORAL | Status: DC

## 2019-04-17 MED ORDER — ORAL CARE MOUTH RINSE: 15.0000 mL | Freq: Two times a day (BID) | OROMUCOSAL | 0 refills | Status: DC

## 2019-04-17 MED ORDER — SUCRALFATE 1 GM/10ML PO SUSP: 1.0000 g | Freq: Three times a day (TID) | ORAL | 0 refills | Status: DC

## 2019-04-17 MED ORDER — ADULT MULTIVITAMIN W/MINERALS CH: 1.0000 | ORAL_TABLET | Freq: Every day | ORAL | Status: AC

## 2019-04-17 NOTE — Progress Notes (Signed)
Patient ID: Gavin Arroyo, male   DOB: 1955/04/26, 64 y.o.   MRN: [id number]   Subjective: HD: [place] was seen this morning on rounds and was talking animatedly on the phone when we entered. He reports feeling well and has no complaints. He states his bowel movements yesterday were brown without any evidence of blood, and that he has not had any movements today. We discussed is upcoming transfer this morning and Gavin Arroyo expressed excitement at the idea of being transferred and hopefully returning home sometime next week.    Objective:  Vital signs in last 24 hours: Vitals:   04/16/19 1225 04/16/19 1708 04/17/19 0038 04/17/19 0619  BP: (!) 113/59 (!) 107/59 (!) 102/56 106/62  Pulse: 65 65 76 77  Resp: 18  18 18   Temp: 97.9 F (36.6 C)  98.3 F (36.8 C) 98.6 F (37 C)  TempSrc: Oral  Oral Oral  SpO2: 100% 100% 100% 100%  Weight:      Height:       Weight change:   Intake/Output Summary (Last 24 hours) at 04/17/2019 0634 Last data filed at 04/17/2019 0622 Gross per 24 hour  Intake 1064 ml  Output --  Net 1064 ml   Physical Exam  Cardiovascular: Normal rate, regular rhythm, normal heart sounds and intact distal pulses.  Respiratory: Effort normal and breath sounds normal.  GI: Bowel sounds are normal. He exhibits distension and fluid wave. There is no abdominal tenderness. A hernia is present. Hernia confirmed positive in the ventral area.  Neurological: He is alert.  Skin: Skin is warm and dry.   CBC Latest Ref Rng & Units 04/17/2019 04/16/2019 04/16/2019  WBC 4.0 - 10.5 K/uL 5.9 6.8 8.7  Hemoglobin 13.0 - 17.0 g/dL 7.6(L) 7.7(L) 9.1(L)  Hematocrit 39.0 - 52.0 % 23.5(L) 23.6(L) 27.7(L)  Platelets 150 - 400 K/uL 162 172 187    Assessment/Plan:  Principal Problem:   Acute GI bleeding Active Problems:   Symptomatic anemia   Hepatic cirrhosis (HCC)   Hepatic encephalopathy (HCC)  Summary:  Gavin Arroyo is a 64 year old male with a history of cirrhosis and a large ventral  hernia who is admitted for acute hepatic encephalopathy that has since resolved and ongoing persistent GI bleeding on HD 18.   Acute GI bleeding, symptomatic anemia:  Hgb rose to 9.1 from 6.8 after transfusion yesterday, then dropped to 7.7 in the evening. This morning his Hgb is at 7.6. He has been accepted for transfer to [facility] and I spoke with their transfer team yesterday. They plan to transfer him either today or tomorrow and plan to do double balloon enteroscopy early next week.   - transfer to [place] today or tomorrow; will contact today [facility] today to follow up on upcoming trnasfer - transfuse if Hgb is < 7; f/u with post transfusion H&H  - continue protonix - continue sucralfate  Hepatic cirrhosis, encephalopathy: Encephalopathy in setting of ativan has since resolve with lactulose therapy. There continues to be evidence of ascites on physical exam. He remains afebrile without leukocytosis or abdominal pain. MAPs and BP have remained stable.   - continue spironolactone and lasix - continue midodrine - continue lactulose  Diet: 2 g sodium  Code: full  VTE ppx: SCD, no lovenox due to GI bleed Dispo: pending ongoing transfer to [facility]; anticipated in next 48 hours Discharge location: transfer to [facility]; then home with [facility] for PT and OT    LOS: 18 days  Mikael Spray, Medical Student 04/17/2019, 6:34 AM

## 2019-04-17 NOTE — Progress Notes (Signed)
Pt will be transferring to [facility] for a GI procedure. Report called and given to RN on 4300 unit. PTAR set up to transfer Pt.

## 2019-04-17 NOTE — Progress Notes (Signed)
Call received from Cathlean Marseilles at [facility] stating that pt. had a bed today.  Pt. will be going to room 4319 at the [facility] and receiving MD will be Dr.Shelia Wonda Cheng.  Paged Dr. Sheppard Coil [phone] to inform, will await for a return call.    Alphonzo Lemmings, RN

## 2019-04-17 NOTE — Progress Notes (Signed)
[facility] Gastroenterology Progress Note  Gavin Arroyo 64 y.o. 05-Jul-1955  CC:  GI bleed, anemia    Subjective: Patient seen and examined at bedside.  Having brown-colored stool this morning.  Hemoglobin stable.  No other GI issues.  Awaiting bed placement at [facility]   ROS : Afebrile.  Negative for chest pain,    Objective: Vital signs in last 24 hours: Vitals:   04/17/19 0038 04/17/19 0619  BP: (!) 102/56 106/62  Pulse: 76 77  Resp: 18 18  Temp: 98.3 F (36.8 C) 98.6 F (37 C)  SpO2: 100% 100%    Physical Exam:  General.  Well developed.  Not in acute distress HEENT.  Normocephalic atraumatic Abdomen.  Large ventral hernia noted.  Abdomen is soft, nondistended, bowel sounds present Psych.  Mood and affect normal. Neuro.  Alert/oriented x3   Lab Results: Recent Labs    04/16/19 0810  NA 138  K 3.5  CL 105  CO2 21*  GLUCOSE 129*  BUN 16  CREATININE 1.10  CALCIUM 8.1*   Recent Labs    04/16/19 0810  AST 44*  ALT 26  ALKPHOS 89  BILITOT 1.1  PROT 4.5*  ALBUMIN 1.8*   Recent Labs    04/16/19 2241 04/17/19 0610  WBC 6.8 5.9  HGB 7.7* 7.6*  HCT 23.6* 23.5*  MCV 96.3 97.5  PLT 172 162   Recent Labs    04/15/19 0223  LABPROT 17.7*  INR 1.5*      Assessment/Plan: -Recurrent blood loss anemia. .  Capsule endoscopy showed diffuse bleeding from mid to distal small bowel all the way up to cecum - Large ventral hernia -Acute on chronic blood loss anemia.  Requiring multiple ( > 30 ) units of blood transfusion   GI work-up so far Initially seen by GI on March 14, 2019 for hematochezia.  Colonoscopy March 16, 2019 showed multiple small and large colonic polyps.  EGD showed grade 1 esophageal varices and portal hypertensive gastropathy.  Because of ongoing bleeding repeat colonoscopy was done on March 20, 2019.  Colonoscopy was advanced up to transverse colon.  Was found to have ulcerated mucosa from possible prior polypectomy.  1 clip was placed at  transverse colon.  EGD April 02, 2019 showed small esophageal varices, portal hypertensive gastropathy and scattered hematin in the gastric body.  Another colonoscopy April 07, 2019 showed scattered old blood throughout the colon.  Bleeding scan - April 08, 2019.  EGD April 12, 2019 showed similar finding.   Recommendations ----------------------- -No further bleeding episodes.  Hemoglobin stable. -Awaiting transfer to [facility] -Continue supportive care.  Transfuse to keep hemoglobin between 7 and 8 -GI will follow  Otis Brace MD, [place] 04/17/2019, 10:22 AM  Contact #  [phone]

## 2019-04-18 LAB — BPAM RBC
Blood Product Expiration Date: 202105012359
Blood Product Expiration Date: 202105022359
Blood Product Expiration Date: 202105072359
ISSUE DATE / TIME: 202104071558
ISSUE DATE / TIME: 202104080922
ISSUE DATE / TIME: 202104091704
Unit Type and Rh: 6200
Unit Type and Rh: 6200
Unit Type and Rh: 6200

## 2019-04-18 LAB — TYPE AND SCREEN
ABO/RH(D): A POS
Antibody Screen: NEGATIVE
Unit division: 0
Unit division: 0
Unit division: 0

## 2019-04-18 MED ORDER — LACTULOSE 10 GM/15ML PO SOLN
30.00 | ORAL | Status: DC
Start: 2019-04-24 — End: 2019-04-18

## 2019-04-18 MED ORDER — MULTI-VITAMIN PO TABS
1.00 | ORAL_TABLET | ORAL | Status: DC
Start: 2019-05-02 — End: 2019-04-18

## 2019-04-18 MED ORDER — SUCRALFATE 1 GM/10ML PO SUSP
1.00 | ORAL | Status: DC
Start: 2019-05-01 — End: 2019-04-18

## 2019-04-18 MED ORDER — PANTOPRAZOLE SODIUM 40 MG PO TBEC
40.00 | DELAYED_RELEASE_TABLET | ORAL | Status: DC
Start: 2019-04-24 — End: 2019-04-18

## 2019-04-18 MED ORDER — FUROSEMIDE 40 MG PO TABS
40.00 | ORAL_TABLET | ORAL | Status: DC
Start: 2019-04-19 — End: 2019-04-18

## 2019-04-18 MED ORDER — SENNOSIDES-DOCUSATE SODIUM 8.6-50 MG PO TABS
2.00 | ORAL_TABLET | ORAL | Status: DC
Start: ? — End: 2019-04-18

## 2019-04-18 MED ORDER — RIFAXIMIN 550 MG PO TABS
550.00 | ORAL_TABLET | ORAL | Status: DC
Start: 2019-04-24 — End: 2019-04-18

## 2019-04-18 MED ORDER — FOLIC ACID 1 MG PO TABS
1.00 | ORAL_TABLET | ORAL | Status: DC
Start: 2019-05-02 — End: 2019-04-18

## 2019-04-18 MED ORDER — GENERIC EXTERNAL MEDICATION
Status: DC
Start: ? — End: 2019-04-18

## 2019-04-18 MED ORDER — CIPROFLOXACIN HCL 500 MG PO TABS
500.00 | ORAL_TABLET | ORAL | Status: DC
Start: 2019-04-25 — End: 2019-04-18

## 2019-04-18 MED ORDER — ACETAMINOPHEN 325 MG PO TABS
325.00 | ORAL_TABLET | ORAL | Status: DC
Start: ? — End: 2019-04-18

## 2019-04-18 MED ORDER — SPIRONOLACTONE 25 MG PO TABS
100.00 | ORAL_TABLET | ORAL | Status: DC
Start: 2019-04-19 — End: 2019-04-18

## 2019-04-18 MED ORDER — MELATONIN 3 MG PO TABS
3.00 | ORAL_TABLET | ORAL | Status: DC
Start: ? — End: 2019-04-18

## 2019-04-18 MED ORDER — LIDOCAINE HCL 1 % IJ SOLN
0.50 | INTRAMUSCULAR | Status: DC
Start: ? — End: 2019-04-18

## 2019-04-18 MED ORDER — THIAMINE HCL 100 MG PO TABS
100.00 | ORAL_TABLET | ORAL | Status: DC
Start: 2019-05-02 — End: 2019-04-18

## 2019-04-18 MED ORDER — GENERIC EXTERNAL MEDICATION
50.00 | Status: DC
Start: ? — End: 2019-04-18

## 2019-04-18 NOTE — Progress Notes (Signed)
Transportation picked up this patient for his transfer to [facility] room 4319. All of his belongings are packed and given to transport. Patient is alert and oriented and vital are stable.

## 2019-04-20 MED ORDER — GENERIC EXTERNAL MEDICATION
Status: DC
Start: ? — End: 2019-04-20

## 2019-04-22 MED ORDER — GENERIC EXTERNAL MEDICATION
Status: DC
Start: ? — End: 2019-04-22

## 2019-04-24 MED ORDER — GENERIC EXTERNAL MEDICATION
Status: DC
Start: ? — End: 2019-04-24

## 2019-05-01 MED ORDER — SENNOSIDES-DOCUSATE SODIUM 8.6-50 MG PO TABS
2.00 | ORAL_TABLET | ORAL | Status: DC
Start: 2019-05-01 — End: 2019-05-01

## 2019-05-01 MED ORDER — GLUCAGON (RDNA) 1 MG IJ KIT
1.00 | PACK | INTRAMUSCULAR | Status: DC
Start: ? — End: 2019-05-01

## 2019-05-01 MED ORDER — DEXTROSE 50 % IV SOLN
12.50 | INTRAVENOUS | Status: DC
Start: ? — End: 2019-05-01

## 2019-05-01 MED ORDER — GENERIC EXTERNAL MEDICATION
Status: DC
Start: ? — End: 2019-05-01

## 2019-05-01 MED ORDER — MIDODRINE HCL 5 MG PO TABS
10.00 | ORAL_TABLET | ORAL | Status: DC
Start: 2019-05-01 — End: 2019-05-01

## 2019-05-01 MED ORDER — ACETAMINOPHEN 325 MG PO TABS
650.00 | ORAL_TABLET | ORAL | Status: DC
Start: ? — End: 2019-05-01

## 2019-05-01 MED ORDER — LIDOCAINE HCL 1 % IJ SOLN
3.00 | INTRAMUSCULAR | Status: DC
Start: ? — End: 2019-05-01

## 2019-05-01 MED ORDER — GENERIC EXTERNAL MEDICATION
3.38 | Status: DC
Start: 2019-05-01 — End: 2019-05-01

## 2019-05-01 MED ORDER — PANTOPRAZOLE SODIUM 40 MG PO TBEC
40.00 | DELAYED_RELEASE_TABLET | ORAL | Status: DC
Start: 2019-05-01 — End: 2019-05-01

## 2019-05-01 MED ORDER — IPRATROPIUM-ALBUTEROL 0.5-2.5 (3) MG/3ML IN SOLN
3.00 | RESPIRATORY_TRACT | Status: DC
Start: ? — End: 2019-05-01

## 2019-05-12 NOTE — Addendum Note (Signed)
Addended by: Jean Rosenthal on: 05/12/2019 12:15 AM   Modules accepted: Orders

## 2019-05-29 ENCOUNTER — Emergency Department (HOSPITAL_COMMUNITY): Payer: Medicaid Other

## 2019-05-29 ENCOUNTER — Other Ambulatory Visit: Payer: Self-pay

## 2019-05-29 ENCOUNTER — Inpatient Hospital Stay (HOSPITAL_COMMUNITY)
Admission: EM | Admit: 2019-05-29 | Discharge: 2019-06-02 | DRG: 378 | Disposition: A | Discharge status: Home / Self Care | Payer: Medicaid Other | Attending: Infectious Disease | Admitting: Infectious Disease

## 2019-05-29 ENCOUNTER — Encounter (HOSPITAL_COMMUNITY): Payer: Self-pay

## 2019-05-29 DIAGNOSIS — Z91128 Patient's intentional underdosing of medication regimen for other reason: Secondary | ICD-10-CM

## 2019-05-29 DIAGNOSIS — K625 Hemorrhage of anus and rectum: Secondary | ICD-10-CM

## 2019-05-29 DIAGNOSIS — R519 Headache, unspecified: Secondary | ICD-10-CM | POA: Diagnosis not present

## 2019-05-29 DIAGNOSIS — Y92009 Unspecified place in unspecified non-institutional (private) residence as the place of occurrence of the external cause: Secondary | ICD-10-CM

## 2019-05-29 DIAGNOSIS — D649 Anemia, unspecified: Secondary | ICD-10-CM

## 2019-05-29 DIAGNOSIS — F17210 Nicotine dependence, cigarettes, uncomplicated: Secondary | ICD-10-CM | POA: Diagnosis present

## 2019-05-29 DIAGNOSIS — K7030 Alcoholic cirrhosis of liver without ascites: Secondary | ICD-10-CM | POA: Diagnosis present

## 2019-05-29 DIAGNOSIS — Z79899 Other long term (current) drug therapy: Secondary | ICD-10-CM

## 2019-05-29 DIAGNOSIS — T500X6A Underdosing of mineralocorticoids and their antagonists, initial encounter: Secondary | ICD-10-CM | POA: Diagnosis present

## 2019-05-29 DIAGNOSIS — Z8711 Personal history of peptic ulcer disease: Secondary | ICD-10-CM

## 2019-05-29 DIAGNOSIS — K922 Gastrointestinal hemorrhage, unspecified: Secondary | ICD-10-CM | POA: Diagnosis present

## 2019-05-29 DIAGNOSIS — K429 Umbilical hernia without obstruction or gangrene: Secondary | ICD-10-CM | POA: Diagnosis present

## 2019-05-29 DIAGNOSIS — I8510 Secondary esophageal varices without bleeding: Secondary | ICD-10-CM | POA: Diagnosis present

## 2019-05-29 DIAGNOSIS — T473X6A Underdosing of saline and osmotic laxatives, initial encounter: Secondary | ICD-10-CM | POA: Diagnosis present

## 2019-05-29 DIAGNOSIS — Z823 Family history of stroke: Secondary | ICD-10-CM

## 2019-05-29 DIAGNOSIS — K766 Portal hypertension: Secondary | ICD-10-CM | POA: Diagnosis present

## 2019-05-29 DIAGNOSIS — T501X6A Underdosing of loop [high-ceiling] diuretics, initial encounter: Secondary | ICD-10-CM | POA: Diagnosis present

## 2019-05-29 DIAGNOSIS — D62 Acute posthemorrhagic anemia: Secondary | ICD-10-CM | POA: Diagnosis present

## 2019-05-29 DIAGNOSIS — K31811 Angiodysplasia of stomach and duodenum with bleeding: Principal | ICD-10-CM | POA: Diagnosis present

## 2019-05-29 DIAGNOSIS — Z20822 Contact with and (suspected) exposure to covid-19: Secondary | ICD-10-CM | POA: Diagnosis present

## 2019-05-29 DIAGNOSIS — D509 Iron deficiency anemia, unspecified: Secondary | ICD-10-CM | POA: Diagnosis present

## 2019-05-29 DIAGNOSIS — K3189 Other diseases of stomach and duodenum: Secondary | ICD-10-CM | POA: Diagnosis present

## 2019-05-29 LAB — PROTIME-INR
INR: 1.4 — ABNORMAL HIGH (ref 0.8–1.2)
Prothrombin Time: 16.6 seconds — ABNORMAL HIGH (ref 11.4–15.2)

## 2019-05-29 LAB — CBC WITH DIFFERENTIAL/PLATELET
Abs Immature Granulocytes: 0.28 10*3/uL — ABNORMAL HIGH (ref 0.00–0.07)
Abs Immature Granulocytes: 0.02 10*3/uL (ref 0.00–0.07)
Basophils Absolute: 0 10*3/uL (ref 0.0–0.1)
Basophils Absolute: 0 10*3/uL (ref 0.0–0.1)
Basophils Relative: 0 %
Basophils Relative: 0 %
Eosinophils Absolute: 0.3 10*3/uL (ref 0.0–0.5)
Eosinophils Absolute: 0.2 10*3/uL (ref 0.0–0.5)
Eosinophils Relative: 4 %
Eosinophils Relative: 4 %
HCT: 10.6 % — ABNORMAL LOW (ref 39.0–52.0)
HCT: 10 % — ABNORMAL LOW (ref 39.0–52.0)
Hemoglobin: 2.9 g/dL — CL (ref 13.0–17.0)
Hemoglobin: 2.6 g/dL — CL (ref 13.0–17.0)
Immature Granulocytes: 4 %
Immature Granulocytes: 0 %
Lymphocytes Relative: 14 %
Lymphocytes Relative: 21 %
Lymphs Abs: 1 10*3/uL (ref 0.7–4.0)
Lymphs Abs: 1.1 10*3/uL (ref 0.7–4.0)
MCH: 22 pg — ABNORMAL LOW (ref 26.0–34.0)
MCH: 21.7 pg — ABNORMAL LOW (ref 26.0–34.0)
MCHC: 27.4 g/dL — ABNORMAL LOW (ref 30.0–36.0)
MCHC: 26 g/dL — ABNORMAL LOW (ref 30.0–36.0)
MCV: 80.3 fL (ref 80.0–100.0)
MCV: 83.3 fL (ref 80.0–100.0)
Monocytes Absolute: 1.1 10*3/uL — ABNORMAL HIGH (ref 0.1–1.0)
Monocytes Absolute: 0.7 10*3/uL (ref 0.1–1.0)
Monocytes Relative: 16 %
Monocytes Relative: 13 %
Neutro Abs: 4.1 10*3/uL (ref 1.7–7.7)
Neutro Abs: 3.3 10*3/uL (ref 1.7–7.7)
Neutrophils Relative %: 62 %
Neutrophils Relative %: 62 %
Platelets: 200 10*3/uL (ref 150–400)
Platelets: 184 10*3/uL (ref 150–400)
RBC: 1.32 MIL/uL — ABNORMAL LOW (ref 4.22–5.81)
RBC: 1.2 MIL/uL — ABNORMAL LOW (ref 4.22–5.81)
RDW: 21.2 % — ABNORMAL HIGH (ref 11.5–15.5)
RDW: 21.2 % — ABNORMAL HIGH (ref 11.5–15.5)
WBC: 6.6 10*3/uL (ref 4.0–10.5)
WBC: 5.4 10*3/uL (ref 4.0–10.5)
nRBC: 0.3 % — ABNORMAL HIGH (ref 0.0–0.2)
nRBC: 0.4 % — ABNORMAL HIGH (ref 0.0–0.2)

## 2019-05-29 LAB — COMPREHENSIVE METABOLIC PANEL
ALT: 19 U/L (ref 0–44)
AST: 30 U/L (ref 15–41)
Albumin: 2.7 g/dL — ABNORMAL LOW (ref 3.5–5.0)
Alkaline Phosphatase: 72 U/L (ref 38–126)
Anion gap: 11 (ref 5–15)
BUN: 21 mg/dL (ref 8–23)
CO2: 16 mmol/L — ABNORMAL LOW (ref 22–32)
Calcium: 8.3 mg/dL — ABNORMAL LOW (ref 8.9–10.3)
Chloride: 106 mmol/L (ref 98–111)
Creatinine, Ser: 1.07 mg/dL (ref 0.61–1.24)
GFR calc Af Amer: >60 mL/min (ref >60)
GFR calc non Af Amer: >60 mL/min (ref >60)
Glucose, Bld: 136 mg/dL — ABNORMAL HIGH (ref 70–99)
Potassium: 4.2 mmol/L (ref 3.5–5.1)
Sodium: 133 mmol/L — ABNORMAL LOW (ref 135–145)
Total Bilirubin: 0.9 mg/dL (ref 0.3–1.2)
Total Protein: 6 g/dL — ABNORMAL LOW (ref 6.5–8.1)

## 2019-05-29 LAB — SARS CORONAVIRUS 2 BY RT PCR (HOSPITAL ORDER, PERFORMED IN ~~LOC~~ HOSPITAL LAB): SARS Coronavirus 2: NEGATIVE

## 2019-05-29 LAB — PREPARE RBC (CROSSMATCH)

## 2019-05-29 LAB — POC OCCULT BLOOD, ED: Fecal Occult Bld: POSITIVE — AB

## 2019-05-29 MED ORDER — SODIUM CHLORIDE 0.9 % IV SOLN: INTRAVENOUS | Status: DC

## 2019-05-29 NOTE — ED Notes (Signed)
Pt signed paper blood consents.

## 2019-05-29 NOTE — ED Notes (Signed)
Dr Eulis Foster aware Hgb 2.1

## 2019-05-29 NOTE — ED Provider Notes (Addendum)
[name] Provider Note   CSN: [id number] Arrival date & time:        History Chief Complaint  Patient presents with  . Weakness    Gavin Arroyo is a 64 y.o. male.  HPI He presents for evaluation of weakness which he states has worsened since he was discharged home from [facility], 3 weeks ago.  At that time he had been admitted and treated for edema, and was diuresed.  He also complains of pain in his lungs and trouble breathing.  He denies swelling in his legs currently.  Patient had been admitted for GI bleeding and had a comprehensive evaluation.  Patient was newly diagnosed with cirrhosis, suspect to be from alcoholic liver disease during the admission.  He was not on medicines at the time of admission but was discharged on furosemide, thiamine, spironolactone and a PPI.  Today patient called EMS because he was too weak to walk.    Past Medical History:  Diagnosis Date  . GI bleeding 03/2019    Patient Active Problem List   Diagnosis Date Noted  . Hepatic encephalopathy ([place]) 03/30/2019  . Malnutrition of moderate degree 03/28/2019  . Hepatic cirrhosis ([place]) 03/24/2019  . Acute GI bleeding 03/20/2019  . Acute blood loss anemia 03/19/2019  . Symptomatic anemia   . NAGMA 03/14/2019  . Alcohol use disorder, moderate, dependence ([place]) 03/14/2019    Past Surgical History:  Procedure Laterality Date  . COLONOSCOPY WITH PROPOFOL N/A 03/16/2019   Procedure: COLONOSCOPY WITH PROPOFOL;  Surgeon: Ronnette Juniper, MD;  Location: [place];  Service: Gastroenterology;  Laterality: N/A;  . COLONOSCOPY WITH PROPOFOL N/A 03/20/2019   Procedure: COLONOSCOPY WITH PROPOFOL;  Surgeon: Ronnette Juniper, MD;  Location: [place];  Service: Gastroenterology;  Laterality: N/A;  . COLONOSCOPY WITH PROPOFOL N/A 04/07/2019   Procedure: COLONOSCOPY WITH PROPOFOL;  Surgeon: Clarene Essex, MD;  Location: [place];  Service: Endoscopy;  Laterality: N/A;  .  ESOPHAGOGASTRODUODENOSCOPY (EGD) WITH PROPOFOL N/A 03/16/2019   Procedure: ESOPHAGOGASTRODUODENOSCOPY (EGD) WITH PROPOFOL;  Surgeon: Ronnette Juniper, MD;  Location: [place];  Service: Gastroenterology;  Laterality: N/A;  . ESOPHAGOGASTRODUODENOSCOPY (EGD) WITH PROPOFOL N/A 04/02/2019   Procedure: ESOPHAGOGASTRODUODENOSCOPY (EGD) WITH PROPOFOL;  Surgeon: Wilford Corner, MD;  Location: [place];  Service: Endoscopy;  Laterality: N/A;  . ESOPHAGOGASTRODUODENOSCOPY (EGD) WITH PROPOFOL N/A 04/12/2019   Procedure: ESOPHAGOGASTRODUODENOSCOPY (EGD) WITH PROPOFOL;  Surgeon: Clarene Essex, MD;  Location: [place];  Service: Endoscopy;  Laterality: N/A;  And possible capsule endoscopy as well  . GIVENS CAPSULE STUDY N/A 04/12/2019   Procedure: GIVENS CAPSULE STUDY;  Surgeon: Clarene Essex, MD;  Location: [place];  Service: Endoscopy;  Laterality: N/A;  . HEMOSTASIS CLIP PLACEMENT  03/20/2019   Procedure: HEMOSTASIS CLIP PLACEMENT;  Surgeon: Ronnette Juniper, MD;  Location: [facility] ENDOSCOPY;  Service: Gastroenterology;;  . HEMOSTASIS CONTROL  03/20/2019   Procedure: HEMOSTASIS CONTROL;  Surgeon: Ronnette Juniper, MD;  Location: [facility] ENDOSCOPY;  Service: Gastroenterology;;  . POLYPECTOMY  03/16/2019   Procedure: POLYPECTOMY;  Surgeon: Ronnette Juniper, MD;  Location: [facility] ENDOSCOPY;  Service: Gastroenterology;;  . POLYPECTOMY  03/20/2019   Procedure: POLYPECTOMY;  Surgeon: Ronnette Juniper, MD;  Location: [facility] ENDOSCOPY;  Service: Gastroenterology;;  . REPAIR OF PERFORATED ULCER  2010  ?       History reviewed. No pertinent family history.  Social History   Tobacco Use  . Smoking status: Current Some Day Smoker    Packs/day: 0.25    Types: Cigarettes  .  Smokeless tobacco: Never Used  Substance Use Topics  . Alcohol use: Not Currently    Alcohol/week: 1.0 standard drinks    Types: 1 Shots of liquor per week    Comment: liquor every day , one gallon a day   . Drug use: Yes    Types: Cocaine    Comment: every day    Home  Medications Prior to Admission medications   Medication Sig Start Date End Date Taking? Authorizing Provider  acetaminophen (TYLENOL) 325 MG tablet Take 2 tablets (650 mg total) by mouth every 6 (six) hours as needed for mild pain (or Fever >/= 101). 04/17/19   Earlene Plater, MD  acetaminophen (TYLENOL) 650 MG suppository Place 1 suppository (650 mg total) rectally every 6 (six) hours as needed for mild pain (or Fever >/= 101). 04/17/19   Earlene Plater, MD  Amino Acids-Protein Hydrolys (FEEDING SUPPLEMENT, PRO-STAT SUGAR FREE 64,) LIQD Take 30 mLs by mouth 3 (three) times daily. 04/17/19   Earlene Plater, MD  chlorhexidine (PERIDEX) 0.12 % solution 15 mLs by Mouth Rinse route 2 (two) times daily. 04/17/19   Earlene Plater, MD  feeding supplement, ENSURE ENLIVE, (ENSURE ENLIVE) LIQD Take 237 mLs by mouth 2 (two) times daily between meals. 04/18/19   Earlene Plater, MD  folic acid (FOLVITE) 1 MG tablet Take 1 tablet (1 mg total) by mouth daily. 04/18/19   Earlene Plater, MD  furosemide (LASIX) 40 MG tablet Take 1 tablet (40 mg total) by mouth daily. 03/29/19   Jean Rosenthal, MD  lactulose (CHRONULAC) 10 GM/15ML solution Take 45 mLs (30 g total) by mouth 3 (three) times daily. 04/17/19   Earlene Plater, MD  midodrine (PROAMATINE) 10 MG tablet Take 1 tablet (10 mg total) by mouth 3 (three) times daily with meals. 04/17/19   Earlene Plater, MD  mouth rinse LIQD solution 15 mLs by Mouth Rinse route 2 times daily at 12 noon and 4 pm. 04/17/19   Earlene Plater, MD  Multiple Vitamin (MULTIVITAMIN WITH MINERALS) TABS tablet Take 1 tablet by mouth daily. 04/18/19   Earlene Plater, MD  pantoprazole (PROTONIX) 40 MG tablet Take 1 tablet (40 mg total) by mouth 2 (two) times daily. 04/17/19   Earlene Plater, MD  polyvinyl alcohol (LIQUIFILM TEARS) 1.4 % ophthalmic solution Place 1 drop into both eyes as needed for dry eyes. 04/17/19   Earlene Plater, MD  promethazine (PHENERGAN) 12.5 MG tablet Take  1 tablet (12.5 mg total) by mouth every 6 (six) hours as needed for nausea. 04/17/19   Earlene Plater, MD  senna-docusate (SENOKOT-S) 8.6-50 MG tablet Take 1 tablet by mouth at bedtime as needed for mild constipation. 04/17/19   Earlene Plater, MD  sodium chloride (OCEAN) 0.65 % SOLN nasal spray Place 1 spray into both nostrils as needed for congestion (nose irritation). 04/17/19   Earlene Plater, MD  spironolactone (ALDACTONE) 100 MG tablet Take 1 tablet (100 mg total) by mouth daily. 03/29/19   Jean Rosenthal, MD  sucralfate (CARAFATE) 1 GM/10ML suspension Take 10 mLs (1 g total) by mouth 4 (four) times daily -  with meals and at bedtime. 04/17/19   Earlene Plater, MD  thiamine 100 MG tablet Take 1 tablet (100 mg total) by mouth daily. 04/18/19   Earlene Plater, MD    Allergies    Patient has no known allergies.  Review of Systems   Review of Systems  All other systems reviewed and are negative.   Physical Exam Updated Vital Signs BP 114/60  Pulse 87   Temp 98.8 F (37.1 C) (Oral)   Resp 14   SpO2 100%   Physical Exam Vitals and nursing note reviewed.  Constitutional:      General: He is not in acute distress.    Appearance: He is well-developed. He is ill-appearing. He is not toxic-appearing or diaphoretic.     Comments: Disheveled. Appears older than stated age.  HENT:     Head: Normocephalic and atraumatic.     Right Ear: External ear normal.     Left Ear: External ear normal.  Eyes:     General: No scleral icterus.    Conjunctiva/sclera: Conjunctivae normal.     Pupils: Pupils are equal, round, and reactive to light.  Neck:     Trachea: Phonation normal.  Cardiovascular:     Rate and Rhythm: Normal rate and regular rhythm.     Heart sounds: Normal heart sounds.  Pulmonary:     Effort: Pulmonary effort is normal.     Breath sounds: Normal breath sounds.  Abdominal:     General: There is distension.     Palpations: Abdomen is soft.     Tenderness: There is  no abdominal tenderness.  Musculoskeletal:        General: No swelling. Normal range of motion.     Cervical back: Normal range of motion and neck supple.     Right lower leg: No edema.     Left lower leg: No edema.  Skin:    General: Skin is warm and dry.     Coloration: Skin is pale.  Neurological:     Mental Status: He is alert and oriented to person, place, and time.     Cranial Nerves: No cranial nerve deficit.     Sensory: No sensory deficit.     Motor: No abnormal muscle tone.     Coordination: Coordination normal.  Psychiatric:        Mood and Affect: Mood normal.        Behavior: Behavior normal.        Thought Content: Thought content normal.        Judgment: Judgment normal.     ED Results / Procedures / Treatments   Labs (all labs ordered are listed, but only abnormal results are displayed) Labs Reviewed  COMPREHENSIVE METABOLIC PANEL - Abnormal; Notable for the following components:      Result Value   Sodium 133 (*)    CO2 16 (*)    Glucose, Bld 136 (*)    Calcium 8.3 (*)    Total Protein 6.0 (*)    Albumin 2.7 (*)    All other components within normal limits  CBC WITH DIFFERENTIAL/PLATELET - Abnormal; Notable for the following components:   RBC 1.32 (*)    Hemoglobin 2.9 (*)    HCT 10.6 (*)    MCH 22.0 (*)    MCHC 27.4 (*)    RDW 21.2 (*)    nRBC 0.3 (*)    Monocytes Absolute 1.1 (*)    Abs Immature Granulocytes 0.28 (*)    All other components within normal limits  CBC WITH DIFFERENTIAL/PLATELET - Abnormal; Notable for the following components:   RBC 1.20 (*)    Hemoglobin 2.6 (*)    HCT 10.0 (*)    MCH 21.7 (*)    MCHC 26.0 (*)    RDW 21.2 (*)    nRBC 0.4 (*)    All other components within normal limits  PROTIME-INR -  Abnormal; Notable for the following components:   Prothrombin Time 16.6 (*)    INR 1.4 (*)    All other components within normal limits  POC OCCULT BLOOD, ED - Abnormal; Notable for the following components:   Fecal Occult  Bld POSITIVE (*)    All other components within normal limits  SARS CORONAVIRUS 2 BY RT PCR (HOSPITAL ORDER, [place] [facility])  TYPE AND SCREEN  PREPARE RBC (CROSSMATCH)    EKG EKG Interpretation  Date/Time:  Friday May 29 2019 17:20:32 EDT Ventricular Rate:  85 PR Interval:    QRS Duration: 108 QT Interval:  410 QTC Calculation: 488 R Axis:   77 Text Interpretation: Sinus rhythm Minimal ST depression, lateral leads Borderline prolonged QT interval Since last tracing rate slower and QT has shortened Otherwise no significant change Confirmed by Daleen Bo [phone]) on 05/29/2019 5:26:18 PM   Radiology DG Chest 2 View  Result Date: 05/29/2019 CLINICAL DATA:  Progressive weakness over 1 month, short of breath, anemia, cirrhosis EXAM: CHEST - 2 VIEW COMPARISON:  03/29/2019 FINDINGS: Frontal and lateral views of the chest demonstrate a stable enlarged cardiac silhouette. Mild central vascular congestion. No airspace disease, effusion, or pneumothorax. No acute bony abnormalities. Prior bilateral healed rib fractures. IMPRESSION: 1. Central vascular congestion without overt edema. Electronically Signed   By: Randa Ngo M.D.   On: 05/29/2019 18:56    Procedures .Critical Care Performed by: Daleen Bo, MD Authorized by: Daleen Bo, MD   Critical care provider statement:    Critical care time (minutes):  35   Critical care start time:  05/29/2019 5:15 PM   Critical care time was exclusive of:  Separately billable procedures and treating other patients   Critical care was necessary to treat or prevent imminent or life-threatening deterioration of the following conditions:  Circulatory failure   Critical care was time spent personally by me on the following activities:  Blood draw for specimens, development of treatment plan with patient or surrogate, discussions with consultants, evaluation of patient's response to treatment, examination of patient,  obtaining history from patient or surrogate, ordering and performing treatments and interventions, ordering and review of laboratory studies, pulse oximetry, re-evaluation of patient's condition, review of old charts and ordering and review of radiographic studies   (including critical care time)  Medications Ordered in ED Medications  0.9 %  sodium chloride infusion ( Intravenous New Bag/Given 05/29/19 1829)    ED Course  I have reviewed the triage vital signs and the nursing notes.  Pertinent labs & imaging results that were available during my care of the patient were reviewed by me and considered in my medical decision making (see chart for details).  Clinical Course as of May 29 2207  Fri May 29, 2019  2040 Normal except hemoglobin low, hematocrit low, RBC indices abnormal  CBC with Differential(!!) [EW]  2040 Abnormal, presence of blood  POC occult blood, ED RN will collect(!) [EW]  2041 Normal except sodium low, CO2 low, glucose high, calcium low, total protein low, albumin low  Comprehensive metabolic panel(!) [EW]  0000000 Mildly elevated  Protime-INR(!) [EW]  2205 Transfusion Status: OK TO TRANSFUSE [EW]  2207 Cardiomegaly with pulmonary vascular congestion, no edema or infiltrate   [EW]  2208 Low sodium, low CO2, low calcium, low protein  Transfusion Status: OK TO TRANSFUSE [EW]    Clinical Course User Index [EW] Daleen Bo, MD   MDM Rules/Calculators/A&P  Patient Vitals for the past 24 hrs:  BP Temp Temp src Pulse Resp SpO2  05/29/19 2122 114/60 98.8 F (37.1 C) Oral 87 14 100 %  05/29/19 2103 (!) 108/52 98.1 F (36.7 C) Oral 95 20 100 %  05/29/19 2030 (!) 90/38 - - 84 14 100 %  05/29/19 2015 (!) 96/37 - - 83 12 100 %  05/29/19 2000 (!) 108/46 - - 82 12 100 %  05/29/19 1945 (!) 102/50 - - 82 14 100 %  05/29/19 1930 (!) 100/49 - - 86 20 98 %  05/29/19 1915 (!) 99/39 - - 87 18 100 %  05/29/19 1900 (!) 104/54 - - 83 18 100 %  05/29/19  1723 (!) 112/54 97.9 F (36.6 C) Oral 86 16 93 %    10:05 PM Reevaluation with update and discussion. After initial assessment and treatment, an updated evaluation reveals he remains stable, first unit of blood infusing, will consult GI for assistance, and anticipate hospitalist admission. Daleen Bo   Medical Decision Making:  This patient is presenting for evaluation of weakness and shortness of breath., which does require a range of treatment options, and is a complaint that involves a high risk of morbidity and mortality. The differential diagnoses include pneumonia, URI, recurrent GI bleeding. I decided to review old records, and in summary patient with newly diagnosed alcoholic cirrhosis and recent marked GI bleeding, both upper and lower, presenting now with recurrent weakness.  I did not require additional historical information from anyone.  Clinical Laboratory Tests Ordered, included CBC, Metabolic panel and INR, type and screen. Review indicates very low hemoglobin 2.6, low hematocrit,. Radiologic Tests Ordered, included chest x-ray.  I independently Visualized: Radiographic images, which show pulmonary vascular congestion without edema or infiltrate  Cardiac Monitor Tracing which shows normal sinus rhythm   Specimen of stool indicating brown color. I discussed the gastroenterologist on-call for his practice results with Dr. Darrel Hoover, he will see patient tomorrow as a Optometrist.   Critical Interventions-clinical evaluation, laboratory testing, IV fluids, resuscitative blood transfusion, observation, consultation with gastroenterology, arranges for admission to hospitalist service  After These Interventions, the Patient was reevaluated and was found with severe anemia, and soft blood pressure.  Patient has a history of massive GI bleeding requiring.  Than 30 units of transfusion in March 2021.  Subsequent to that he had comprehensive evaluations, with multiple endoscopies.  He was  also referred to [facility] for double-balloon enteroscopy.  At that time it was found that he had both upper and lower GI bleeding sources, without clear evidence for source for massive blood loss.  Most recent intervention was clipping of bleeding sites in the colon.   CRITICAL CARE-yes Performed by: Daleen Bo  Nursing Notes Reviewed/ Care Coordinated Applicable Imaging Reviewed Interpretation of Laboratory Data incorporated into ED treatment   Plan-Case discussed with intensivist, Dr. Oletta Darter, who will have his team evaluate the patient for likely admission to the ICU.    Final Clinical Impression(s) / ED Diagnoses Final diagnoses:  Rectal bleeding  Anemia, unspecified type    Rx / DC Orders ED Discharge Orders    None       Daleen Bo, MD 05/29/19 2318    Daleen Bo, MD 06/07/19 [phone]

## 2019-05-29 NOTE — ED Notes (Signed)
Pt stated he would rather not stand up during orthostatic vital signs.

## 2019-05-29 NOTE — ED Triage Notes (Signed)
PT states he has been getting progressively weaker over the past month.  He has been seen at [facility] for anemia and cirrhosis and the last few days he has been unable to walk.  Pt was unable to ambulate for ems.    cbg 178 Hr 89 bp 104/59 spo2 93 rr 18

## 2019-05-30 DIAGNOSIS — Z8711 Personal history of peptic ulcer disease: Secondary | ICD-10-CM | POA: Diagnosis not present

## 2019-05-30 DIAGNOSIS — K7030 Alcoholic cirrhosis of liver without ascites: Secondary | ICD-10-CM | POA: Diagnosis present

## 2019-05-30 DIAGNOSIS — T501X6A Underdosing of loop [high-ceiling] diuretics, initial encounter: Secondary | ICD-10-CM | POA: Diagnosis present

## 2019-05-30 DIAGNOSIS — F17210 Nicotine dependence, cigarettes, uncomplicated: Secondary | ICD-10-CM | POA: Diagnosis present

## 2019-05-30 DIAGNOSIS — Z79899 Other long term (current) drug therapy: Secondary | ICD-10-CM | POA: Diagnosis not present

## 2019-05-30 DIAGNOSIS — K922 Gastrointestinal hemorrhage, unspecified: Secondary | ICD-10-CM | POA: Diagnosis present

## 2019-05-30 DIAGNOSIS — D509 Iron deficiency anemia, unspecified: Secondary | ICD-10-CM | POA: Diagnosis present

## 2019-05-30 DIAGNOSIS — K625 Hemorrhage of anus and rectum: Secondary | ICD-10-CM | POA: Diagnosis present

## 2019-05-30 DIAGNOSIS — K766 Portal hypertension: Secondary | ICD-10-CM | POA: Diagnosis present

## 2019-05-30 DIAGNOSIS — T500X6A Underdosing of mineralocorticoids and their antagonists, initial encounter: Secondary | ICD-10-CM | POA: Diagnosis present

## 2019-05-30 DIAGNOSIS — T473X6A Underdosing of saline and osmotic laxatives, initial encounter: Secondary | ICD-10-CM | POA: Diagnosis present

## 2019-05-30 DIAGNOSIS — D62 Acute posthemorrhagic anemia: Secondary | ICD-10-CM | POA: Diagnosis present

## 2019-05-30 DIAGNOSIS — Z20822 Contact with and (suspected) exposure to covid-19: Secondary | ICD-10-CM | POA: Diagnosis present

## 2019-05-30 DIAGNOSIS — Z823 Family history of stroke: Secondary | ICD-10-CM | POA: Diagnosis not present

## 2019-05-30 DIAGNOSIS — I8510 Secondary esophageal varices without bleeding: Secondary | ICD-10-CM | POA: Diagnosis present

## 2019-05-30 DIAGNOSIS — Y92009 Unspecified place in unspecified non-institutional (private) residence as the place of occurrence of the external cause: Secondary | ICD-10-CM | POA: Diagnosis not present

## 2019-05-30 DIAGNOSIS — R519 Headache, unspecified: Secondary | ICD-10-CM | POA: Diagnosis not present

## 2019-05-30 DIAGNOSIS — K31811 Angiodysplasia of stomach and duodenum with bleeding: Secondary | ICD-10-CM | POA: Diagnosis present

## 2019-05-30 DIAGNOSIS — K3189 Other diseases of stomach and duodenum: Secondary | ICD-10-CM | POA: Diagnosis present

## 2019-05-30 DIAGNOSIS — K429 Umbilical hernia without obstruction or gangrene: Secondary | ICD-10-CM | POA: Diagnosis present

## 2019-05-30 DIAGNOSIS — Z91128 Patient's intentional underdosing of medication regimen for other reason: Secondary | ICD-10-CM | POA: Diagnosis not present

## 2019-05-30 LAB — GLUCOSE, CAPILLARY
Glucose-Capillary: 117 mg/dL — ABNORMAL HIGH (ref 70–99)
Glucose-Capillary: 105 mg/dL — ABNORMAL HIGH (ref 70–99)
Glucose-Capillary: 86 mg/dL (ref 70–99)
Glucose-Capillary: 99 mg/dL (ref 70–99)
Glucose-Capillary: 133 mg/dL — ABNORMAL HIGH (ref 70–99)
Glucose-Capillary: 153 mg/dL — ABNORMAL HIGH (ref 70–99)

## 2019-05-30 LAB — HEMOGLOBIN A1C
Hgb A1c MFr Bld: 5.3 % (ref 4.8–5.6)
Mean Plasma Glucose: 105.41 mg/dL

## 2019-05-30 LAB — TECHNOLOGIST SMEAR REVIEW

## 2019-05-30 LAB — IRON AND TIBC
Iron: 22 ug/dL — ABNORMAL LOW (ref 45–182)
Saturation Ratios: 5 % — ABNORMAL LOW (ref 17.9–39.5)
TIBC: 431 ug/dL (ref 250–450)
UIBC: 409 ug/dL

## 2019-05-30 LAB — CBC
HCT: 21.4 % — ABNORMAL LOW (ref 39.0–52.0)
HCT: 22.4 % — ABNORMAL LOW (ref 39.0–52.0)
Hemoglobin: 6.8 g/dL — CL (ref 13.0–17.0)
Hemoglobin: 7.1 g/dL — ABNORMAL LOW (ref 13.0–17.0)
MCH: 26 pg (ref 26.0–34.0)
MCH: 26.2 pg (ref 26.0–34.0)
MCHC: 31.8 g/dL (ref 30.0–36.0)
MCHC: 31.7 g/dL (ref 30.0–36.0)
MCV: 81.7 fL (ref 80.0–100.0)
MCV: 82.7 fL (ref 80.0–100.0)
Platelets: 169 10*3/uL (ref 150–400)
Platelets: 179 10*3/uL (ref 150–400)
RBC: 2.62 MIL/uL — ABNORMAL LOW (ref 4.22–5.81)
RBC: 2.71 MIL/uL — ABNORMAL LOW (ref 4.22–5.81)
RDW: 17 % — ABNORMAL HIGH (ref 11.5–15.5)
RDW: 17.3 % — ABNORMAL HIGH (ref 11.5–15.5)
WBC: 5.1 10*3/uL (ref 4.0–10.5)
WBC: 6.3 10*3/uL (ref 4.0–10.5)
nRBC: 0 % (ref 0.0–0.2)
nRBC: 0.3 % — ABNORMAL HIGH (ref 0.0–0.2)

## 2019-05-30 LAB — BASIC METABOLIC PANEL
Anion gap: 11 (ref 5–15)
BUN: 17 mg/dL (ref 8–23)
CO2: 18 mmol/L — ABNORMAL LOW (ref 22–32)
Calcium: 8.3 mg/dL — ABNORMAL LOW (ref 8.9–10.3)
Chloride: 108 mmol/L (ref 98–111)
Creatinine, Ser: 0.96 mg/dL (ref 0.61–1.24)
GFR calc Af Amer: >60 mL/min (ref >60)
GFR calc non Af Amer: >60 mL/min (ref >60)
Glucose, Bld: 103 mg/dL — ABNORMAL HIGH (ref 70–99)
Potassium: 4.1 mmol/L (ref 3.5–5.1)
Sodium: 137 mmol/L (ref 135–145)

## 2019-05-30 LAB — FERRITIN: Ferritin: 16 ng/mL — ABNORMAL LOW (ref 24–336)

## 2019-05-30 LAB — VITAMIN B12: Vitamin B-12: 561 pg/mL (ref 180–914)

## 2019-05-30 LAB — MRSA PCR SCREENING: MRSA by PCR: NEGATIVE

## 2019-05-30 LAB — FOLATE: Folate: 29.2 ng/mL (ref >5.9)

## 2019-05-30 LAB — MAGNESIUM: Magnesium: 2 mg/dL (ref 1.7–2.4)

## 2019-05-30 LAB — PREPARE RBC (CROSSMATCH)

## 2019-05-30 LAB — PHOSPHORUS: Phosphorus: 4.8 mg/dL — ABNORMAL HIGH (ref 2.5–4.6)

## 2019-05-30 MED ORDER — SODIUM CHLORIDE 0.9 % IV SOLN
25.0000 ug/h | INTRAVENOUS | Status: DC
  Administered 2019-05-30: 25 ug/h via INTRAVENOUS
  Filled 2019-05-30: qty 1

## 2019-05-30 MED ORDER — LACTULOSE 10 GM/15ML PO SOLN
30.0000 g | Freq: Three times a day (TID) | ORAL | Status: DC
  Administered 2019-05-30 – 2019-06-02 (×8): 30 g via ORAL
  Filled 2019-05-30 (×9): qty 45

## 2019-05-30 MED ORDER — SODIUM CHLORIDE 0.9 % IV SOLN
2.0000 g | Freq: Every day | INTRAVENOUS | Status: DC
  Administered 2019-05-30: 2 g via INTRAVENOUS
  Filled 2019-05-30: qty 20

## 2019-05-30 MED ORDER — INSULIN ASPART 100 UNIT/ML ~~LOC~~ SOLN
0.0000 [IU] | SUBCUTANEOUS | Status: DC
  Administered 2019-05-30 (×2): 1 [IU] via SUBCUTANEOUS

## 2019-05-30 MED ORDER — DOCUSATE SODIUM 100 MG PO CAPS: 100.0000 mg | ORAL_CAPSULE | Freq: Two times a day (BID) | ORAL | Status: DC | PRN

## 2019-05-30 MED ORDER — FUROSEMIDE 40 MG PO TABS
40.0000 mg | ORAL_TABLET | Freq: Every day | ORAL | Status: DC
  Administered 2019-05-31 – 2019-06-02 (×3): 40 mg via ORAL
  Filled 2019-05-30 (×4): qty 1

## 2019-05-30 MED ORDER — PANTOPRAZOLE SODIUM 40 MG IV SOLR
40.0000 mg | Freq: Two times a day (BID) | INTRAVENOUS | Status: DC
  Administered 2019-05-30 – 2019-06-01 (×6): 40 mg via INTRAVENOUS
  Filled 2019-05-30 (×6): qty 40

## 2019-05-30 MED ORDER — SPIRONOLACTONE 25 MG PO TABS
100.0000 mg | ORAL_TABLET | Freq: Every day | ORAL | Status: DC
  Administered 2019-05-31 – 2019-06-02 (×3): 100 mg via ORAL
  Filled 2019-05-30: qty 1
  Filled 2019-05-30: qty 4
  Filled 2019-05-30: qty 1
  Filled 2019-05-30: qty 4

## 2019-05-30 MED ORDER — CHLORHEXIDINE GLUCONATE CLOTH 2 % EX PADS
6.0000 | MEDICATED_PAD | Freq: Every day | CUTANEOUS | Status: DC
  Administered 2019-05-30: 6 via TOPICAL

## 2019-05-30 MED ORDER — SODIUM CHLORIDE 0.9% IV SOLUTION: Freq: Once | INTRAVENOUS | Status: DC

## 2019-05-30 MED ORDER — POLYETHYLENE GLYCOL 3350 17 G PO PACK: 17.0000 g | PACK | Freq: Every day | ORAL | Status: DC | PRN

## 2019-05-30 NOTE — Progress Notes (Signed)
eLink Physician-Brief Progress Note Patient Name: Gavin Arroyo DOB: November 18, 1955 MRN: [id number]   Date of Service  05/30/2019  HPI/Events of Note  H&P notes plan to start Octreotide IV infusion . No orders for Octreotide IV infusion.   eICU Interventions  Plan: 1. Octreotide IV infusion at 25 mcg/hour.      Intervention Category Major Interventions: Other:  Ardell Aaronson Cornelia Copa 05/30/2019, 3:05 AM

## 2019-05-30 NOTE — Consult Note (Signed)
Subjective:   HPI  The patient is a 64 year old male with a history of alcohol abuse and a remote history of perforated peptic ulcer and cirrhosis of the liver.  He was readmitted to [facility] last evening after he came to the emergency room and was found to have a hemoglobin of 2.6.  He has not noticed any signs of gastrointestinal bleeding specifically denies rectal bleeding, hematemesis, coffee-ground emesis, or melena since his last admission.  His prior history of multiple endoscopies, colonoscopies, capsule endoscopy, transfer to [facility] for double-balloon enteroscopy were reviewed.  The findings are documented on the H&P and reviewed on chart and procedure reviews.  He has received several units of packed red cells since last night, and he feels well.     Past Medical History:  Diagnosis Date  . GI bleeding 03/2019   Past Surgical History:  Procedure Laterality Date  . COLONOSCOPY WITH PROPOFOL N/A 03/16/2019   Procedure: COLONOSCOPY WITH PROPOFOL;  Surgeon: Ronnette Juniper, MD;  Location: [place];  Service: Gastroenterology;  Laterality: N/A;  . COLONOSCOPY WITH PROPOFOL N/A 03/20/2019   Procedure: COLONOSCOPY WITH PROPOFOL;  Surgeon: Ronnette Juniper, MD;  Location: [place];  Service: Gastroenterology;  Laterality: N/A;  . COLONOSCOPY WITH PROPOFOL N/A 04/07/2019   Procedure: COLONOSCOPY WITH PROPOFOL;  Surgeon: Clarene Essex, MD;  Location: [place];  Service: Endoscopy;  Laterality: N/A;  . ESOPHAGOGASTRODUODENOSCOPY (EGD) WITH PROPOFOL N/A 03/16/2019   Procedure: ESOPHAGOGASTRODUODENOSCOPY (EGD) WITH PROPOFOL;  Surgeon: Ronnette Juniper, MD;  Location: [place];  Service: Gastroenterology;  Laterality: N/A;  . ESOPHAGOGASTRODUODENOSCOPY (EGD) WITH PROPOFOL N/A 04/02/2019   Procedure: ESOPHAGOGASTRODUODENOSCOPY (EGD) WITH PROPOFOL;  Surgeon: Wilford Corner, MD;  Location: [place];  Service: Endoscopy;  Laterality: N/A;  . ESOPHAGOGASTRODUODENOSCOPY (EGD) WITH  PROPOFOL N/A 04/12/2019   Procedure: ESOPHAGOGASTRODUODENOSCOPY (EGD) WITH PROPOFOL;  Surgeon: Clarene Essex, MD;  Location: [place];  Service: Endoscopy;  Laterality: N/A;  And possible capsule endoscopy as well  . GIVENS CAPSULE STUDY N/A 04/12/2019   Procedure: GIVENS CAPSULE STUDY;  Surgeon: Clarene Essex, MD;  Location: [place];  Service: Endoscopy;  Laterality: N/A;  . HEMOSTASIS CLIP PLACEMENT  03/20/2019   Procedure: HEMOSTASIS CLIP PLACEMENT;  Surgeon: Ronnette Juniper, MD;  Location: [facility] ENDOSCOPY;  Service: Gastroenterology;;  . HEMOSTASIS CONTROL  03/20/2019   Procedure: HEMOSTASIS CONTROL;  Surgeon: Ronnette Juniper, MD;  Location: [facility] ENDOSCOPY;  Service: Gastroenterology;;  . POLYPECTOMY  03/16/2019   Procedure: POLYPECTOMY;  Surgeon: Ronnette Juniper, MD;  Location: [facility] ENDOSCOPY;  Service: Gastroenterology;;  . POLYPECTOMY  03/20/2019   Procedure: POLYPECTOMY;  Surgeon: Ronnette Juniper, MD;  Location: [facility] ENDOSCOPY;  Service: Gastroenterology;;  . REPAIR OF PERFORATED ULCER  2010  ?   Social History   Socioeconomic History  . Marital status: Single    Spouse name: Not on file  . Number of children: Not on file  . Years of education: Not on file  . Highest education level: Not on file  Occupational History  . Not on file  Tobacco Use  . Smoking status: Current Some Day Smoker    Packs/day: 0.25    Types: Cigarettes  . Smokeless tobacco: Never Used  Substance and Sexual Activity  . Alcohol use: Not Currently    Alcohol/week: 1.0 standard drinks    Types: 1 Shots of liquor per week    Comment: liquor every day , one gallon a day   . Drug use: Yes    Types: Cocaine    Comment: every  day  . Sexual activity: Not on file  Other Topics Concern  . Not on file  Social History Narrative  . Not on file   Social Determinants of Health   Financial Resource Strain:   . Difficulty of Paying Living Expenses:   Food Insecurity:   . Worried About Charity fundraiser in the Last Year:   . Youth worker in the Last Year:   Transportation Needs:   . Film/video editor (Medical):   Marland Kitchen Lack of Transportation (Non-Medical):   Physical Activity:   . Days of Exercise per Week:   . Minutes of Exercise per Session:   Stress:   . Feeling of Stress :   Social Connections:   . Frequency of Communication with Friends and Family:   . Frequency of Social Gatherings with Friends and Family:   . Attends Religious Services:   . Active Member of Clubs or Organizations:   . Attends Archivist Meetings:   Marland Kitchen Marital Status:   Intimate Partner Violence:   . Fear of Current or Ex-Partner:   . Emotionally Abused:   Marland Kitchen Physically Abused:   . Sexually Abused:    family history is not on file.  Current Facility-Administered Medications:  .  cefTRIAXone (ROCEPHIN) 2 g in sodium chloride 0.9 % 100 mL IVPB, 2 g, Intravenous, QHS, Gleason, Otilio Carpen, PA-C, Stopped at 05/30/19 0256 .  Chlorhexidine Gluconate Cloth 2 % PADS 6 each, 6 each, Topical, Daily, Akingbade, Jimi O, MD .  docusate sodium (COLACE) capsule 100 mg, 100 mg, Oral, BID PRN, Gleason, Otilio Carpen, PA-C .  furosemide (LASIX) tablet 40 mg, 40 mg, Oral, Daily, Gleason, Otilio Carpen, PA-C .  insulin aspart (novoLOG) injection 0-6 Units, 0-6 Units, Subcutaneous, Q4H, Gleason, Otilio Carpen, PA-C .  octreotide (SANDOSTATIN) 500 mcg in sodium chloride 0.9 % 250 mL (2 mcg/mL) infusion, 25 mcg/hr, Intravenous, Continuous, Gleason, Otilio Carpen, PA-C, Last Rate: 12.5 mL/hr at 05/30/19 1200, 25 mcg/hr at 05/30/19 1200 .  pantoprazole (PROTONIX) injection 40 mg, 40 mg, Intravenous, Q12H, Gleason, Otilio Carpen, PA-C, 40 mg at 05/30/19 0925 .  polyethylene glycol (MIRALAX / GLYCOLAX) packet 17 g, 17 g, Oral, Daily PRN, Gleason, Otilio Carpen, PA-C .  spironolactone (ALDACTONE) tablet 100 mg, 100 mg, Oral, Daily, Gleason, Otilio Carpen, PA-C No Known Allergies   Objective:     BP (!) 102/58 (BP Location: Right Arm)   Pulse 69   Temp 98 F (36.7 C) (Oral)   Resp 12   Ht 5'  10" (1.778 m)   Wt 62.7 kg   SpO2 93%   BMI 19.83 kg/m   Alert and oriented  No acute distress  Heart regular rhythm no murmurs  Lungs clear  Abdomen soft and nontender    Laboratory No components found for: D1    Assessment:     Recurrent anemia with presenting hemoglobin as mentioned above.  He has been transfused blood and it is up to a hemoglobin of 6.8 today.  Cirrhosis of the liver  Heme positive stool but no signs of any active bleeding and the patient reports that his stools are a tannish color, not red, not black.      Plan:     Transfuse blood as needed.  At this point I am not sure if he needs to undergo repeat endoscopy, colonoscopy, and capsule endoscopy as this was all done recently including a double-balloon enteroscopy at [facility] a few weeks ago.  I  wonder if he could have some bone marrow suppression preventing him from producing blood.  I wonder if he could have a significant iron deficiency keeping him from producing blood or even a B12 and folate deficiency.  These should be checked.  We will follow. Lab Results  Component Value Date   HGB 6.8 (LL) 05/30/2019   HGB 2.6 (LL) 05/29/2019   HGB 2.9 (LL) 05/29/2019   HCT 21.4 (L) 05/30/2019   HCT 10.0 (L) 05/29/2019   HCT 10.6 (L) 05/29/2019   HCT 13.4 (LL) 03/14/2019   ALKPHOS 72 05/29/2019   ALKPHOS 89 04/16/2019   ALKPHOS 72 04/13/2019   AST 30 05/29/2019   AST 44 (H) 04/16/2019   AST 50 (H) 04/13/2019   ALT 19 05/29/2019   ALT 26 04/16/2019   ALT 32 04/13/2019

## 2019-05-30 NOTE — ED Notes (Signed)
Critical Care PA in to assess pt at this time

## 2019-05-30 NOTE — Progress Notes (Addendum)
NAME:  Gavin Arroyo, MRN:  [id number], DOB:  [birth year], LOS: 0 ADMISSION DATE:  05/29/2019, CONSULTATION DATE:  05/30/19 REFERRING MD:  EDP, CHIEF COMPLAINT:  GIB   Brief History   64 y.o. M with PMH of ETOH abuse, remote perforated peptic ulcer, ventral hernia,  Cirrhosis and complicated hospital course for GIB including txfr to [facility] 04/2019 who presents with fatigue. Pt was discharged home three weeks ago and over the last week or so has experienced worsening fatigue without signs of bleeding. Hgb in the ED was 2.6 with soft BP.  PCCM consulted for admission  History of present illness   Gavin Arroyo is a 64 y.o. M PMH of ETOH abuse,remote perforated peptic ulcer, ventral hernia,  Cirrhosis and complicated hospital course for GIB including txfr to [facility] 04/2019   He initially had a brisk LGIB in 03/2019, Hospitalized for 30 days at [facility] with >30 units PRBC's transfused, had a EGD and colonoscopy showed small EV, several polyps.  Pt was discharged home, but returned quickly for rebleed. Capsule study showed diffuse bleeding to the Cecum, so was transferred to [facility].   From [facility] course: "He underwent aDBE on 4/12 that showed a single bleeding angiectasia in the duodenum. 4/13 video capsule showed likely bleeding distal TI. 4/14 CT GI bleed protocol and tagged RBC scan without evidence of active bleed. 4/19: Underwent retrograde BP with APC of angiectasia and colon and ileum. 4/21 underwent colonoscopy that showed two ulcerations, clipped. EGD showed grade 1 esophageal varices and portal hypertensive gastropathy. 4/22 underwent video capsule with no significant source of bleeding found. At this point if he were to bleed again- per GI recommend CT -GI bleed protocol and consulting IR for suspected SMA territory bleed based on prior capsule results for small intestinal bleeding."  Pt reports that he has not seen any dark stools or signs of bleeding, but has noted worsening fatigue over  about the last week to the point he was having difficulty walking and had CP and dyspnea.  Denies fever, LE edema or abdominal pain.  In the ED, pt's Hgb was profoundly low at 2.6, borderline low BP that improved with IFV.  Stool was hemoccult + and 4 units PRBC's ordered. PCCM consulted for admission.    Past Medical History   has a past medical history of GI bleeding (03/2019). Cirrhosis, ETOH abuse   Significant Hospital Events   05/30/19 Admit to PCCM  Consults:  GI  Procedures:    Significant Diagnostic Tests:  5/21 CXR>>Central vascular congestion without overt edema.  Micro Data:  5/21 Sars-Cov-2>>negative   Antimicrobials:  Ceftriaxone 5/22 >>   Interim history/subjective:   Interacting normally Hemodynamically stable Hemoglobin is improved to 6.8 with 4 units PRBC  Objective   Blood pressure (!) 102/58, pulse 72, temperature 98 F (36.7 C), temperature source Oral, resp. rate 20, height 5\' 10"  (1.778 m), weight 62.7 kg, SpO2 93 %.        Intake/Output Summary (Last 24 hours) at 05/30/2019 1423 Last data filed at 05/30/2019 1200 Gross per 24 hour  Intake 3270.43 ml  Output 1100 ml  Net 2170.43 ml   Filed Weights   05/30/19 0200  Weight: 62.7 kg    General: Chronically ill-appearing man, laying in bed in no distress HEENT: Oropharynx slightly pale, moist, no lesions Neuro: Awake, alert, interacting appropriately, moves all extremities, follows commands CV: Regular, distant, no murmur PULM: Clear bilaterally, no wheezing, no crackles GI: Soft, nondistended with  positive bowel sounds Extremities: No significant edema Skin: No rash   Resolved Hospital Problem list     Assessment & Plan:   Acute on chronic anemia, presumed acute blood loss anemia from GIB Note extensive history of upper and lower GIB, presents with profound anemia of 2.6.  Suspect slow UGIB, and has history of grade 1 varices.  Appreciate Dr. Estell Harpin assistance.  Note that patient has  had multiple procedures, upper, lower scope, capsule endoscopy, etc. both at this institution and at [facility].  Source unclear.  Question raised of possible hematopoietic insufficiency.  His B12, folate, iron studies have been reassuring in the past but we should recheck. Could consider hematology consult Follow CBC.  No clear evidence for active blood loss Okay to stop octreotide and ceftriaxone 5/22 Will defer to Dr. Penelope Coop as to whether there is any utility in repeating any invasive GI evaluation He is stable for transition to progressive bed 5/22  History of Cirrhosis, likely alcoholic Pt reports he quit drinking completely several months ago, LFT now normal Continue his home spironolactone and furosemide Okay to stop empiric ceftriaxone on 5/22 Restart home lactulose      Best practice:  Diet: Okay to start a clear diet Pain/Anxiety/Delirium protocol (if indicated): n/a VAP protocol (if indicated): n/a DVT prophylaxis: SCD's GI prophylaxis: Protonix Glucose control: SSI Mobility: bed rest Code Status: full code Family Communication: Plans reviewed with the patient at bedside 5/22 Disposition: To progressive unit on 5/22  Labs   CBC: Recent Labs  Lab 05/29/19 1724 05/29/19 1918 05/30/19 0944  WBC 6.6 5.4 5.1  NEUTROABS 4.1 3.3  --   HGB 2.9* 2.6* 6.8*  HCT 10.6* 10.0* 21.4*  MCV 80.3 83.3 81.7  PLT 200 184 [id number]    Basic Metabolic Panel: Recent Labs  Lab 05/29/19 1724 05/30/19 0944  NA 133* 137  K 4.2 4.1  CL 106 108  CO2 16* 18*  GLUCOSE 136* 103*  BUN 21 17  CREATININE 1.07 0.96  CALCIUM 8.3* 8.3*  MG  --  2.0  PHOS  --  4.8*   GFR: Estimated Creatinine Clearance: 71.7 mL/min (by C-G formula based on SCr of 0.96 mg/dL). Recent Labs  Lab 05/29/19 1724 05/29/19 1918 05/30/19 0944  WBC 6.6 5.4 5.1    Liver Function Tests: Recent Labs  Lab 05/29/19 1724  AST 30  ALT 19  ALKPHOS 72  BILITOT 0.9  PROT 6.0*  ALBUMIN 2.7*   No results for  input(s): LIPASE, AMYLASE in the last 168 hours. No results for input(s): AMMONIA in the last 168 hours.  ABG    Component Value Date/Time   PHART 7.505 (H) 03/30/2019 0501   PCO2ART 18.7 (LL) 03/30/2019 0501   PO2ART 112.0 (H) 03/30/2019 0501   HCO3 14.8 (L) 03/30/2019 0501   TCO2 15 (L) 03/30/2019 0501   ACIDBASEDEF 7.0 (H) 03/30/2019 0501   O2SAT 99.0 03/30/2019 0501     Coagulation Profile: Recent Labs  Lab 05/29/19 2050  INR 1.4*    Cardiac Enzymes: No results for input(s): CKTOTAL, CKMB, CKMBINDEX, TROPONINI in the last 168 hours.  HbA1C: Hgb A1c MFr Bld  Date/Time Value Ref Range Status  05/30/2019 09:44 AM 5.3 4.8 - 5.6 % Final    Comment:    (NOTE) Pre diabetes:          5.7%-6.4% Diabetes:              >6.4% Glycemic control for   <7.0% adults with diabetes  CBG: Recent Labs  Lab 05/30/19 0155 05/30/19 0337 05/30/19 0735 05/30/19 1144  GLUCAP 117* 105* 86 99     Critical care time: 32 minutes      CRITICAL CARE Performed by: Collene Gobble   Total critical care time: 32 minutes  Critical care time was exclusive of separately billable procedures and treating other patients.  Critical care was necessary to treat or prevent imminent or life-threatening deterioration.  Critical care was time spent personally by me on the following activities: development of treatment plan with patient and/or surrogate as well as nursing, discussions with consultants, evaluation of patient's response to treatment, examination of patient, obtaining history from patient or surrogate, ordering and performing treatments and interventions, ordering and review of laboratory studies, ordering and review of radiographic studies, pulse oximetry and re-evaluation of patient's condition.  Baltazar Apo, MD, PhD 05/30/2019, 2:30 PM [facility] [phone] or if no answer [phone]

## 2019-05-30 NOTE — H&P (Signed)
NAME:  Gavin Arroyo, MRN:  [id number], DOB:  10/22/1955, LOS: 0 ADMISSION DATE:  05/29/2019, CONSULTATION DATE:  05/30/19 REFERRING MD:  EDP, CHIEF COMPLAINT:  GIB   Brief History   64 y.o. M with PMH of ETOH abuse, remote perforated peptic ulcer, ventral hernia,  Cirrhosis and complicated hospital course for GIB including txfr to [facility] 04/2019 who presents with fatigue. Pt was discharged home three weeks ago and over the last week or so has experienced worsening fatigue without signs of bleeding. Hgb in the ED was 2.6 with soft BP.  PCCM consulted for admission  History of present illness   Gavin Arroyo is a 64 y.o. M PMH of ETOH abuse,remote perforated peptic ulcer, ventral hernia,  Cirrhosis and complicated hospital course for GIB including txfr to [facility] 04/2019   He initially had a brisk LGIB in 03/2019, Hospitalized for 30 days at [facility] with >30 units PRBC's transfused, had a EGD and colonoscopy showed small EV, several polyps.  Pt was discharged home, but returned quickly for rebleed. Capsule study showed diffuse bleeding to the Cecum, so was transferred to [facility].   From [facility] course: "He underwent aDBE on 4/12 that showed a single bleeding angiectasia in the duodenum. 4/13 video capsule showed likely bleeding distal TI. 4/14 CT GI bleed protocol and tagged RBC scan without evidence of active bleed. 4/19: Underwent retrograde BP with APC of angiectasia and colon and ileum. 4/21 underwent colonoscopy that showed two ulcerations, clipped. EGD showed grade 1 esophageal varices and portal hypertensive gastropathy. 4/22 underwent video capsule with no significant source of bleeding found. At this point if he were to bleed again- per GI recommend CT -GI bleed protocol and consulting IR for suspected SMA territory bleed based on prior capsule results for small intestinal bleeding."  Pt reports that he has not seen any dark stools or signs of bleeding, but has noted worsening fatigue over  about the last week to the point he was having difficulty walking and had CP and dyspnea.  Denies fever, LE edema or abdominal pain.  In the ED, pt's Hgb was profoundly low at 2.6, borderline low BP that improved with IFV.  Stool was hemoccult + and 4 units PRBC's ordered. PCCM consulted for admission.    Past Medical History   has a past medical history of GI bleeding (03/2019). Cirrhosis, ETOH abuse   Significant Hospital Events   05/30/19 Admit to PCCM  Consults:  GI  Procedures:    Significant Diagnostic Tests:  5/21 CXR>>Central vascular congestion without overt edema.  Micro Data:  5/21 Sars-Cov-2>>negative   Antimicrobials:  Ceftriaxone 5/22-   Interim history/subjective:  Pt is awake, alert, talkative and feels improved after one unit PRBC's  Objective   Blood pressure (!) 110/57, pulse 80, temperature 98 F (36.7 C), temperature source Oral, resp. rate 16, SpO2 100 %.        Intake/Output Summary (Last 24 hours) at 05/30/2019 0102 Last data filed at 05/30/2019 0044 Gross per 24 hour  Intake 1945 ml  Output 500 ml  Net 1445 ml   There were no vitals filed for this visit.  General:  Chronically ill-appearing M in no acute distress HEENT: MM pink/moist Neuro: awake, alert, oriented x3 and following commands CV: s1s2 rrr, no m/r/g PULM:  CTAB GI: soft, bsx4 active  Extremities: warm/dry, no edema  Skin: no rashes or lesions   Resolved Hospital Problem list     Assessment & Plan:  Acute blood loss anemia, GIB Patient with extensive history of upper and lower GIB who presents with profound anemia of 2.6.  Suspect slow UGIB, and has history of grade 1 varices. GI was contacted by the ED and will see patient in the AM -Continue transfusing 4 units PRBC's with q6hr CBC transfuse prn -NPO, Protonix 40mg  bid -start Octreotide, has history of liver cirrhosis, cover empirically with Ceftriaxone -[facility] contacted by ED and to see in the AM -hemodynamically  stable   History of Cirrhosis, likely alcoholic Pt reports he quit drinking completely 2 months ago, LFT's WNL today -Abdomen soft, continue Spironolactone and Lasix -empiric ceftriaxone      Best practice:  Diet: NPO Pain/Anxiety/Delirium protocol (if indicated): n/a VAP protocol (if indicated): n/a DVT prophylaxis: SCD's GI prophylaxis: Protonix Glucose control: SSI Mobility: bed rest Code Status: full code Family Communication: pt able to communicate Disposition: ICU  Labs   CBC: Recent Labs  Lab 05/29/19 1724 05/29/19 1918  WBC 6.6 5.4  NEUTROABS 4.1 3.3  HGB 2.9* 2.6*  HCT 10.6* 10.0*  MCV 80.3 83.3  PLT 200 [id number]    Basic Metabolic Panel: Recent Labs  Lab 05/29/19 1724  NA 133*  K 4.2  CL 106  CO2 16*  GLUCOSE 136*  BUN 21  CREATININE 1.07  CALCIUM 8.3*   GFR: CrCl cannot be calculated (Unknown ideal weight.). Recent Labs  Lab 05/29/19 1724 05/29/19 1918  WBC 6.6 5.4    Liver Function Tests: Recent Labs  Lab 05/29/19 1724  AST 30  ALT 19  ALKPHOS 72  BILITOT 0.9  PROT 6.0*  ALBUMIN 2.7*   No results for input(s): LIPASE, AMYLASE in the last 168 hours. No results for input(s): AMMONIA in the last 168 hours.  ABG    Component Value Date/Time   PHART 7.505 (H) 03/30/2019 0501   PCO2ART 18.7 (LL) 03/30/2019 0501   PO2ART 112.0 (H) 03/30/2019 0501   HCO3 14.8 (L) 03/30/2019 0501   TCO2 15 (L) 03/30/2019 0501   ACIDBASEDEF 7.0 (H) 03/30/2019 0501   O2SAT 99.0 03/30/2019 0501     Coagulation Profile: Recent Labs  Lab 05/29/19 2050  INR 1.4*    Cardiac Enzymes: No results for input(s): CKTOTAL, CKMB, CKMBINDEX, TROPONINI in the last 168 hours.  HbA1C: No results found for: HGBA1C  CBG: No results for input(s): GLUCAP in the last 168 hours.  Review of Systems:   Negative except as noted in HPI  Past Medical History  He,  has a past medical history of GI bleeding (03/2019).   Surgical History    Past Surgical  History:  Procedure Laterality Date  . COLONOSCOPY WITH PROPOFOL N/A 03/16/2019   Procedure: COLONOSCOPY WITH PROPOFOL;  Surgeon: Ronnette Juniper, MD;  Location: [place];  Service: Gastroenterology;  Laterality: N/A;  . COLONOSCOPY WITH PROPOFOL N/A 03/20/2019   Procedure: COLONOSCOPY WITH PROPOFOL;  Surgeon: Ronnette Juniper, MD;  Location: [place];  Service: Gastroenterology;  Laterality: N/A;  . COLONOSCOPY WITH PROPOFOL N/A 04/07/2019   Procedure: COLONOSCOPY WITH PROPOFOL;  Surgeon: Clarene Essex, MD;  Location: [place];  Service: Endoscopy;  Laterality: N/A;  . ESOPHAGOGASTRODUODENOSCOPY (EGD) WITH PROPOFOL N/A 03/16/2019   Procedure: ESOPHAGOGASTRODUODENOSCOPY (EGD) WITH PROPOFOL;  Surgeon: Ronnette Juniper, MD;  Location: [place];  Service: Gastroenterology;  Laterality: N/A;  . ESOPHAGOGASTRODUODENOSCOPY (EGD) WITH PROPOFOL N/A 04/02/2019   Procedure: ESOPHAGOGASTRODUODENOSCOPY (EGD) WITH PROPOFOL;  Surgeon: Wilford Corner, MD;  Location: [place];  Service: Endoscopy;  Laterality: N/A;  .  ESOPHAGOGASTRODUODENOSCOPY (EGD) WITH PROPOFOL N/A 04/12/2019   Procedure: ESOPHAGOGASTRODUODENOSCOPY (EGD) WITH PROPOFOL;  Surgeon: Clarene Essex, MD;  Location: [place];  Service: Endoscopy;  Laterality: N/A;  And possible capsule endoscopy as well  . GIVENS CAPSULE STUDY N/A 04/12/2019   Procedure: GIVENS CAPSULE STUDY;  Surgeon: Clarene Essex, MD;  Location: [place];  Service: Endoscopy;  Laterality: N/A;  . HEMOSTASIS CLIP PLACEMENT  03/20/2019   Procedure: HEMOSTASIS CLIP PLACEMENT;  Surgeon: Ronnette Juniper, MD;  Location: [facility] ENDOSCOPY;  Service: Gastroenterology;;  . HEMOSTASIS CONTROL  03/20/2019   Procedure: HEMOSTASIS CONTROL;  Surgeon: Ronnette Juniper, MD;  Location: [facility] ENDOSCOPY;  Service: Gastroenterology;;  . POLYPECTOMY  03/16/2019   Procedure: POLYPECTOMY;  Surgeon: Ronnette Juniper, MD;  Location: [facility] ENDOSCOPY;  Service: Gastroenterology;;  . POLYPECTOMY  03/20/2019   Procedure: POLYPECTOMY;   Surgeon: Ronnette Juniper, MD;  Location: [facility] ENDOSCOPY;  Service: Gastroenterology;;  . REPAIR OF PERFORATED ULCER  2010  ?     Social History   reports that he has been smoking cigarettes. He has been smoking about 0.25 packs per day. He has never used smokeless tobacco. He reports previous alcohol use of about 1.0 standard drinks of alcohol per week. He reports current drug use. Drug: Cocaine.   Family History   His family history is not on file.   Allergies No Known Allergies   Home Medications  Prior to Admission medications   Medication Sig Start Date End Date Taking? Authorizing Provider  acetaminophen (TYLENOL) 325 MG tablet Take 2 tablets (650 mg total) by mouth every 6 (six) hours as needed for mild pain (or Fever >/= 101). 04/17/19   Earlene Plater, MD  acetaminophen (TYLENOL) 650 MG suppository Place 1 suppository (650 mg total) rectally every 6 (six) hours as needed for mild pain (or Fever >/= 101). 04/17/19   Earlene Plater, MD  Amino Acids-Protein Hydrolys (FEEDING SUPPLEMENT, PRO-STAT SUGAR FREE 64,) LIQD Take 30 mLs by mouth 3 (three) times daily. 04/17/19   Earlene Plater, MD  chlorhexidine (PERIDEX) 0.12 % solution 15 mLs by Mouth Rinse route 2 (two) times daily. 04/17/19   Earlene Plater, MD  feeding supplement, ENSURE ENLIVE, (ENSURE ENLIVE) LIQD Take 237 mLs by mouth 2 (two) times daily between meals. 04/18/19   Earlene Plater, MD  folic acid (FOLVITE) 1 MG tablet Take 1 tablet (1 mg total) by mouth daily. 04/18/19   Earlene Plater, MD  furosemide (LASIX) 40 MG tablet Take 1 tablet (40 mg total) by mouth daily. 03/29/19   Jean Rosenthal, MD  lactulose (CHRONULAC) 10 GM/15ML solution Take 45 mLs (30 g total) by mouth 3 (three) times daily. 04/17/19   Earlene Plater, MD  midodrine (PROAMATINE) 10 MG tablet Take 1 tablet (10 mg total) by mouth 3 (three) times daily with meals. 04/17/19   Earlene Plater, MD  mouth rinse LIQD solution 15 mLs by Mouth Rinse route 2 times  daily at 12 noon and 4 pm. 04/17/19   Earlene Plater, MD  Multiple Vitamin (MULTIVITAMIN WITH MINERALS) TABS tablet Take 1 tablet by mouth daily. 04/18/19   Earlene Plater, MD  pantoprazole (PROTONIX) 40 MG tablet Take 1 tablet (40 mg total) by mouth 2 (two) times daily. 04/17/19   Earlene Plater, MD  polyvinyl alcohol (LIQUIFILM TEARS) 1.4 % ophthalmic solution Place 1 drop into both eyes as needed for dry eyes. 04/17/19   Earlene Plater, MD  promethazine (PHENERGAN) 12.5 MG tablet Take 1 tablet (12.5 mg total) by mouth every 6 (six)  hours as needed for nausea. 04/17/19   Earlene Plater, MD  senna-docusate (SENOKOT-S) 8.6-50 MG tablet Take 1 tablet by mouth at bedtime as needed for mild constipation. 04/17/19   Earlene Plater, MD  sodium chloride (OCEAN) 0.65 % SOLN nasal spray Place 1 spray into both nostrils as needed for congestion (nose irritation). 04/17/19   Earlene Plater, MD  spironolactone (ALDACTONE) 100 MG tablet Take 1 tablet (100 mg total) by mouth daily. 03/29/19   Jean Rosenthal, MD  sucralfate (CARAFATE) 1 GM/10ML suspension Take 10 mLs (1 g total) by mouth 4 (four) times daily -  with meals and at bedtime. 04/17/19   Earlene Plater, MD  thiamine 100 MG tablet Take 1 tablet (100 mg total) by mouth daily. 04/18/19   Earlene Plater, MD     Critical care time: 50 minutes      CRITICAL CARE Performed by: Otilio Carpen Gleason   Total critical care time: 50 minutes  Critical care time was exclusive of separately billable procedures and treating other patients.  Critical care was necessary to treat or prevent imminent or life-threatening deterioration.  Critical care was time spent personally by me on the following activities: development of treatment plan with patient and/or surrogate as well as nursing, discussions with consultants, evaluation of patient's response to treatment, examination of patient, obtaining history from patient or surrogate, ordering and performing  treatments and interventions, ordering and review of laboratory studies, ordering and review of radiographic studies, pulse oximetry and re-evaluation of patient's condition.   Otilio Carpen Gleason, PA-C

## 2019-05-30 NOTE — Progress Notes (Signed)
eLink Physician-Brief Progress Note Patient Name: Gavin Arroyo DOB: October 25, 1955 MRN: [id number]   Date of Service  05/30/2019  HPI/Events of Note  Repeat Hgb = 7.1.   eICU Interventions  Will hold off on further blood transfusion at this time.      Intervention Category Major Interventions: Other:  Lysle Dingwall 05/30/2019, 11:26 PM

## 2019-05-30 NOTE — Progress Notes (Signed)
eLink Physician-Brief Progress Note Patient Name: ZAKARIYA OHERRON DOB: 05-22-1955 MRN: [id number]   Date of Service  05/30/2019  HPI/Events of Note  Anemia - Hgb = 6.8.   eICU Interventions  Will transfuse 1 unit PRBC now.      Intervention Category Major Interventions: Other:  Lysle Dingwall 05/30/2019, 9:06 PM

## 2019-05-30 NOTE — Progress Notes (Signed)
Initial Nutrition Assessment  DOCUMENTATION CODES:   Not applicable  INTERVENTION:   Await diet advancement; recommend Ensure Enlive po TID, each supplement provides 350 kcal and 20 grams of protein, once diet advanced  Consider early initiation of TPN if unable to advance diet given suspected malnutrition   NUTRITION DIAGNOSIS:   Inadequate oral intake related to acute illness, altered GI function as evidenced by NPO status.  GOAL:   Patient will meet greater than or equal to 90% of their needs  MONITOR:   Diet advancement, PO intake, Labs, Weight trends  REASON FOR ASSESSMENT:   Malnutrition Screening Tool    ASSESSMENT:   64 yo male admitted with acute blood loss anemia with hgb of 2.6 with suspected slow UGIB. Pt recently discharged from [facility] approx 3 weeks ago after extensive workup for GI bleeding with no definitive source found. PMH includes alcoholic liver cirrhosis, grade 1 esophageal varices, hx of perforated peptic ulcer, GI bleed   RD working remotely.  Pt receiving PRBCs, GI consult pending  Pt currently NPO. Unable to obtain diet and weight history from patient at this time.   Current weight 62.7 kg; Per weight encounters, pt has experienced weight loss over the past month. >8% wt loss.   Unable to perform NFPE at this time  Suspect patient may be malnourished based on recent hx of hospitalization with GI bleed and weight loss, hx of EtOH abuse. If able to confirm malnutrition and unable to advance diet soon, recommend considering early initiation of TPN  Labs: CBGs 86-117 Meds: ss novolog, lasix   Diet Order:   Diet Order            Diet NPO time specified  Diet effective now              EDUCATION NEEDS:   Not appropriate for education at this time  Skin:  Skin Assessment: Reviewed RN Assessment  Last BM:  5/21  Height:   Ht Readings from Last 1 Encounters:  05/30/19 5\' 10"  (1.778 m)    Weight:   Wt Readings from Last 1  Encounters:  05/30/19 62.7 kg    BMI:  Body mass index is 19.83 kg/m.  Estimated Nutritional Needs:   Kcal:  1900-2200 kcals  Protein:  95-110 g  Fluid:  >/= 1.9 L   Kerman Passey MS, RDN, LDN, CNSC RD Pager Number and RD On-Call Pager Number Located in [place]

## 2019-05-31 DIAGNOSIS — D649 Anemia, unspecified: Secondary | ICD-10-CM

## 2019-05-31 LAB — GLUCOSE, CAPILLARY
Glucose-Capillary: 102 mg/dL — ABNORMAL HIGH (ref 70–99)
Glucose-Capillary: 112 mg/dL — ABNORMAL HIGH (ref 70–99)
Glucose-Capillary: 112 mg/dL — ABNORMAL HIGH (ref 70–99)
Glucose-Capillary: 118 mg/dL — ABNORMAL HIGH (ref 70–99)
Glucose-Capillary: 75 mg/dL (ref 70–99)
Glucose-Capillary: 94 mg/dL (ref 70–99)

## 2019-05-31 LAB — BASIC METABOLIC PANEL
Anion gap: 5 (ref 5–15)
BUN: 13 mg/dL (ref 8–23)
CO2: 19 mmol/L — ABNORMAL LOW (ref 22–32)
Calcium: 7.8 mg/dL — ABNORMAL LOW (ref 8.9–10.3)
Chloride: 113 mmol/L — ABNORMAL HIGH (ref 98–111)
Creatinine, Ser: 0.96 mg/dL (ref 0.61–1.24)
GFR calc Af Amer: >60 mL/min (ref >60)
GFR calc non Af Amer: >60 mL/min (ref >60)
Glucose, Bld: 103 mg/dL — ABNORMAL HIGH (ref 70–99)
Potassium: 3.7 mmol/L (ref 3.5–5.1)
Sodium: 137 mmol/L (ref 135–145)

## 2019-05-31 LAB — CBC
HCT: 21 % — ABNORMAL LOW (ref 39.0–52.0)
Hemoglobin: 6.5 g/dL — CL (ref 13.0–17.0)
MCH: 25.8 pg — ABNORMAL LOW (ref 26.0–34.0)
MCHC: 31 g/dL (ref 30.0–36.0)
MCV: 83.3 fL (ref 80.0–100.0)
Platelets: 167 10*3/uL (ref 150–400)
RBC: 2.52 MIL/uL — ABNORMAL LOW (ref 4.22–5.81)
RDW: 17.6 % — ABNORMAL HIGH (ref 11.5–15.5)
WBC: 5.5 10*3/uL (ref 4.0–10.5)
nRBC: 0.4 % — ABNORMAL HIGH (ref 0.0–0.2)

## 2019-05-31 LAB — HEMOGLOBIN AND HEMATOCRIT, BLOOD
HCT: 26.4 % — ABNORMAL LOW (ref 39.0–52.0)
Hemoglobin: 8.4 g/dL — ABNORMAL LOW (ref 13.0–17.0)

## 2019-05-31 LAB — PREPARE RBC (CROSSMATCH)

## 2019-05-31 MED ORDER — SODIUM CHLORIDE 0.9% IV SOLUTION: Freq: Once | INTRAVENOUS | Status: DC

## 2019-05-31 MED ORDER — ACETAMINOPHEN 325 MG PO TABS: 650.0000 mg | ORAL_TABLET | Freq: Four times a day (QID) | ORAL | Status: DC | PRN

## 2019-05-31 MED ORDER — ACETAMINOPHEN 325 MG PO TABS
650.0000 mg | ORAL_TABLET | Freq: Four times a day (QID) | ORAL | Status: DC | PRN
  Administered 2019-05-31: 650 mg via ORAL
  Filled 2019-05-31: qty 2

## 2019-05-31 MED ORDER — SODIUM CHLORIDE 0.9 % IV SOLN: 510.0000 mg | Freq: Once | INTRAVENOUS | Status: DC

## 2019-05-31 MED ORDER — SODIUM CHLORIDE 0.9 % IV SOLN
510.0000 mg | Freq: Once | INTRAVENOUS | Status: AC
  Administered 2019-05-31: 510 mg via INTRAVENOUS
  Filled 2019-05-31: qty 17

## 2019-05-31 MED ORDER — FERROUS SULFATE 325 (65 FE) MG PO TABS
325.0000 mg | ORAL_TABLET | Freq: Three times a day (TID) | ORAL | Status: DC
  Administered 2019-05-31 – 2019-06-02 (×6): 325 mg via ORAL
  Filled 2019-05-31 (×7): qty 1

## 2019-05-31 NOTE — Progress Notes (Signed)
Paged by RN that patient has a headache rating 5/10 and no PRNs. Reviewed chart. Does have hx of cirrhosis by LFT's WNL. Tylenol ordered PRN.  Barrington Ellison, MD [facility]

## 2019-05-31 NOTE — Progress Notes (Signed)
PROGRESS NOTE    Gavin Arroyo  [id number] DOB: 11/11/1955 DOA: 05/29/2019 PCP: Patient, No Pcp Per    Chief Complaint  Patient presents with  . Weakness    Brief Narrative:  Patient is a 64 y.o. M PMH of ETOH abuse,remote perforated peptic ulcer, ventral hernia, cirrhosis and complicated hospital course for GIB including txfr to [facility] 04/2019. He initially had a brisk LGIB in 03/2019, Hospitalized for 30 days at [facility] with >30 units PRBC's transfused, had a EGD and colonoscopy showed small EV, several polyps.  Pt was discharged home, but returned quickly for rebleed. Capsule study showed diffuse bleeding to the Cecum, so was transferred to [facility].   From [facility] course: "He underwent aDBE on 4/12 that showed a single bleeding angiectasia in the duodenum. 4/13 video capsule showed likely bleeding distal TI. 4/14 CT GI bleed protocol and tagged RBC scan without evidence of active bleed. 4/19: Underwent retrograde BP with APC of angiectasia and colon and ileum. 4/21 underwent colonoscopy that showed two ulcerations, clipped. EGD showed grade 1 esophageal varices and portal hypertensive gastropathy. 4/22 underwent video capsule with no significant source of bleeding found. At this point if he were to bleed again- per GI recommend CT -GI bleed protocol and consulting IR for suspected SMA territory bleed based on prior capsule results for small intestinal bleeding."  Pt denied any dark stools or signs of bleeding, but has noted worsening fatigue over about the last week to the point he was having difficulty walking and had CP and dyspnea.  Denied fever, LE edema or abdominal pain.  In the ED, pt's Hgb was profoundly low at 2.6, borderline low BP that improved with fluids.  Stool was hemoccult + and 4 units PRBC's ordered.  Patient was admitted to [facility].  He was also initially started on octreotide, SBP prophylaxis with ceftriaxone which have been discontinued. This morning his  hemoglobin was 6.5.  He is being transferred to the hospitalist service.  1 unit PRBC ordered.   Assessment & Plan:   Active Problems:   Anemia   GIB (gastrointestinal bleeding)  Acute on chronic anemia: Likely secondary to GI bleed.  Patient has extensive history of upper and lower GI bleed and presented with anemia, hemoglobin 2.6.  Likely slow GI bleed.  GI has been consulted.  He has history of grade 1 esophageal varices.  He has had multiple procedures including upper and lower scope, capsule endoscopy at [facility].  Source unclear.  Suspect AVMs.  Iron studies ordered per GI recommendation.  Currently denies having any blood in stool. -Hemoglobin this morning 6.5.  1 unit PRBC ordered.  Will order repeat H&H at 2 PM.  History of liver cirrhosis: Likely secondary to alcohol.  Patient denies any current alcohol use and says he quit couple of months ago. -On Aldactone, furosemide.  Continue.  Home lactulose restarted.  DVT prophylaxis: SCDs given GI bleed Code Status: Full code Family Communication: No family at bedside  Disposition: Unknown at this time  Status is: Inpatient  Remains inpatient appropriate because:GI bleed with severe anemia   Dispo: The patient is from: Home              Anticipated d/c is to: Home              Anticipated d/c date is: 2 days              Patient currently is not medically stable to d/c.  Consultants:   Gastroenterology   Procedures:   None   Antimicrobials:  Anti-infectives (From admission, onward)   Start     Dose/Rate Route Frequency Ordered Stop   05/30/19 0130  cefTRIAXone (ROCEPHIN) 2 g in sodium chloride 0.9 % 100 mL IVPB  Status:  Discontinued     2 g 200 mL/hr over 30 Minutes Intravenous Daily at bedtime 05/30/19 0111 05/30/19 1429         Subjective: Complaining of fatigue.  Hemoglobin this morning 6.5.  At the time of admission it was 2.6.  Objective: Vitals:   05/31/19 0910 05/31/19 1000 05/31/19 1100  05/31/19 1200  BP: 115/81 (!) 102/47 (!) 98/51 115/70  Pulse: 73 74 84 77  Resp: 13 14 15 14   Temp: 98.4 F (36.9 C)  98.3 F (36.8 C)   TempSrc: Oral  Oral   SpO2: 98% 97% 99% 98%  Weight:      Height:        Intake/Output Summary (Last 24 hours) at 05/31/2019 1412 Last data filed at 05/31/2019 1350 Gross per 24 hour  Intake 675 ml  Output 850 ml  Net -175 ml   Filed Weights   05/30/19 0200  Weight: 62.7 kg    Examination:  General exam: Appears calm and comfortable  Respiratory system: Clear to auscultation. Respiratory effort normal. Cardiovascular system: S1 & S2 heard, RRR. No JVD, murmurs, rubs, gallops or clicks. No pedal edema. Gastrointestinal system: He has abdominal hernia, soft and nontender. Normal bowel sounds heard. Central nervous system: Alert and oriented. No focal neurological deficits. Extremities: Symmetric 5 x 5 power. Skin: No rashes, lesions or ulcers Psychiatry: Judgement and insight appear normal. Mood & affect appropriate.     Data Reviewed: I have personally reviewed following labs and imaging studies  CBC: Recent Labs  Lab 05/29/19 1724 05/29/19 1724 05/29/19 1918 05/30/19 0944 05/30/19 2056 05/31/19 0344 05/31/19 1329  WBC 6.6  --  5.4 5.1 6.3 5.5  --   NEUTROABS 4.1  --  3.3  --   --   --   --   HGB 2.9*   < > 2.6* 6.8* 7.1* 6.5* 8.4*  HCT 10.6*   < > 10.0* 21.4* 22.4* 21.0* 26.4*  MCV 80.3  --  83.3 81.7 82.7 83.3  --   PLT 200  --  184 169 179 167  --    < > = values in this interval not displayed.    Basic Metabolic Panel: Recent Labs  Lab 05/29/19 1724 05/30/19 0944 05/31/19 0344  NA 133* 137 137  K 4.2 4.1 3.7  CL 106 108 113*  CO2 16* 18* 19*  GLUCOSE 136* 103* 103*  BUN 21 17 13   CREATININE 1.07 0.96 0.96  CALCIUM 8.3* 8.3* 7.8*  MG  --  2.0  --   PHOS  --  4.8*  --     GFR: Estimated Creatinine Clearance: 71.7 mL/min (by C-G formula based on SCr of 0.96 mg/dL).  Liver Function Tests: Recent Labs    Lab 05/29/19 1724  AST 30  ALT 19  ALKPHOS 72  BILITOT 0.9  PROT 6.0*  ALBUMIN 2.7*    CBG: Recent Labs  Lab 05/30/19 1954 05/31/19 0001 05/31/19 0436 05/31/19 0742 05/31/19 1159  GLUCAP 153* 112* 118* 112* 94     Recent Results (from the past 240 hour(s))  SARS Coronavirus 2 by RT PCR (hospital order, performed in [facility] hospital lab) Nasopharyngeal Nasopharyngeal Swab  Status: None   Collection Time: 05/29/19  9:41 PM   Specimen: Nasopharyngeal Swab  Result Value Ref Range Status   SARS Coronavirus 2 NEGATIVE NEGATIVE Final    Comment: (NOTE) SARS-CoV-2 target nucleic acids are NOT DETECTED. The SARS-CoV-2 RNA is generally detectable in upper and lower respiratory specimens during the acute phase of infection. The lowest concentration of SARS-CoV-2 viral copies this assay can detect is 250 copies / mL. A negative result does not preclude SARS-CoV-2 infection and should not be used as the sole basis for treatment or other patient management decisions.  A negative result may occur with improper specimen collection / handling, submission of specimen other than nasopharyngeal swab, presence of viral mutation(s) within the areas targeted by this assay, and inadequate number of viral copies (<250 copies / mL). A negative result must be combined with clinical observations, patient history, and epidemiological information. Fact Sheet for Patients:   StrictlyIdeas.no Fact Sheet for Healthcare Providers: BankingDealers.co.za This test is not yet approved or cleared  by the [place] FDA and has been authorized for detection and/or diagnosis of SARS-CoV-2 by FDA under an Emergency Use Authorization (EUA).  This EUA will remain in effect (meaning this test can be used) for the duration of the COVID-19 declaration under Section 564(b)(1) of the Act, 21 U.S.C. section 360bbb-3(b)(1), unless the authorization is  terminated or revoked sooner. Performed at [facility], [place] [address] [place], [place] [address]   MRSA PCR Screening     Status: None   Collection Time: 05/30/19  1:58 AM   Specimen: Nasal Mucosa; Nasopharyngeal  Result Value Ref Range Status   MRSA by PCR NEGATIVE NEGATIVE Final    Comment:        The GeneXpert MRSA Assay (FDA approved for NASAL specimens only), is one component of a comprehensive MRSA colonization surveillance program. It is not intended to diagnose MRSA infection nor to guide or monitor treatment for MRSA infections. Performed at [facility], [place] [address] [place], [place] [address]          Radiology Studies: DG Chest 2 View  Result Date: 05/29/2019 CLINICAL DATA:  Progressive weakness over 1 month, short of breath, anemia, cirrhosis EXAM: CHEST - 2 VIEW COMPARISON:  03/29/2019 FINDINGS: Frontal and lateral views of the chest demonstrate a stable enlarged cardiac silhouette. Mild central vascular congestion. No airspace disease, effusion, or pneumothorax. No acute bony abnormalities. Prior bilateral healed rib fractures. IMPRESSION: 1. Central vascular congestion without overt edema. Electronically Signed   By: Randa Ngo M.D.   On: 05/29/2019 18:56        Scheduled Meds: . sodium chloride   Intravenous Once  . sodium chloride   Intravenous Once  . Chlorhexidine Gluconate Cloth  6 each Topical Daily  . furosemide  40 mg Oral Daily  . insulin aspart  0-6 Units Subcutaneous Q4H  . lactulose  30 g Oral TID  . pantoprazole (PROTONIX) IV  40 mg Intravenous Q12H  . spironolactone  100 mg Oral Daily   Continuous Infusions:   LOS: 1 day     Yaakov Guthrie, MD [facility]   To contact the attending provider between 7A-7P or the covering provider during after hours 7P-7A, please log into the web site [email or web address] and access using universal [place] password for that web site. If you do not have the password, please  call the hospital operator.  05/31/2019, 2:12 PM

## 2019-05-31 NOTE — Progress Notes (Signed)
[facility] Progress Note  Subjective: The patient feels fine.  There has been no report of any obvious gastrointestinal bleeding since this admission or since his last discharge according to him.  I asked him if he had been on iron supplementation when he went home from his last hospitalization and he did not recall this and I looked at his medications that were listed when he came in the hospital and I do not see iron as one of them.  Objective: Vital signs in last 24 hours: Temp:  [97.6 F (36.4 C)-98.4 F (36.9 C)] 98.4 F (36.9 C) (05/23 0910) Pulse Rate:  [69-77] 74 (05/23 1000) Resp:  [12-28] 14 (05/23 1000) BP: (92-115)/(41-81) 102/47 (05/23 1000) SpO2:  [93 %-100 %] 97 % (05/23 1000) Weight change:    PE:  No distress alert and oriented  Lab Results: Results for orders placed or performed during the hospital encounter of 05/29/19 (from the past 24 hour(s))  Glucose, capillary     Status: None   Collection Time: 05/30/19 11:44 AM  Result Value Ref Range   Glucose-Capillary 99 70 - 99 mg/dL  Glucose, capillary     Status: Abnormal   Collection Time: 05/30/19  3:36 PM  Result Value Ref Range   Glucose-Capillary 133 (H) 70 - 99 mg/dL  Folate, serum, performed at [facility] lab     Status: None   Collection Time: 05/30/19  4:07 PM  Result Value Ref Range   Folate 29.2 >5.9 ng/mL  Vitamin B12     Status: None   Collection Time: 05/30/19  4:07 PM  Result Value Ref Range   Vitamin B-12 561 180 - 914 pg/mL  Iron and TIBC     Status: Abnormal   Collection Time: 05/30/19  4:07 PM  Result Value Ref Range   Iron 22 (L) 45 - 182 ug/dL   TIBC 431 250 - 450 ug/dL   Saturation Ratios 5 (L) 17.9 - 39.5 %   UIBC 409 ug/dL  Ferritin     Status: Abnormal   Collection Time: 05/30/19  4:07 PM  Result Value Ref Range   Ferritin 16 (L) 24 - 336 ng/mL  Technologist smear review     Status: None   Collection Time: 05/30/19  4:07 PM  Result Value Ref Range   WBC  Morphology MORPHOLOGY UNREMARKABLE    RBC Morphology BURR CELLS   Glucose, capillary     Status: Abnormal   Collection Time: 05/30/19  7:54 PM  Result Value Ref Range   Glucose-Capillary 153 (H) 70 - 99 mg/dL   Comment 1 Notify RN   CBC     Status: Abnormal   Collection Time: 05/30/19  8:56 PM  Result Value Ref Range   WBC 6.3 4.0 - 10.5 K/uL   RBC 2.71 (L) 4.22 - 5.81 MIL/uL   Hemoglobin 7.1 (L) 13.0 - 17.0 g/dL   HCT 22.4 (L) 39.0 - 52.0 %   MCV 82.7 80.0 - 100.0 fL   MCH 26.2 26.0 - 34.0 pg   MCHC 31.7 30.0 - 36.0 g/dL   RDW 17.3 (H) 11.5 - 15.5 %   Platelets 179 150 - 400 K/uL   nRBC 0.3 (H) 0.0 - 0.2 %  Prepare RBC (crossmatch)     Status: None   Collection Time: 05/30/19  9:06 PM  Result Value Ref Range   Order Confirmation      ORDER PROCESSED BY BLOOD BANK Performed at [facility], [address], [place], [place] [address]   Glucose, capillary     Status: Abnormal   Collection Time: 05/31/19 12:01 AM  Result Value Ref Range   Glucose-Capillary 112 (H) 70 - 99 mg/dL  Basic metabolic panel     Status: Abnormal   Collection Time: 05/31/19  3:44 AM  Result Value Ref Range   Sodium 137 135 - 145 mmol/L   Potassium 3.7 3.5 - 5.1 mmol/L   Chloride 113 (H) 98 - 111 mmol/L   CO2 19 (L) 22 - 32 mmol/L   Glucose, Bld 103 (H) 70 - 99 mg/dL   BUN 13 8 - 23 mg/dL   Creatinine, Ser 0.96 0.61 - 1.24 mg/dL   Calcium 7.8 (L) 8.9 - 10.3 mg/dL   GFR calc non Af Amer >60 >60 mL/min   GFR calc Af Amer >60 >60 mL/min   Anion gap 5 5 - 15  CBC     Status: Abnormal   Collection Time: 05/31/19  3:44 AM  Result Value Ref Range   WBC 5.5 4.0 - 10.5 K/uL   RBC 2.52 (L) 4.22 - 5.81 MIL/uL   Hemoglobin 6.5 (LL) 13.0 - 17.0 g/dL   HCT 21.0 (L) 39.0 - 52.0 %   MCV 83.3 80.0 - 100.0 fL   MCH 25.8 (L) 26.0 - 34.0 pg   MCHC 31.0 30.0 - 36.0 g/dL   RDW 17.6 (H) 11.5 - 15.5 %   Platelets 167 150 - 400 K/uL   nRBC 0.4 (H) 0.0 - 0.2 %  Glucose, capillary     Status: Abnormal    Collection Time: 05/31/19  4:36 AM  Result Value Ref Range   Glucose-Capillary 118 (H) 70 - 99 mg/dL   Comment 1 Notify RN   Prepare RBC (crossmatch)     Status: None   Collection Time: 05/31/19  4:59 AM  Result Value Ref Range   Order Confirmation      BB SAMPLE OR UNITS ALREADY AVAILABLE Performed at [facility], [address], [place], [place] [address]   Glucose, capillary     Status: Abnormal   Collection Time: 05/31/19  7:42 AM  Result Value Ref Range   Glucose-Capillary 112 (H) 70 - 99 mg/dL    Studies/Results: No results found.    Assessment: Iron deficiency anemia.  His iron stores are low per lab results.  Heme positive stool.  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  Review of prior records shows that he has been extensively evaluated recently.  More than likely he has been unable to produce his own blood given lack of iron stores.  Plan:   I would recommend supplemental iron therapy and replenish his iron supplies at this time.  Follow clinically.  Follow hemoglobin and hematocrit    Gavin Arroyo 05/31/2019, 11:26 AM  Pager: [phone] If no answer or after 5 PM call [phone]

## 2019-05-31 NOTE — Progress Notes (Signed)
Patient complaining of headache 5/10.  No PRN orders. Notified on call.  Will continue to monitor the patient

## 2019-05-31 NOTE — Progress Notes (Signed)
eLink Physician-Brief Progress Note Patient Name: MOATAZ KERWIN DOB: 1955-11-06 MRN: [id number]   Date of Service  05/31/2019  HPI/Events of Note  Anemia - Hgb = 6.5.   eICU Interventions  Will transfuse 1 unit PRBC.      Intervention Category Major Interventions: Other:  Lysle Dingwall 05/31/2019, 4:59 AM

## 2019-06-01 LAB — BPAM RBC
Blood Product Expiration Date: 202105282359
Blood Product Expiration Date: 202106142359
Blood Product Expiration Date: 202106142359
Blood Product Expiration Date: 202106142359
Blood Product Expiration Date: 202106142359
ISSUE DATE / TIME: 202105212050
ISSUE DATE / TIME: 202105220006
ISSUE DATE / TIME: 202105220246
ISSUE DATE / TIME: 202105220444
ISSUE DATE / TIME: 202105230605
Unit Type and Rh: 6200
Unit Type and Rh: 6200
Unit Type and Rh: 6200
Unit Type and Rh: 6200
Unit Type and Rh: 6200

## 2019-06-01 LAB — BASIC METABOLIC PANEL
Anion gap: 6 (ref 5–15)
BUN: 9 mg/dL (ref 8–23)
CO2: 22 mmol/L (ref 22–32)
Calcium: 8.2 mg/dL — ABNORMAL LOW (ref 8.9–10.3)
Chloride: 111 mmol/L (ref 98–111)
Creatinine, Ser: 0.92 mg/dL (ref 0.61–1.24)
GFR calc Af Amer: >60 mL/min (ref >60)
GFR calc non Af Amer: >60 mL/min (ref >60)
Glucose, Bld: 100 mg/dL — ABNORMAL HIGH (ref 70–99)
Potassium: 3.6 mmol/L (ref 3.5–5.1)
Sodium: 139 mmol/L (ref 135–145)

## 2019-06-01 LAB — TYPE AND SCREEN
ABO/RH(D): A POS
Antibody Screen: NEGATIVE
Unit division: 0
Unit division: 0
Unit division: 0
Unit division: 0
Unit division: 0

## 2019-06-01 LAB — CBC
HCT: 25.3 % — ABNORMAL LOW (ref 39.0–52.0)
Hemoglobin: 8.1 g/dL — ABNORMAL LOW (ref 13.0–17.0)
MCH: 26.7 pg (ref 26.0–34.0)
MCHC: 32 g/dL (ref 30.0–36.0)
MCV: 83.5 fL (ref 80.0–100.0)
Platelets: 173 10*3/uL (ref 150–400)
RBC: 3.03 MIL/uL — ABNORMAL LOW (ref 4.22–5.81)
RDW: 17.6 % — ABNORMAL HIGH (ref 11.5–15.5)
WBC: 5.4 10*3/uL (ref 4.0–10.5)
nRBC: 0 % (ref 0.0–0.2)

## 2019-06-01 LAB — GLUCOSE, CAPILLARY
Glucose-Capillary: 101 mg/dL — ABNORMAL HIGH (ref 70–99)
Glucose-Capillary: 107 mg/dL — ABNORMAL HIGH (ref 70–99)
Glucose-Capillary: 107 mg/dL — ABNORMAL HIGH (ref 70–99)
Glucose-Capillary: 124 mg/dL — ABNORMAL HIGH (ref 70–99)
Glucose-Capillary: 134 mg/dL — ABNORMAL HIGH (ref 70–99)
Glucose-Capillary: 98 mg/dL (ref 70–99)

## 2019-06-01 MED ORDER — INSULIN ASPART 100 UNIT/ML ~~LOC~~ SOLN
0.0000 [IU] | Freq: Three times a day (TID) | SUBCUTANEOUS | Status: DC
  Administered 2019-06-02: 2 [IU] via SUBCUTANEOUS

## 2019-06-01 MED ORDER — POTASSIUM CHLORIDE CRYS ER 20 MEQ PO TBCR
40.0000 meq | EXTENDED_RELEASE_TABLET | Freq: Once | ORAL | Status: AC
  Administered 2019-06-01: 40 meq via ORAL
  Filled 2019-06-01: qty 2

## 2019-06-01 MED ORDER — INSULIN ASPART 100 UNIT/ML ~~LOC~~ SOLN: 0.0000 [IU] | Freq: Every day | SUBCUTANEOUS | Status: DC

## 2019-06-01 MED ORDER — PANTOPRAZOLE SODIUM 40 MG PO TBEC
40.0000 mg | DELAYED_RELEASE_TABLET | Freq: Two times a day (BID) | ORAL | Status: DC
  Administered 2019-06-01 – 2019-06-02 (×2): 40 mg via ORAL
  Filled 2019-06-01 (×2): qty 1

## 2019-06-01 MED ORDER — ENSURE ENLIVE PO LIQD
237.0000 mL | Freq: Three times a day (TID) | ORAL | Status: DC
  Administered 2019-06-01 – 2019-06-02 (×2): 237 mL via ORAL

## 2019-06-01 NOTE — Progress Notes (Signed)
Nutrition Follow-up  DOCUMENTATION CODES:   Not applicable  INTERVENTION:  Provide Ensure Enlive po TID, each supplement provides 350 kcal and 20 grams of protein.  Encourage adequate PO intake.   NUTRITION DIAGNOSIS:   Inadequate oral intake related to acute illness, altered GI function as evidenced by NPO status; diet advanced; improved  GOAL:   Patient will meet greater than or equal to 90% of their needs; progressing  MONITOR:   PO intake, Supplement acceptance, Skin, Weight trends, I & O's, Labs  REASON FOR ASSESSMENT:   Malnutrition Screening Tool    ASSESSMENT:   64 yo male admitted with acute blood loss anemia with hgb of 2.6 with suspected slow UGIB. Pt recently discharged from [facility] approx 3 weeks ago after extensive workup for GI bleeding with no definitive source found. PMH includes alcoholic liver cirrhosis, grade 1 esophageal varices, hx of perforated peptic ulcer, GI bleed  Meal completion has been 100%. Per weight records, pt with a 9% weight loss over the past 1 month, which is significant for time frame. RD to order nutritional supplements to aid in increased caloric and protein needs.   Labs and medications reviewed.   Diet Order:   Diet Order            Diet regular Room service appropriate? Yes; Fluid consistency: Thin  Diet effective now              EDUCATION NEEDS:   Not appropriate for education at this time  Skin:  Skin Assessment: Reviewed RN Assessment  Last BM:  5/23  Height:   Ht Readings from Last 1 Encounters:  05/30/19 5\' 10"  (1.778 m)    Weight:   Wt Readings from Last 1 Encounters:  05/30/19 62.7 kg   BMI:  Body mass index is 19.83 kg/m.  Estimated Nutritional Needs:   Kcal:  1900-2200 kcals  Protein:  95-110 g  Fluid:  >/= 1.9 L   Corrin Parker, MS, RD, LDN RD pager number/after hours weekend pager number on Amion.

## 2019-06-01 NOTE — Progress Notes (Signed)
Subjective: No abdominal pain. No blood in stool.  Objective: Vital signs in last 24 hours: Temp:  [97.8 F (36.6 C)-98.1 F (36.7 C)] 97.8 F (36.6 C) (05/24 0743) Pulse Rate:  [75-82] 76 (05/24 0743) Resp:  [14-18] 15 (05/24 0743) BP: (106-119)/(61-73) 119/61 (05/24 0743) SpO2:  [94 %-98 %] 96 % (05/24 0743) Weight change:  Last BM Date: 05/31/19  PE: GEN:  NAD ABD:  Soft, midline supraumbilical 4cm hernia (reducible), non-tender  Lab Results: CBC    Component Value Date/Time   WBC 5.4 06/01/2019 0341   RBC 3.03 (L) 06/01/2019 0341   HGB 8.1 (L) 06/01/2019 0341   HCT 25.3 (L) 06/01/2019 0341   HCT 13.4 (LL) 03/14/2019 1750   PLT 173 06/01/2019 0341   MCV 83.5 06/01/2019 0341   MCH 26.7 06/01/2019 0341   MCHC 32.0 06/01/2019 0341   RDW 17.6 (H) 06/01/2019 0341   LYMPHSABS 1.1 05/29/2019 1918   MONOABS 0.7 05/29/2019 1918   EOSABS 0.2 05/29/2019 1918   BASOSABS 0.0 05/29/2019 1918   CMP     Component Value Date/Time   NA 139 06/01/2019 0341   K 3.6 06/01/2019 0341   CL 111 06/01/2019 0341   CO2 22 06/01/2019 0341   GLUCOSE 100 (H) 06/01/2019 0341   BUN 9 06/01/2019 0341   CREATININE 0.92 06/01/2019 0341   CALCIUM 8.2 (L) 06/01/2019 0341   PROT 6.0 (L) 05/29/2019 1724   ALBUMIN 2.7 (L) 05/29/2019 1724   AST 30 05/29/2019 1724   ALT 19 05/29/2019 1724   ALKPHOS 72 05/29/2019 1724   BILITOT 0.9 05/29/2019 1724   GFRNONAA >60 06/01/2019 0341   GFRAA >60 06/01/2019 0341   Assessment:  1.  Recurrent anemia. 2.  History melena and hemoccult positive stools.  No overt GI bleeding at present. 3.  Cirrhosis.  Plan:  1.  Patient has had extensive work-up and investigation of this problem, both here and at [facility].  Patient is not interested in repeating procedures unless absolutely necessary; at present patient has anemia only without overt bleeding (heme-positive stools fairly useless in assessment of GI bleeding, and are only principally designed for use in  colon cancer screening).  It may be he has persistent slow oozing from AVMs or from portal gastropathy; he had not been taking any iron supplementation so that may lead to more precipitous decline in Hgb as well. 2.  Agree with IV iron.  Follow CBC.  Transfuse as needed. 3.  Avoid any NSAIDs or blood thinners unless absolutely necessary. 4.  No plans for repeat endoscopic intervention here.  When/if patient has recurrent GI bleeding and/or worsening anemia, patient should go back to [facility] where double balloon enteroscopy is available and has been used for patient in recent past.  I see absolutely no indication to pursue yet more repeat endoscopies, capsule endoscopies, colonoscopies, or other GI testing at this point here at [facility]. 5.  Would advise patient follow up with [facility] upon hospital discharge. 6.  [facility] will sign-off; please call with questions; thank you for the consultation.   Landry Dyke 06/01/2019, 11:36 AM   Cell [phone] If no answer or after 5 PM call [phone]

## 2019-06-01 NOTE — Progress Notes (Signed)
PROGRESS NOTE    Gavin Arroyo  [id number] DOB: Dec 05, 1955 DOA: 05/29/2019 PCP: Patient, No Pcp Per    Chief Complaint  Patient presents with  . Weakness    Brief Narrative:  Patient is a 64 y.o. M PMH of ETOH abuse,remote perforated peptic ulcer, ventral hernia, cirrhosis and complicated hospital course for GIB including txfr to [facility] 04/2019. He initially had a brisk LGIB in 03/2019, Hospitalized for 30 days at [facility] with >30 units PRBC's transfused, had a EGD and colonoscopy showed small EV, several polyps.  Pt was discharged home, but returned quickly for rebleed. Capsule study showed diffuse bleeding to the Cecum, so was transferred to [facility].   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 4/13 video capsule showed likely bleeding distal TI. 4/14 CT GI bleed protocol and tagged RBC scan without evidence of active bleed. 4/19: Underwent retrograde BP with APC of angiectasia and colon and ileum. 4/21 underwent colonoscopy that showed two ulcerations, clipped. EGD showed grade 1 esophageal varices and portal hypertensive gastropathy. 4/22 underwent video capsule with no significant source of bleeding found. At this point if he were to bleed again- per GI recommend CT -GI bleed protocol and consulting IR for suspected SMA territory bleed based on prior capsule results for small intestinal bleeding."  Pt now admitted with worsening fatigue over the past week to the point he was having difficulty walking and had CP and dyspnea.  Denied fever, LE edema or abdominal pain.  In the ED, pt's Hgb was profoundly low at 2.6, borderline low BP that improved with fluids.  Stool was hemoccult + and 4 units PRBC's given.  Patient was admitted to [facility].  He was also initially started on octreotide, SBP prophylaxis with ceftriaxone which have been discontinued. Yesterday morning his hemoglobin was 6.5.  He was given 1 unit PRBC  ordered.   Assessment & Plan:   Active Problems:   Anemia   GIB (gastrointestinal bleeding)  Acute on chronic anemia: Likely secondary to GI bleed.  Patient has extensive history of upper and lower GI bleed and presented with anemia, hemoglobin 2.6.  Likely slow GI bleed.  GI has been consulted.  He has history of grade 1 esophageal varices.  He has had multiple procedures including upper and lower scope, capsule endoscopy at [facility].  Source unclear.  Suspect AVMs.  Iron studies showed low iron.  Currently denies having any blood in stool. -Hemoglobin yesterday morning 6.5.  1 unit PRBC given.  After that hemoglobin came up to 8.4.  This morning 8.1. -He also received iron infusion.  We will order for a.m. labs. -Consider hematology consultation.  History of liver cirrhosis: Likely secondary to alcohol.  Patient denies any current alcohol use and says he quit couple of months ago. -On Aldactone, furosemide.  Continue.  Home lactulose restarted.  DVT prophylaxis: SCDs given GI bleed Code Status: Full code Family Communication: No family at bedside  Disposition: Likely discharge home  Status is: Inpatient  Remains inpatient appropriate because:GI bleed with severe anemia   Dispo: The patient is from: Home              Anticipated d/c is to: Home              Anticipated d/c date is: Tomorrow if remains stable              Patient currently is not medically stable to  d/c.    Consultants:   Gastroenterology   Procedures:   None   Antimicrobials:  Anti-infectives (From admission, onward)   Start     Dose/Rate Route Frequency Ordered Stop   05/30/19 0130  cefTRIAXone (ROCEPHIN) 2 g in sodium chloride 0.9 % 100 mL IVPB  Status:  Discontinued     2 g 200 mL/hr over 30 Minutes Intravenous Daily at bedtime 05/30/19 0111 05/30/19 1429         Subjective: Yesterday morning 6.5.  At the time of admission it was 2.6.  This morning 8.1.  He denies having any major complaints at  this time.  Objective: Vitals:   06/01/19 0024 06/01/19 0354 06/01/19 0743 06/01/19 1244  BP: 115/63 107/69 119/61 (!) 105/55  Pulse: 75 76 76 77  Resp: 16 18 15 13   Temp: 98.1 F (36.7 C) 97.9 F (36.6 C) 97.8 F (36.6 C) 98.7 F (37.1 C)  TempSrc: Oral Oral Oral Oral  SpO2: 94% 95% 96% 97%  Weight:      Height:        Intake/Output Summary (Last 24 hours) at 06/01/2019 1526 Last data filed at 06/01/2019 1250 Gross per 24 hour  Intake 1017 ml  Output 875 ml  Net 142 ml   Filed Weights   05/30/19 0200  Weight: 62.7 kg    Examination:  General exam: Appears calm and comfortable  Respiratory system: Clear to auscultation. Respiratory effort normal. Cardiovascular system: S1 & S2 heard, RRR. No JVD, murmurs, rubs, gallops or clicks. No pedal edema. Gastrointestinal system: He has abdominal umbilical hernia, reducible, soft and nontender. Normal bowel sounds heard. Central nervous system: Alert and oriented. No focal neurological deficits. Extremities: Symmetric 5 x 5 power. Skin: No rashes, lesions or ulcers Psychiatry: Judgement and insight appear normal. Mood & affect appropriate.     Data Reviewed: I have personally reviewed following labs and imaging studies  CBC: Recent Labs  Lab 05/29/19 1724 05/29/19 1724 05/29/19 1918 05/29/19 1918 05/30/19 0944 05/30/19 2056 05/31/19 0344 05/31/19 1329 06/01/19 0341  WBC 6.6   < > 5.4  --  5.1 6.3 5.5  --  5.4  NEUTROABS 4.1  --  3.3  --   --   --   --   --   --   HGB 2.9*   < > 2.6*   < > 6.8* 7.1* 6.5* 8.4* 8.1*  HCT 10.6*   < > 10.0*   < > 21.4* 22.4* 21.0* 26.4* 25.3*  MCV 80.3   < > 83.3  --  81.7 82.7 83.3  --  83.5  PLT 200   < > 184  --  169 179 167  --  173   < > = values in this interval not displayed.    Basic Metabolic Panel: Recent Labs  Lab 05/29/19 1724 05/30/19 0944 05/31/19 0344 06/01/19 0341  NA 133* 137 137 139  K 4.2 4.1 3.7 3.6  CL 106 108 113* 111  CO2 16* 18* 19* 22  GLUCOSE 136*  103* 103* 100*  BUN 21 17 13 9   CREATININE 1.07 0.96 0.96 0.92  CALCIUM 8.3* 8.3* 7.8* 8.2*  MG  --  2.0  --   --   PHOS  --  4.8*  --   --     GFR: Estimated Creatinine Clearance: 74.8 mL/min (by C-G formula based on SCr of 0.92 mg/dL).  Liver Function Tests: Recent Labs  Lab 05/29/19 1724  AST 30  ALT  19  ALKPHOS 72  BILITOT 0.9  PROT 6.0*  ALBUMIN 2.7*    CBG: Recent Labs  Lab 05/31/19 2006 06/01/19 0023 06/01/19 0355 06/01/19 0821 06/01/19 1225  GLUCAP 102* 107* 98 124* 134*     Recent Results (from the past 240 hour(s))  SARS Coronavirus 2 by RT PCR (hospital order, performed in [facility] hospital lab) Nasopharyngeal Nasopharyngeal Swab     Status: None   Collection Time: 05/29/19  9:41 PM   Specimen: Nasopharyngeal Swab  Result Value Ref Range Status   SARS Coronavirus 2 NEGATIVE NEGATIVE Final    Comment: (NOTE) SARS-CoV-2 target nucleic acids are NOT DETECTED. The SARS-CoV-2 RNA is generally detectable in upper and lower respiratory specimens during the acute phase of infection. The lowest concentration of SARS-CoV-2 viral copies this assay can detect is 250 copies / mL. A negative result does not preclude SARS-CoV-2 infection and should not be used as the sole basis for treatment or other patient management decisions.  A negative result may occur with improper specimen collection / handling, submission of specimen other than nasopharyngeal swab, presence of viral mutation(s) within the areas targeted by this assay, and inadequate number of viral copies (<250 copies / mL). A negative result must be combined with clinical observations, patient history, and epidemiological information. Fact Sheet for Patients:   StrictlyIdeas.no Fact Sheet for Healthcare Providers: BankingDealers.co.za This test is not yet approved or cleared  by the [place] FDA and has been authorized for detection and/or diagnosis  of SARS-CoV-2 by FDA under an Emergency Use Authorization (EUA).  This EUA will remain in effect (meaning this test can be used) for the duration of the COVID-19 declaration under Section 564(b)(1) of the Act, 21 U.S.C. section 360bbb-3(b)(1), unless the authorization is terminated or revoked sooner. Performed at [facility], [place] [address], [place], [place] [address]   MRSA PCR Screening     Status: None   Collection Time: 05/30/19  1:58 AM   Specimen: Nasal Mucosa; Nasopharyngeal  Result Value Ref Range Status   MRSA by PCR NEGATIVE NEGATIVE Final    Comment:        The GeneXpert MRSA Assay (FDA approved for NASAL specimens only), is one component of a comprehensive MRSA colonization surveillance program. It is not intended to diagnose MRSA infection nor to guide or monitor treatment for MRSA infections. Performed at [facility], [place] [address] [place], [place] [address]      Radiology Studies: No results found.   Scheduled Meds: . sodium chloride   Intravenous Once  . sodium chloride   Intravenous Once  . ferrous sulfate  325 mg Oral TID WC  . furosemide  40 mg Oral Daily  . insulin aspart  0-6 Units Subcutaneous Q4H  . lactulose  30 g Oral TID  . pantoprazole  40 mg Oral BID  . spironolactone  100 mg Oral Daily   Continuous Infusions: . [START ON 06/03/2019] ferumoxytol       LOS: 2 days     Yaakov Guthrie, MD [facility]   To contact the attending provider between 7A-7P or the covering provider during after hours 7P-7A, please log into the web site [email or web address] and access using universal [employer] password for that web site. If you do not have the password, please call the hospital operator.  06/01/2019, 3:26 PM

## 2019-06-02 LAB — CBC
HCT: 25.6 % — ABNORMAL LOW (ref 39.0–52.0)
Hemoglobin: 8.1 g/dL — ABNORMAL LOW (ref 13.0–17.0)
MCH: 27 pg (ref 26.0–34.0)
MCHC: 31.6 g/dL (ref 30.0–36.0)
MCV: 85.3 fL (ref 80.0–100.0)
Platelets: 173 10*3/uL (ref 150–400)
RBC: 3 MIL/uL — ABNORMAL LOW (ref 4.22–5.81)
RDW: 18.6 % — ABNORMAL HIGH (ref 11.5–15.5)
WBC: 6.8 10*3/uL (ref 4.0–10.5)
nRBC: 0 % (ref 0.0–0.2)

## 2019-06-02 LAB — GLUCOSE, CAPILLARY
Glucose-Capillary: 179 mg/dL — ABNORMAL HIGH (ref 70–99)
Glucose-Capillary: 117 mg/dL — ABNORMAL HIGH (ref 70–99)

## 2019-06-02 LAB — BASIC METABOLIC PANEL
Anion gap: 9 (ref 5–15)
BUN: 8 mg/dL (ref 8–23)
CO2: 20 mmol/L — ABNORMAL LOW (ref 22–32)
Calcium: 8.7 mg/dL — ABNORMAL LOW (ref 8.9–10.3)
Chloride: 109 mmol/L (ref 98–111)
Creatinine, Ser: 0.76 mg/dL (ref 0.61–1.24)
GFR calc Af Amer: >60 mL/min (ref >60)
GFR calc non Af Amer: >60 mL/min (ref >60)
Glucose, Bld: 98 mg/dL (ref 70–99)
Potassium: 4 mmol/L (ref 3.5–5.1)
Sodium: 138 mmol/L (ref 135–145)

## 2019-06-02 MED ORDER — FERROUS SULFATE 325 (65 FE) MG PO TABS: 325.0000 mg | ORAL_TABLET | Freq: Every day | ORAL | 0 refills | Status: AC

## 2019-06-02 NOTE — Care Management (Addendum)
Pt deemed stable for discharge home today.  Pt confirms he doesn't have insurance nor a PCP.  CM mentioned appt arranged in March prior to discharge to [facility] however pt states he doesn't remember. Pt also informed CM that he not active with any home health agency - pt was set up with [facility] in March via charity.  Pt denied needing HH once discharged from this admit.  Pt informed CM that he had contacted [facility] clinic however he was told that he had to pay $50 each visit so he decided not to go to appt.  CM explained that he will need to provide financial information as requested for assistance with copays and medications at any clinic.    Discharge order signed. Pt states he lives with a roommate and denied barriers with [employer] parameters. Pt states he can get to [facility] for an appt.   Pt is in agreement with CM arranging another appt with [facility] clinic.  CM was unable to secure a follow up appt before July at [facility], other agencies are at lunch  - pt declined for CM to arrange the July appt.  Pt stated he will call clinic himself to arrange an appt - clinic information provided on AVS.  Pt educated on benefit of establishing a PCP at a local clinic including medication and copay assistance.

## 2019-06-02 NOTE — Progress Notes (Signed)
Patient was discharged home by MD order; discharged instructions  review and give to patient with care notes; IV DIC; skin intact; patient will be escorted to the car by nurse tech via wheelchair.  

## 2019-06-02 NOTE — Discharge Summary (Addendum)
Physician Discharge Summary  Gavin Arroyo DOB: 1955-01-21 DOA: 05/29/2019  PCP: Patient, No Pcp Per  Admit date: 05/29/2019 Discharge date: 06/02/2019  Admitted From: Home Disposition:  Home  Recommendations for Outpatient Follow-up:  1. Follow up with PCP in 1-2 weeks 2. Please obtain BMP/CBC in one week  Home Health: No Equipment/Devices: No  Discharge Condition: Stable CODE STATUS: Full code Diet recommendation: Regular  Brief/Interim Summary: Patient is a78 y.o.M PMH of ETOH abuse,remote perforated peptic ulcer, ventral hernia,cirrhosis and complicated hospital course for GIB including txfr to [facility] 04/2019. He initially had a brisk LGIB in 03/2019, Hospitalized for 30 days at [facility] with >30 units PRBC's transfused, had a EGD and colonoscopy showed small EV, several polyps. Pt was discharged home, but returned quickly for rebleed. Capsule study showed diffuse bleeding to the Cecum, so was transferred to [facility].   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 4/13 video capsule showed likely bleeding distal TI. 4/14 CT GI bleed protocol and tagged RBC scan without evidence of active bleed. 4/19: Underwent retrograde BP with APC of angiectasia and colon and ileum. 4/21 underwent colonoscopy that showed two ulcerations, clipped. EGD showed grade 1 esophageal varices and portal hypertensive gastropathy. 4/22 underwent video capsule with no significant source of bleeding found. At this point if he were to bleed again- per GI recommend CT -GI bleed protocol and consulting IR for suspected SMA territory bleed based on prior capsule results for small intestinal bleeding."  Pt now admitted with worsening fatigue over the past week to the point he was having difficulty walking and had CP and dyspnea. Denied fever, LE edema or abdominal pain. In the ED, pt's Hgb was profoundly low at 2.6, borderline low  BP that improved with fluids. Stool was hemoccult + and 4 units PRBC's given.  Patient was admitted to [facility].  He was also initially started on octreotide, SBP prophylaxis with ceftriaxone which have been discontinued. His hemoglobin was 6.5.  He was given 1 unit PRBC ordered.  His seems to have received a total of 5 units PRBCs.  Following is his hospital course the problem is fromat:  Acute on chronic anemia: Likely secondary to GI bleed.  Patient has extensive history of upper and lower GI bleed and presented with anemia, hemoglobin was 2.6 on admission.  Likely slow GI bleed.  GI has been consulted.  He has history of grade 1 esophageal varices.  He has had multiple procedures including upper and lower scope, capsule endoscopy at [facility].  Source unclear.  Suspect AVMs.  Iron studies showed low iron.  Currently denies having any blood in stool. -After PRBCs hemoglobin came up.  This morning it was 8.1. -He also received iron infusion.   -He has been seen by hematology.  Appreciate help.  Patient set up to follow with hematology outpatient for further iron infusions. -He is advised to avoid NSAID's.  Being discharged on FeSO4; one tablet daily per hematology recommendations.   History of liver cirrhosis: Likely secondary to alcohol.  Patient denies any current alcohol use and says he quit couple of months ago.  He has been noncompliant with his Aldactone Lasix and lactulose.  Patient advised to be compliant with his medications.  His cirrhosis is stable at this time.   Discharge Diagnoses:  Active Problems:   Anemia   GIB (gastrointestinal bleeding)  Discharge Instructions Discharge plan of care discussed with the  patient.  Answered his questions appropriately to the best of my knowledge.  He is being discharged home in a stable condition.  Allergies as of 06/02/2019   No Known Allergies     Medication List    STOP taking these medications   acetaminophen 325 MG tablet Commonly known  as: TYLENOL   acetaminophen 650 MG suppository Commonly known as: TYLENOL   chlorhexidine 0.12 % solution Commonly known as: PERIDEX   feeding supplement (PRO-STAT SUGAR FREE 64) Liqd   folic acid 1 MG tablet Commonly known as: FOLVITE   midodrine 10 MG tablet Commonly known as: PROAMATINE   mouth rinse Liqd solution   promethazine 12.5 MG tablet Commonly known as: PHENERGAN   sodium chloride 0.65 % Soln nasal spray Commonly known as: OCEAN     TAKE these medications   feeding supplement (ENSURE ENLIVE) Liqd Take 237 mLs by mouth 2 (two) times daily between meals.   ferrous sulfate 325 (65 FE) MG tablet Take 1 tablet (325 mg total) by mouth daily with breakfast.   furosemide 40 MG tablet Commonly known as: LASIX Take 1 tablet (40 mg total) by mouth daily. What changed: Another medication with the same name was removed. Continue taking this medication, and follow the directions you see here.   lactulose 10 GM/15ML solution Commonly known as: CHRONULAC Take 45 mLs (30 g total) by mouth 3 (three) times daily.   multivitamin with minerals Tabs tablet Take 1 tablet by mouth daily.   pantoprazole 40 MG tablet Commonly known as: PROTONIX Take 1 tablet (40 mg total) by mouth 2 (two) times daily. What changed: when to take this   polyvinyl alcohol 1.4 % ophthalmic solution Commonly known as: LIQUIFILM TEARS Place 1 drop into both eyes as needed for dry eyes.   senna-docusate 8.6-50 MG tablet Commonly known as: Senokot-S Take 1 tablet by mouth at bedtime as needed for mild constipation.   spironolactone 100 MG tablet Commonly known as: ALDACTONE Take 1 tablet (100 mg total) by mouth daily. What changed: Another medication with the same name was removed. Continue taking this medication, and follow the directions you see here.   sucralfate 1 GM/10ML suspension Commonly known as: CARAFATE Take 10 mLs (1 g total) by mouth 4 (four) times daily -  with meals and at  bedtime.   thiamine 100 MG tablet Take 1 tablet (100 mg total) by mouth daily.       No Known Allergies  Consultations:  Gastroenterology  Hematology   Procedures/Studies: DG Chest 2 View  Result Date: 05/29/2019 CLINICAL DATA:  Progressive weakness over 1 month, short of breath, anemia, cirrhosis EXAM: CHEST - 2 VIEW COMPARISON:  03/29/2019 FINDINGS: Frontal and lateral views of the chest demonstrate a stable enlarged cardiac silhouette. Mild central vascular congestion. No airspace disease, effusion, or pneumothorax. No acute bony abnormalities. Prior bilateral healed rib fractures. IMPRESSION: 1. Central vascular congestion without overt edema. Electronically Signed   By: Randa Ngo M.D.   On: 05/29/2019 18:56      Subjective: He denies having any complaints at this time.  Discharge Exam: Vitals:   06/02/19 0413 06/02/19 0800  BP: 117/63 (!) 122/57  Pulse: 79 83  Resp: 14 16  Temp: 98.4 F (36.9 C) 97.8 F (36.6 C)  SpO2: 97% 100%   Vitals:   06/01/19 2015 06/02/19 0029 06/02/19 0413 06/02/19 0800  BP: (!) 118/57 120/63 117/63 (!) 122/57  Pulse: 75 85 79 83  Resp: 16 17 14  16  Temp: 98 F (36.7 C) 98 F (36.7 C) 98.4 F (36.9 C) 97.8 F (36.6 C)  TempSrc: Oral Oral Oral Oral  SpO2: 98% 95% 97% 100%  Weight:   62.9 kg   Height:        General: Pt is alert, awake, not in acute distress Cardiovascular: RRR, S1/S2 +, no rubs, no gallops Respiratory: CTA bilaterally, no wheezing, no rhonchi Abdominal: He has abdominal hernia which is reducible at this time.  Soft, NT, ND, bowel sounds + Extremities: no edema, no cyanosis    The results of significant diagnostics from this hospitalization (including imaging, microbiology, ancillary and laboratory) are listed below for reference.     Microbiology: Recent Results (from the past 240 hour(s))  SARS Coronavirus 2 by RT PCR (hospital order, performed in [facility] hospital lab) Nasopharyngeal  Nasopharyngeal Swab     Status: None   Collection Time: 05/29/19  9:41 PM   Specimen: Nasopharyngeal Swab  Result Value Ref Range Status   SARS Coronavirus 2 NEGATIVE NEGATIVE Final    Comment: (NOTE) SARS-CoV-2 target nucleic acids are NOT DETECTED. The SARS-CoV-2 RNA is generally detectable in upper and lower respiratory specimens during the acute phase of infection. The lowest concentration of SARS-CoV-2 viral copies this assay can detect is 250 copies / mL. A negative result does not preclude SARS-CoV-2 infection and should not be used as the sole basis for treatment or other patient management decisions.  A negative result may occur with improper specimen collection / handling, submission of specimen other than nasopharyngeal swab, presence of viral mutation(s) within the areas targeted by this assay, and inadequate number of viral copies (<250 copies / mL). A negative result must be combined with clinical observations, patient history, and epidemiological information. Fact Sheet for Patients:   StrictlyIdeas.no Fact Sheet for Healthcare Providers: BankingDealers.co.za This test is not yet approved or cleared  by the [place] FDA and has been authorized for detection and/or diagnosis of SARS-CoV-2 by FDA under an Emergency Use Authorization (EUA).  This EUA will remain in effect (meaning this test can be used) for the duration of the COVID-19 declaration under Section 564(b)(1) of the Act, 21 U.S.C. section 360bbb-3(b)(1), unless the authorization is terminated or revoked sooner. Performed at [facility], [place] [address], [place], [place] [address]   MRSA PCR Screening     Status: None   Collection Time: 05/30/19  1:58 AM   Specimen: Nasal Mucosa; Nasopharyngeal  Result Value Ref Range Status   MRSA by PCR NEGATIVE NEGATIVE Final    Comment:        The GeneXpert MRSA Assay (FDA approved for NASAL specimens only), is  one component of a comprehensive MRSA colonization surveillance program. It is not intended to diagnose MRSA infection nor to guide or monitor treatment for MRSA infections. Performed at [facility], [place] [address], [place], [place] [address]      Labs: BNP (last 3 results) No results for input(s): BNP in the last 8760 hours. Basic Metabolic Panel: Recent Labs  Lab 05/29/19 1724 05/30/19 0944 05/31/19 0344 06/01/19 0341 06/02/19 0254  NA 133* 137 137 139 138  K 4.2 4.1 3.7 3.6 4.0  CL 106 108 113* 111 109  CO2 16* 18* 19* 22 20*  GLUCOSE 136* 103* 103* 100* 98  BUN 21 17 13 9 8   CREATININE 1.07 0.96 0.96 0.92 0.76  CALCIUM 8.3* 8.3* 7.8* 8.2* 8.7*  MG  --  2.0  --   --   --  PHOS  --  4.8*  --   --   --    Liver Function Tests: Recent Labs  Lab 05/29/19 1724  AST 30  ALT 19  ALKPHOS 72  BILITOT 0.9  PROT 6.0*  ALBUMIN 2.7*   No results for input(s): LIPASE, AMYLASE in the last 168 hours. No results for input(s): AMMONIA in the last 168 hours. CBC: Recent Labs  Lab 05/29/19 1724 05/29/19 1724 05/29/19 1918 05/29/19 1918 05/30/19 0944 05/30/19 0944 05/30/19 2056 05/31/19 0344 05/31/19 1329 06/01/19 0341 06/02/19 0254  WBC 6.6   < > 5.4   < > 5.1  --  6.3 5.5  --  5.4 6.8  NEUTROABS 4.1  --  3.3  --   --   --   --   --   --   --   --   HGB 2.9*   < > 2.6*   < > 6.8*   < > 7.1* 6.5* 8.4* 8.1* 8.1*  HCT 10.6*   < > 10.0*   < > 21.4*   < > 22.4* 21.0* 26.4* 25.3* 25.6*  MCV 80.3   < > 83.3   < > 81.7  --  82.7 83.3  --  83.5 85.3  PLT 200   < > 184   < > 169  --  179 167  --  173 173   < > = values in this interval not displayed.   Cardiac Enzymes: No results for input(s): CKTOTAL, CKMB, CKMBINDEX, TROPONINI in the last 168 hours. BNP: Invalid input(s): POCBNP CBG: Recent Labs  Lab 06/01/19 1225 06/01/19 1655 06/01/19 2119 06/02/19 0735 06/02/19 1136  GLUCAP 134* 101* 107* 179* 117*   D-Dimer No results for input(s): DDIMER in the  last 72 hours. Hgb A1c No results for input(s): HGBA1C in the last 72 hours. Lipid Profile No results for input(s): CHOL, HDL, LDLCALC, TRIG, CHOLHDL, LDLDIRECT in the last 72 hours. Thyroid function studies No results for input(s): TSH, T4TOTAL, T3FREE, THYROIDAB in the last 72 hours.  Invalid input(s): FREET3 Anemia work up Recent Labs    05/30/19 1607  VITAMINB12 561  FOLATE 29.2  FERRITIN 16*  TIBC 431  IRON 22*   Urinalysis    Component Value Date/Time   COLORURINE YELLOW 03/15/2019 0410   APPEARANCEUR CLEAR 03/15/2019 0410   LABSPEC 1.019 03/15/2019 0410   PHURINE 5.0 03/15/2019 0410   GLUCOSEU NEGATIVE 03/15/2019 0410   HGBUR NEGATIVE 03/15/2019 0410   BILIRUBINUR NEGATIVE 03/15/2019 0410   KETONESUR NEGATIVE 03/15/2019 0410   PROTEINUR NEGATIVE 03/15/2019 0410   NITRITE NEGATIVE 03/15/2019 0410   LEUKOCYTESUR NEGATIVE 03/15/2019 0410   Sepsis Labs Invalid input(s): PROCALCITONIN,  WBC,  LACTICIDVEN Microbiology Recent Results (from the past 240 hour(s))  SARS Coronavirus 2 by RT PCR (hospital order, performed in [place] hospital lab) Nasopharyngeal Nasopharyngeal Swab     Status: None   Collection Time: 05/29/19  9:41 PM   Specimen: Nasopharyngeal Swab  Result Value Ref Range Status   SARS Coronavirus 2 NEGATIVE NEGATIVE Final    Comment: (NOTE) SARS-CoV-2 target nucleic acids are NOT DETECTED. The SARS-CoV-2 RNA is generally detectable in upper and lower respiratory specimens during the acute phase of infection. The lowest concentration of SARS-CoV-2 viral copies this assay can detect is 250 copies / mL. A negative result does not preclude SARS-CoV-2 infection and should not be used as the sole basis for treatment or other patient management decisions.  A negative result may occur  with improper specimen collection / handling, submission of specimen other than nasopharyngeal swab, presence of viral mutation(s) within the areas targeted by this assay,  and inadequate number of viral copies (<250 copies / mL). A negative result must be combined with clinical observations, patient history, and epidemiological information. Fact Sheet for Patients:   StrictlyIdeas.no Fact Sheet for Healthcare Providers: BankingDealers.co.za This test is not yet approved or cleared  by the [place] FDA and has been authorized for detection and/or diagnosis of SARS-CoV-2 by FDA under an Emergency Use Authorization (EUA).  This EUA will remain in effect (meaning this test can be used) for the duration of the COVID-19 declaration under Section 564(b)(1) of the Act, 21 U.S.C. section 360bbb-3(b)(1), unless the authorization is terminated or revoked sooner. Performed at [facility], [place] [address] [place], [place] [address]   MRSA PCR Screening     Status: None   Collection Time: 05/30/19  1:58 AM   Specimen: Nasal Mucosa; Nasopharyngeal  Result Value Ref Range Status   MRSA by PCR NEGATIVE NEGATIVE Final    Comment:        The GeneXpert MRSA Assay (FDA approved for NASAL specimens only), is one component of a comprehensive MRSA colonization surveillance program. It is not intended to diagnose MRSA infection nor to guide or monitor treatment for MRSA infections. Performed at [facility], [place] [address], [place], [place] [address]      Time coordinating discharge: Over 30 minutes  SIGNED:   Yaakov Guthrie, MD  [facility] 06/02/2019, 12:14 PM Pager on [email or web address]  If 7PM-7AM, please contact night-coverage [email or web address] Password [email or web address]

## 2019-06-02 NOTE — Progress Notes (Signed)
[place]  [phone] [phone]) [phone]     ID: Gavin Arroyo DOB: 11/02/1955  Gavin#: [id number]  XKG#:[id number]  Patient Care Team: Patient, No Pcp Per as PCP - General (General Practice) Chauncey Cruel, MD OTHER MD:  CHIEF COMPLAINT: iron deficiency anemia secondary to occult GIB  CURRENT TREATMENT: iron supplementation   HISTORY OF CURRENT ILLNESS: Gavin Arroyo presented to the ED at [facility] on 05/29/2019 with a Hb of 2.29, MCV 80.3 (prior 98.2). Workup included a ferritin of 16, iron saturation 5%, normal B12 and folate. He was transfused and received one dose of feraheme 05/31/2019 which he tolerated well.  He has had an extensive GI workup here (colonoscopy and EGD 03/16/2019, repeat 03/30/2019, EGD 04/02/2019, colonoscopy 04/07/2019, EGD 04/14/2019, [place] and CT abdomen 03/14/2019, MRI of liver 03/17/2019, nuclear medicine bleeding scan 04/08/2019) and at [facility] (EGD 04/12/2019, MRI/US and CT abd 04/13/2019, enteroscopy 04/`12/2019, capsule endoscopy 04/21/2019, Occult GI bleed CT protocol 04/22/2019, nuclear medicine bleeding study 04/23/2019, repeat enteroscopy 04/27/2019, repeat GIB CT protocol 04/28/2019, repeat capsule endoscopy, EGD and colonoscopy 04/29/2019) w/o a definitive bleeding source identified. Dr Paulita Fujita speculates in his note from 06/01/2019 we may be dealing with AVMs or portal gastropathy. At this point no further studies are planned  The patient's subsequent history is as detailed below.  INTERVAL HISTORY: I met with the patient in his hospital room 06/01/2019; no family present  REVIEW OF SYSTEMS: He feels well, denies nausea, vomiting or bowel changes; normal appetite, no significant weight loss. Tells me he has tolerated iron by moth "OK" in past though has not taken itconsistently.  PAST MEDICAL HISTORY: Past Medical History:  Diagnosis Date  . GI bleeding 03/2019    PAST SURGICAL HISTORY: Past Surgical History:  Procedure  Laterality Date  . COLONOSCOPY WITH PROPOFOL N/A 03/16/2019   Procedure: COLONOSCOPY WITH PROPOFOL;  Surgeon: Ronnette Juniper, MD;  Location: [facility];  Service: Gastroenterology;  Laterality: N/A;  . COLONOSCOPY WITH PROPOFOL N/A 03/20/2019   Procedure: COLONOSCOPY WITH PROPOFOL;  Surgeon: Ronnette Juniper, MD;  Location: [facility];  Service: Gastroenterology;  Laterality: N/A;  . COLONOSCOPY WITH PROPOFOL N/A 04/07/2019   Procedure: COLONOSCOPY WITH PROPOFOL;  Surgeon: Clarene Essex, MD;  Location: [facility];  Service: Endoscopy;  Laterality: N/A;  . ESOPHAGOGASTRODUODENOSCOPY (EGD) WITH PROPOFOL N/A 03/16/2019   Procedure: ESOPHAGOGASTRODUODENOSCOPY (EGD) WITH PROPOFOL;  Surgeon: Ronnette Juniper, MD;  Location: [facility];  Service: Gastroenterology;  Laterality: N/A;  . ESOPHAGOGASTRODUODENOSCOPY (EGD) WITH PROPOFOL N/A 04/02/2019   Procedure: ESOPHAGOGASTRODUODENOSCOPY (EGD) WITH PROPOFOL;  Surgeon: Wilford Corner, MD;  Location: [facility];  Service: Endoscopy;  Laterality: N/A;  . ESOPHAGOGASTRODUODENOSCOPY (EGD) WITH PROPOFOL N/A 04/12/2019   Procedure: ESOPHAGOGASTRODUODENOSCOPY (EGD) WITH PROPOFOL;  Surgeon: Clarene Essex, MD;  Location: [facility];  Service: Endoscopy;  Laterality: N/A;  And possible capsule endoscopy as well  . GIVENS CAPSULE STUDY N/A 04/12/2019   Procedure: GIVENS CAPSULE STUDY;  Surgeon: Clarene Essex, MD;  Location: [facility];  Service: Endoscopy;  Laterality: N/A;  . HEMOSTASIS CLIP PLACEMENT  03/20/2019   Procedure: HEMOSTASIS CLIP PLACEMENT;  Surgeon: Ronnette Juniper, MD;  Location[facility] END[facility];  Service: Gastroenterology;;  . HEMOSTASIS CONTROL  03/20/2019   Procedure: HEMOSTASIS CONTROL;  Surgeon: Ronnette Juniper, MD;  Location: [facility] ENDOSCOPY;  Service: Gastroenterology;;  . POLYPECTOMY  03/16/2019   Procedure: POLYPECTOMY;  Surgeon: Ronnette Juniper, MD;  Location: [facility] ENDOSCOPY;  Service: Gastroenterology;;  . POLYPECTOMY  03/20/2019   Procedure: POLYPECTOMY;  Surgeon: Ronnette Juniper,  MD;  Location: [place] ENDOSCOPY;  Service: Gastroenterology;;  . REPAIR OF PERFORATED ULCER  2010  ?    FAMILY HISTORY The patient's father died at age 27 from mesothelioma as did both his brothers.  They were all tile setters.  The patient's mother died from "old age" at age 58.  The patient has 1 brother, no sisters.  His brother had a stroke but no history of cancer.  There is no history of bleeding problems in the family to the patient's knowledge   SOCIAL HISTORY:  Gavin. 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  He has not worked in about 10 years.  He describes himself as single.  His children are Gavin Arroyo, a daughter who lives in [place] and works for the credit union there; a son, Gavin Arroyo, who lives in [place] and works as a Freight forwarder of an Designer, industrial/product; and Gavin Arroyo, who lives in [place].  The patient has 3 grandchildren.  He is not a Ambulance person.    ADVANCED DIRECTIVES: The patient does not have advanced directives but tells me he would plan to name Myriam Jacobson as his healthcare power of attorney.  She can be reached at [phone].   HEALTH MAINTENANCE: Social History   Tobacco Use  . Smoking status: Current Some Day Smoker    Packs/day: 0.25    Types: Cigarettes  . Smokeless tobacco: Never Used  Substance Use Topics  . Alcohol use: Not Currently    Alcohol/week: 1.0 standard drinks    Types: 1 Shots of liquor per week    Comment: liquor every day , one gallon a day   . Drug use: Yes    Types: Cocaine    Comment: every day       No Known Allergies  Current Facility-Administered Medications  Medication Dose Route Frequency Provider Last Rate Last Admin  . 0.9 %  sodium chloride infusion (Manually program via Guardrails IV Fluids)   Intravenous Once Anders Simmonds, MD      . 0.9 %  sodium chloride infusion (Manually program via Guardrails IV Fluids)   Intravenous Once Anders Simmonds, MD      . acetaminophen (TYLENOL) tablet 650 mg  650  mg Oral Q6H PRN Chauncey Mann, MD   650 mg at 05/31/19 2338  . docusate sodium (COLACE) capsule 100 mg  100 mg Oral BID PRN Collene Gobble, MD      . feeding supplement (ENSURE ENLIVE) (ENSURE ENLIVE) liquid 237 mL  237 mL Oral TID BM Matcha, Anupama, MD   237 mL at 06/01/19 1956  . ferrous sulfate tablet 325 mg  325 mg Oral TID WC Matcha, Anupama, MD   325 mg at 06/01/19 1617  . [START ON 06/03/2019] ferumoxytol (FERAHEME) 510 mg in sodium chloride 0.9 % 100 mL IVPB  510 mg Intravenous Once Matcha, Anupama, MD      . furosemide (LASIX) tablet 40 mg  40 mg Oral Daily Collene Gobble, MD   40 mg at 06/01/19 0906  . insulin aspart (novoLOG) injection 0-5 Units  0-5 Units Subcutaneous QHS Byrum, Rose Fillers, MD      . insulin aspart (novoLOG) injection 0-9 Units  0-9 Units Subcutaneous TID WC Byrum, Rose Fillers, MD      . lactulose (CHRONULAC) 10 GM/15ML solution 30 g  30 g Oral TID Collene Gobble, MD   30 g at 06/01/19 2215  . pantoprazole (PROTONIX) EC tablet 40  mg  40 mg Oral BID Yaakov Guthrie, MD   40 mg at 06/01/19 2215  . polyethylene glycol (MIRALAX / GLYCOLAX) packet 17 g  17 g Oral Daily PRN Collene Gobble, MD      . spironolactone (ALDACTONE) tablet 100 mg  100 mg Oral Daily Collene Gobble, MD   100 mg at 06/01/19 2122    OBJECTIVE: White man examined in bed  Vitals:   06/02/19 0029 06/02/19 0413  BP: 120/63 117/63  Pulse: 85 79  Resp: 17 14  Temp: 98 F (36.7 C) 98.4 F (36.9 C)  SpO2: 95% 97%     Body mass index is 19.9 kg/m.   Wt Readings from Last 3 Encounters:  06/02/19 138 lb 10.7 oz (62.9 kg)  04/16/19 152 lb 1.9 oz (69 kg)  03/20/19 158 lb 4.6 oz (71.8 kg)      ECOG FS:1 - Symptomatic but completely ambulatory  Lungs no rales or rhonchi Heart regular rate and rhythm Abd soft, nontender, large ventral hernia MSK no focal spinal tenderness, no joint edema Neuro: non-focal, well-oriented, appropriate affect   LAB RESULTS:  CMP     Component Value Date/Time    NA 138 06/02/2019 0254   K 4.0 06/02/2019 0254   CL 109 06/02/2019 0254   CO2 20 (L) 06/02/2019 0254   GLUCOSE 98 06/02/2019 0254   BUN 8 06/02/2019 0254   CREATININE 0.76 06/02/2019 0254   CALCIUM 8.7 (L) 06/02/2019 0254   PROT 6.0 (L) 05/29/2019 1724   ALBUMIN 2.7 (L) 05/29/2019 1724   AST 30 05/29/2019 1724   ALT 19 05/29/2019 1724   ALKPHOS 72 05/29/2019 1724   BILITOT 0.9 05/29/2019 1724   GFRNONAA >60 06/02/2019 0254   GFRAA >60 06/02/2019 0254    No results found for: TOTALPROTELP, ALBUMINELP, A1GS, A2GS, BETS, BETA2SER, GAMS, MSPIKE, SPEI  No results found for: KPAFRELGTCHN, LAMBDASER, KAPLAMBRATIO  Lab Results  Component Value Date   WBC 6.8 06/02/2019   NEUTROABS 3.3 05/29/2019   HGB 8.1 (L) 06/02/2019   HCT 25.6 (L) 06/02/2019   MCV 85.3 06/02/2019   PLT 173 06/02/2019    @LASTCHEMISTRY @  No results found for: LABCA2  No components found for: QMGNOI370  Recent Labs  Lab 05/29/19 2050  INR 1.4*    No results found for: LABCA2  No results found for: WUG891  No results found for: QXI503  No results found for: UUE280  No results found for: CA2729  No components found for: HGQUANT  No results found for: CEA1 / No results found for: CEA1   Lab Results  Component Value Date   AFPTUMOR 2.6 03/17/2019    No results found for: CHROMOGRNA  No results found for: PSA1  No results displayed because visit has over 200 results.      (this displays the last labs from the last 3 days)  No results found for: TOTALPROTELP, ALBUMINELP, A1GS, A2GS, BETS, BETA2SER, GAMS, MSPIKE, SPEI (this displays SPEP labs)  No results found for: KPAFRELGTCHN, LAMBDASER, KAPLAMBRATIO (kappa/lambda light chains)  No results found for: HGBA, HGBA2QUANT, HGBFQUANT, HGBSQUAN (Hemoglobinopathy evaluation)   Lab Results  Component Value Date   LDH 135 03/17/2019    Lab Results  Component Value Date   IRON 22 (L) 05/30/2019   TIBC 431 05/30/2019   IRONPCTSAT  5 (L) 05/30/2019   (Iron and TIBC)  Lab Results  Component Value Date   FERRITIN 16 (L) 05/30/2019    Urinalysis  Component Value Date/Time   COLORURINE YELLOW 03/15/2019 0410   APPEARANCEUR CLEAR 03/15/2019 0410   LABSPEC 1.019 03/15/2019 0410   PHURINE 5.0 03/15/2019 0410   GLUCOSEU NEGATIVE 03/15/2019 0410   HGBUR NEGATIVE 03/15/2019 0410   BILIRUBINUR NEGATIVE 03/15/2019 0410   KETONESUR NEGATIVE 03/15/2019 0410   PROTEINUR NEGATIVE 03/15/2019 0410   NITRITE NEGATIVE 03/15/2019 0410   LEUKOCYTESUR NEGATIVE 03/15/2019 0410     STUDIES: DG Chest 2 View  Result Date: 05/29/2019 CLINICAL DATA:  Progressive weakness over 1 month, short of breath, anemia, cirrhosis EXAM: CHEST - 2 VIEW COMPARISON:  03/29/2019 FINDINGS: Frontal and lateral views of the chest demonstrate a stable enlarged cardiac silhouette. Mild central vascular congestion. No airspace disease, effusion, or pneumothorax. No acute bony abnormalities. Prior bilateral healed rib fractures. IMPRESSION: 1. Central vascular congestion without overt edema. Electronically Signed   By: Randa Ngo M.D.   On: 05/29/2019 18:56    ELIGIBLE FOR AVAILABLE RESEARCH PROTOCOL: no  ASSESSMENT: 64 y.o. 36, [place] man with GIB extensively worked up here and at [facility] without a definitive bleeding source identified, with resulting iron deficiency anemia, s/p feraheme x1 on 05/31/2019  PLAN: I reviewed Gavin Minami's situation in detail with him. He understands while we have not found the precise source of his bleeding, we have also NOT identified cancer as the problem-- that is very favorable.  He is likely to continue with the same problem and therefore to become intermittently iron deficient. He tolerated the first feraheme dose w/o event. We have scheduled him for a visit at the [facility] and a second dose on 06/10/2019-- he has directions and the time and date on hand.  If he continues on oral supplementation (one table  ferrous sulfate daily is usually better tolerated than larger doses and therefore assists compliance) he will need fewer iron infusions in future.  Our plan is to check his iron studies every 2-3 months indefinitely and supplement as needed. Further GI evaluation if any will be as per his GI consultants.  Jobanny has a good understanding of the overall plan. He agrees with it. The basic information was given to him I writing.  Please let me know if we can be of further help  Chauncey Cruel, MD   06/02/2019 7:56 AM Medical Oncology and Hematology [facility] [address] [place], [place] [address] Tel. [phone]    Fax. [phone]

## 2019-06-06 DIAGNOSIS — D509 Iron deficiency anemia, unspecified: Secondary | ICD-10-CM | POA: Insufficient documentation

## 2019-06-06 NOTE — Progress Notes (Deleted)
[place]  [phone] [phone]     ID: [name] DOB: [birth year]  MR#: [id number]  FXT#:[id number]  Patient Care Team: Patient, No Pcp Per as PCP - General ([place]) Scot Dock, NP OTHER MD:  CHIEF COMPLAINT: iron deficiency anemia secondary to occult GIB  CURRENT TREATMENT: iron supplementation   HISTORY OF CURRENT ILLNESS: [name] presented to the ED at [facility] on 05/29/2019 with a Hb of 2.29, MCV 80.3 (prior 98.2). Workup included a ferritin of 16, iron saturation 5%, normal B12 and folate. He was transfused and received one dose of feraheme 05/31/2019 which he tolerated well.  He has had an extensive GI workup here (colonoscopy and EGD 03/16/2019, repeat 03/30/2019, EGD 04/02/2019, colonoscopy 04/07/2019, EGD 04/14/2019, [place] and CT abdomen 03/14/2019, MRI of liver 03/17/2019, nuclear medicine bleeding scan 04/08/2019) and at [facility] (EGD 04/12/2019, MRI/US and CT abd 04/13/2019, enteroscopy 04/`12/2019, capsule endoscopy 04/21/2019, Occult GI bleed CT protocol 04/22/2019, nuclear medicine bleeding study 04/23/2019, repeat enteroscopy 04/27/2019, repeat GIB CT protocol 04/28/2019, repeat capsule endoscopy, EGD and colonoscopy 04/29/2019) w/o a definitive bleeding source identified. Dr Paulita Fujita speculates in his note from 05/62/2021 we may be dealing with AVMs or portal gastropathy. At this point no further studies are planned  The patient's subsequent history is as detailed below.  INTERVAL HISTORY: I met with the patient in his hospital room 05/62/2021; no family present  REVIEW OF SYSTEMS: He feels well, denies nausea, vomiting or bowel changes; normal appetite, no significant weight loss. Tells me he has tolerated iron by moth "OK" in past though has not taken itconsistently.  PAST MEDICAL HISTORY: Past Medical History:  Diagnosis Date  . GI bleeding 03/2019    PAST SURGICAL HISTORY: Past Surgical History:  Procedure  Laterality Date  . COLONOSCOPY WITH PROPOFOL N/A 03/16/2019   Procedure: COLONOSCOPY WITH PROPOFOL;  Surgeon: Ronnette Juniper, MD;  Location: [place];  Service: Gastroenterology;  Laterality: N/A;  . COLONOSCOPY WITH PROPOFOL N/A 03/20/2019   Procedure: COLONOSCOPY WITH PROPOFOL;  Surgeon: Ronnette Juniper, MD;  Location: [place];  Service: Gastroenterology;  Laterality: N/A;  . COLONOSCOPY WITH PROPOFOL N/A 04/07/2019   Procedure: COLONOSCOPY WITH PROPOFOL;  Surgeon: Clarene Essex, MD;  Location: [place];  Service: Endoscopy;  Laterality: N/A;  . ESOPHAGOGASTRODUODENOSCOPY (EGD) WITH PROPOFOL N/A 03/16/2019   Procedure: ESOPHAGOGASTRODUODENOSCOPY (EGD) WITH PROPOFOL;  Surgeon: Ronnette Juniper, MD;  Location: [place];  Service: Gastroenterology;  Laterality: N/A;  . ESOPHAGOGASTRODUODENOSCOPY (EGD) WITH PROPOFOL N/A 04/02/2019   Procedure: ESOPHAGOGASTRODUODENOSCOPY (EGD) WITH PROPOFOL;  Surgeon: Wilford Corner, MD;  Location: [place];  Service: Endoscopy;  Laterality: N/A;  . ESOPHAGOGASTRODUODENOSCOPY (EGD) WITH PROPOFOL N/A 04/12/2019   Procedure: ESOPHAGOGASTRODUODENOSCOPY (EGD) WITH PROPOFOL;  Surgeon: Clarene Essex, MD;  Location: [place];  Service: Endoscopy;  Laterality: N/A;  And possible capsule endoscopy as well  . GIVENS CAPSULE STUDY N/A 04/12/2019   Procedure: GIVENS CAPSULE STUDY;  Surgeon: Clarene Essex, MD;  Location: [place];  Service: Endoscopy;  Laterality: N/A;  . HEMOSTASIS CLIP PLACEMENT  03/20/2019   Procedure: HEMOSTASIS CLIP PLACEMENT;  Surgeon: Ronnette Juniper, MD;  Location: [facility] ENDOSCOPY;  Service: Gastroenterology;;  . HEMOSTASIS CONTROL  03/20/2019   Procedure: HEMOSTASIS CONTROL;  Surgeon: Ronnette Juniper, MD;  Location: [facility] ENDOSCOPY;  Service: Gastroenterology;;  . POLYPECTOMY  03/16/2019   Procedure: POLYPECTOMY;  Surgeon: Ronnette Juniper, MD;  Location: [facility] ENDOSCOPY;  Service: Gastroenterology;;  . POLYPECTOMY  03/20/2019   Procedure: POLYPECTOMY;  Surgeon: Ronnette Juniper,  MD;  Location: [place] ENDOSCOPY;  Service: Gastroenterology;;  . REPAIR OF PERFORATED ULCER  2010  ?    FAMILY HISTORY The patient's father died at age 75 from mesothelioma as did both his brothers.  They were all tile setters.  The patient's mother died from "old age" at age 43.  The patient has 1 brother, no sisters.  His brother had a stroke but no history of cancer.  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  He has not worked in about 10 years.  He describes himself as single.  His children are Leonard Downing, a daughter who lives in [place] and works for the credit union there; a son, Thurmond Butts, who lives in [place] and works as a Freight forwarder of an Designer, industrial/product; and [place], who lives in [place].  The patient has 3 grandchildren.  He is not a Ambulance person.    ADVANCED DIRECTIVES: The patient does not have advanced directives but tells me he would plan to name Myriam Jacobson as his healthcare power of attorney.  She can be reached at [phone].   HEALTH MAINTENANCE: Social History   Tobacco Use  . Smoking status: Current Some Day Smoker    Packs/day: 0.25    Types: Cigarettes  . Smokeless tobacco: Never Used  Substance Use Topics  . Alcohol use: Not Currently    Alcohol/week: 1.0 standard drinks    Types: 1 Shots of liquor per week    Comment: liquor every day , one gallon a day   . Drug use: Yes    Types: Cocaine    Comment: every day       No Known Allergies  Current Outpatient Medications  Medication Sig Dispense Refill  . feeding supplement, ENSURE ENLIVE, (ENSURE ENLIVE) LIQD Take 237 mLs by mouth 2 (two) times daily between meals. 237 mL 12  . ferrous sulfate 325 (65 FE) MG tablet Take 1 tablet (325 mg total) by mouth daily with breakfast. 30 tablet 0  . furosemide (LASIX) 40 MG tablet Take 1 tablet (40 mg total) by mouth daily. (Patient not  taking: Reported on 05/30/2019) 30 tablet 3  . lactulose (CHRONULAC) 10 GM/15ML solution Take 45 mLs (30 g total) by mouth 3 (three) times daily. 236 mL 0  . Multiple Vitamin (MULTIVITAMIN WITH MINERALS) TABS tablet Take 1 tablet by mouth daily.    . pantoprazole (PROTONIX) 40 MG tablet Take 1 tablet (40 mg total) by mouth 2 (two) times daily. (Patient taking differently: Take 40 mg by mouth 2 (two) times daily before a meal. )    . polyvinyl alcohol (LIQUIFILM TEARS) 1.4 % ophthalmic solution Place 1 drop into both eyes as needed for dry eyes. 15 mL 0  . senna-docusate (SENOKOT-S) 8.6-50 MG tablet Take 1 tablet by mouth at bedtime as needed for mild constipation. (Patient not taking: Reported on 05/30/2019)    . spironolactone (ALDACTONE) 100 MG tablet Take 1 tablet (100 mg total) by mouth daily. (Patient not taking: Reported on 05/30/2019) 30 tablet 3  . sucralfate (CARAFATE) 1 GM/10ML suspension Take 10 mLs (1 g total) by mouth 4 (four) times daily -  with meals and at bedtime. (Patient not taking: Reported on 05/30/2019) 420 mL 0  . thiamine 100 MG tablet Take 1 tablet (100 mg total) by mouth daily.     No current facility-administered  medications for this visit.    OBJECTIVE: White man examined in bed  There were no vitals filed for this visit.   There is no height or weight on file to calculate BMI.   Wt Readings from Last 3 Encounters:  06/02/19 138 lb 10.7 oz (62.9 kg)  04/16/19 152 lb 1.9 oz (69 kg)  03/20/19 158 lb 4.6 oz (71.8 kg)      ECOG FS:1 - Symptomatic but completely ambulatory  Lungs no rales or rhonchi Heart regular rate and rhythm Abd soft, nontender, large ventral hernia MSK no focal spinal tenderness, no joint edema Neuro: non-focal, well-oriented, appropriate affect   LAB RESULTS:  CMP     Component Value Date/Time   NA 138 06/02/2019 0254   K 4.0 06/02/2019 0254   CL 109 06/02/2019 0254   CO2 20 (L) 06/02/2019 0254   GLUCOSE 98 06/02/2019 0254   BUN 8  06/02/2019 0254   CREATININE 0.76 06/02/2019 0254   CALCIUM 8.7 (L) 06/02/2019 0254   PROT 6.0 (L) 05/29/2019 1724   ALBUMIN 2.7 (L) 05/29/2019 1724   AST 30 05/29/2019 1724   ALT 19 05/29/2019 1724   ALKPHOS 72 05/29/2019 1724   BILITOT 0.9 05/29/2019 1724   GFRNONAA >60 06/02/2019 0254   GFRAA >60 06/02/2019 0254    No results found for: TOTALPROTELP, ALBUMINELP, A1GS, A2GS, BETS, BETA2SER, GAMS, MSPIKE, SPEI  No results found for: KPAFRELGTCHN, LAMBDASER, KAPLAMBRATIO  Lab Results  Component Value Date   WBC 6.8 06/02/2019   NEUTROABS 3.3 05/29/2019   HGB 8.1 (L) 06/02/2019   HCT 25.6 (L) 06/02/2019   MCV 85.3 06/02/2019   PLT 173 06/02/2019    @LASTCHEMISTRY @  No results found for: LABCA2  No components found for: [id number]  No results for input(s): INR in the last 168 hours.  No results found for: LABCA2  No results found for: ZTI458  No results found for: KDX833  No results found for: ASN053  No results found for: CA2729  No components found for: HGQUANT  No results found for: CEA1 / No results found for: CEA1   Lab Results  Component Value Date   AFPTUMOR 2.6 03/17/2019    No results found for: CHROMOGRNA  No results found for: PSA1  No visits with results within 3 Day(s) from this visit.  Latest known visit with results is:  No results displayed because visit has over 200 results.      (this displays the last labs from the last 3 days)  No results found for: TOTALPROTELP, ALBUMINELP, A1GS, A2GS, BETS, BETA2SER, GAMS, MSPIKE, SPEI (this displays SPEP labs)  No results found for: KPAFRELGTCHN, LAMBDASER, KAPLAMBRATIO (kappa/lambda light chains)  No results found for: HGBA, HGBA2QUANT, HGBFQUANT, HGBSQUAN (Hemoglobinopathy evaluation)   Lab Results  Component Value Date   LDH 135 03/17/2019    Lab Results  Component Value Date   IRON 22 (L) 05/30/2019   TIBC 431 05/30/2019   IRONPCTSAT 5 (L) 05/30/2019   (Iron and TIBC)  Lab  Results  Component Value Date   FERRITIN 16 (L) 05/30/2019    Urinalysis    Component Value Date/Time   COLORURINE YELLOW 03/15/2019 0410   APPEARANCEUR CLEAR 03/15/2019 0410   LABSPEC 1.019 03/15/2019 0410   PHURINE 5.0 03/15/2019 0410   GLUCOSEU NEGATIVE 03/15/2019 0410   HGBUR NEGATIVE 03/15/2019 0410   BILIRUBINUR NEGATIVE 03/15/2019 0410   KETONESUR NEGATIVE 03/15/2019 0410   PROTEINUR NEGATIVE 03/15/2019 0410   NITRITE NEGATIVE 03/15/2019 0410  LEUKOCYTESUR NEGATIVE 03/15/2019 0410     STUDIES: DG Chest 2 View  Result Date: 05/29/2019 CLINICAL DATA:  Progressive weakness over 1 month, short of breath, anemia, cirrhosis EXAM: CHEST - 2 VIEW COMPARISON:  03/29/2019 FINDINGS: Frontal and lateral views of the chest demonstrate a stable enlarged cardiac silhouette. Mild central vascular congestion. No airspace disease, effusion, or pneumothorax. No acute bony abnormalities. Prior bilateral healed rib fractures. IMPRESSION: 1. Central vascular congestion without overt edema. Electronically Signed   By: Randa Ngo M.D.   On: 05/29/2019 18:56    ELIGIBLE FOR AVAILABLE RESEARCH PROTOCOL: no  ASSESSMENT: 64 y.o. 22, [place] man with GIB extensively worked up here and at [facility] without a definitive bleeding source identified, with resulting iron deficiency anemia, s/p feraheme x1 on 05/31/2019  PLAN: I reviewed Mr Tullius's situation in detail with him. He understands while we have not found the precise source of his bleeding, we have also NOT identified cancer as the problem-- that is very favorable.  He is likely to continue with the same problem and therefore to become intermittently iron deficient. He tolerated the first feraheme dose w/o event. We have scheduled him for a visit at the [facility] and a second dose on 06/10/2019-- he has directions and the time and date on hand.  If he continues on oral supplementation (one table ferrous sulfate daily is usually better  tolerated than larger doses and therefore assists compliance) he will need fewer iron infusions in future.  Our plan is to check his iron studies every 2-3 months indefinitely and supplement as needed. Further GI evaluation if any will be as per his GI consultants.  Jer[place] has a good understanding of the overall plan. He agrees with it. The basic information was given to him I writing.   Total encounter time: *** minutes  Wilber Bihari, NP 06/06/19 3:27 PM Medical Oncology and Hematology [facility] [place], [place] [address] Tel. [phone]    Fax. [phone]  *Total Encounter Time as defined by the Centers for Medicare and Medicaid Services includes, in addition to the face-to-face time of a patient visit (documented in the note above) non-face-to-face time: obtaining and reviewing outside history, ordering and reviewing medications, tests or procedures, care coordination (communications with other health care professionals or caregivers) and documentation in the medical record.

## 2019-06-09 ENCOUNTER — Other Ambulatory Visit: Payer: Self-pay | Admitting: Oncology

## 2019-06-09 ENCOUNTER — Telehealth: Payer: Self-pay | Admitting: Adult Health

## 2019-06-09 NOTE — Telephone Encounter (Signed)
Mr. Eastman has been cld and rescheduled to see [name] on 6/23 at 9am w/labs at 61am. Pt aware that a scheduler will call to reschedule his infusion appt.

## 2019-06-10 ENCOUNTER — Inpatient Hospital Stay: Payer: Self-pay

## 2019-06-10 ENCOUNTER — Inpatient Hospital Stay (HOSPITAL_BASED_OUTPATIENT_CLINIC_OR_DEPARTMENT_OTHER): Payer: Self-pay | Admitting: Adult Health

## 2019-06-10 DIAGNOSIS — D500 Iron deficiency anemia secondary to blood loss (chronic): Secondary | ICD-10-CM

## 2019-06-11 NOTE — Progress Notes (Signed)
error 

## 2019-06-17 ENCOUNTER — Ambulatory Visit: Payer: Self-pay

## 2019-07-01 ENCOUNTER — Other Ambulatory Visit: Payer: Self-pay

## 2019-07-01 ENCOUNTER — Inpatient Hospital Stay: Payer: Self-pay | Admitting: Adult Health

## 2019-07-01 ENCOUNTER — Inpatient Hospital Stay: Payer: Self-pay

## 2019-07-01 ENCOUNTER — Telehealth: Payer: Self-pay | Admitting: Adult Health

## 2019-07-01 NOTE — Telephone Encounter (Signed)
Gavin Arroyo has been rescheduled to see [name] on 7/15 at 9am w/labs at 830am.

## 2019-07-01 NOTE — Progress Notes (Deleted)
[name]  [phone] [phone]) [phone]     ID: Gavin Arroyo DOB: 1955/03/20  Gavin#: [id number]  VVO#:[id number]  Patient Care Team: Patient, No Pcp Per as PCP - General ([place]) Scot Dock, NP OTHER MD:  CHIEF COMPLAINT: iron deficiency anemia secondary to occult GIB  CURRENT TREATMENT: iron supplementation   HISTORY OF CURRENT ILLNESS:  Per hematology note for during patient hospitalization on 06/02/2019:  Gavin Arroyo presented to the ED at [facility] on 05/29/2019 with a Hb of 2.29, MCV 80.3 (prior 98.2). Workup included a ferritin of 16, iron saturation 5%, normal B12 and folate. He was transfused and received one dose of feraheme 05/31/2019 which he tolerated well.  He has had an extensive GI workup here (colonoscopy and EGD 03/16/2019, repeat 03/30/2019, EGD 04/02/2019, colonoscopy 04/07/2019, EGD 04/14/2019, [place] and CT abdomen 03/14/2019, MRI of liver 03/17/2019, nuclear medicine bleeding scan 04/08/2019) and at [place] (EGD 04/12/2019, MRI/US and CT abd 04/13/2019, enteroscopy 04/`12/2019, capsule endoscopy 04/21/2019, Occult GI bleed CT protocol 04/22/2019, nuclear medicine bleeding study 04/23/2019, repeat enteroscopy 04/27/2019, repeat GIB CT protocol 04/28/2019, repeat capsule endoscopy, EGD and colonoscopy 04/29/2019) w/o a definitive bleeding source identified. Dr Paulita Fujita speculates in his note from 06/01/2019 we may be dealing with AVMs or portal gastropathy. At this point no further studies are planned  The patient's subsequent history is as detailed below.  INTERVAL HISTORY:    REVIEW OF SYSTEMS:  PAST MEDICAL HISTORY: Past Medical History:  Diagnosis Date  . GI bleeding 03/2019    PAST SURGICAL HISTORY: Past Surgical History:  Procedure Laterality Date  . COLONOSCOPY WITH PROPOFOL N/A 03/16/2019   Procedure: COLONOSCOPY WITH PROPOFOL;  Surgeon: Ronnette Juniper, MD;  Location: [place];  Service: Gastroenterology;  Laterality:  N/A;  . COLONOSCOPY WITH PROPOFOL N/A 03/20/2019   Procedure: COLONOSCOPY WITH PROPOFOL;  Surgeon: Ronnette Juniper, MD;  Location: [place];  Service: Gastroenterology;  Laterality: N/A;  . COLONOSCOPY WITH PROPOFOL N/A 04/07/2019   Procedure: COLONOSCOPY WITH PROPOFOL;  Surgeon: Clarene Essex, MD;  Location: [place];  Service: Endoscopy;  Laterality: N/A;  . ESOPHAGOGASTRODUODENOSCOPY (EGD) WITH PROPOFOL N/A 03/16/2019   Procedure: ESOPHAGOGASTRODUODENOSCOPY (EGD) WITH PROPOFOL;  Surgeon: Ronnette Juniper, MD;  Location: [place];  Service: Gastroenterology;  Laterality: N/A;  . ESOPHAGOGASTRODUODENOSCOPY (EGD) WITH PROPOFOL N/A 04/02/2019   Procedure: ESOPHAGOGASTRODUODENOSCOPY (EGD) WITH PROPOFOL;  Surgeon: Wilford Corner, MD;  Location: [place];  Service: Endoscopy;  Laterality: N/A;  . ESOPHAGOGASTRODUODENOSCOPY (EGD) WITH PROPOFOL N/A 04/12/2019   Procedure: ESOPHAGOGASTRODUODENOSCOPY (EGD) WITH PROPOFOL;  Surgeon: Clarene Essex, MD;  Location: [place];  Service: Endoscopy;  Laterality: N/A;  And possible capsule endoscopy as well  . GIVENS CAPSULE STUDY N/A 04/12/2019   Procedure: GIVENS CAPSULE STUDY;  Surgeon: Clarene Essex, MD;  Location: [place];  Service: Endoscopy;  Laterality: N/A;  . HEMOSTASIS CLIP PLACEMENT  03/20/2019   Procedure: HEMOSTASIS CLIP PLACEMENT;  Surgeon: Ronnette Juniper, MD;  Location: [facility] ENDOSCOPY;  Service: Gastroenterology;;  . HEMOSTASIS CONTROL  03/20/2019   Procedure: HEMOSTASIS CONTROL;  Surgeon: Ronnette Juniper, MD;  Location: [facility] ENDOSCOPY;  Service: Gastroenterology;;  . POLYPECTOMY  03/16/2019   Procedure: POLYPECTOMY;  Surgeon: Ronnette Juniper, MD;  Location[facility] ENDOSCOPY;  Service: Gastroenterology;;  . POLYPECTOMY  03/20/2019   Procedure: POLYPECTOMY;  Surgeon: Ronnette Juniper, MD;  Location: [facility] ENDOSCOPY;  Service: Gastroenterology;;  . REPAIR OF PERFORATED ULCER  2010  ?    FAMILY HISTORY The patient's father died at age 32 from mesothelioma as  did both his  brothers.  They were all tile setters.  The patient's mother died from "old age" at age 42.  The patient has 1 brother, no sisters.  His brother had a stroke but no history of cancer.  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  He has not worked in about 10 years.  He describes himself as single.  His children are Gavin Arroyo, a daughter who lives in [place] and works for the credit union there; a son, Gavin Arroyo, who lives in [place] and works as a Freight forwarder of an Designer, industrial/product; and Gavin Arroyo, who lives in [place].  The patient has 3 grandchildren.  He is not a Ambulance person.    ADVANCED DIRECTIVES:  The patient does not have advanced directives but tells me he would plan to name Gavin Arroyo as his healthcare power of attorney.  She can be reached at [phone].  HEALTH MAINTENANCE: Social History   Tobacco Use  . Smoking status: Current Some Day Smoker    Packs/day: 0.25    Types: Cigarettes  . Smokeless tobacco: Never Used  Vaping Use  . Vaping Use: Never used  Substance Use Topics  . Alcohol use: Not Currently    Alcohol/week: 1.0 standard drink    Types: 1 Shots of liquor per week    Comment: liquor every day , one gallon a day   . Drug use: Yes    Types: Cocaine    Comment: every day     Colonoscopy:  PAP:  Bone density:   No Known Allergies  Current Outpatient Medications  Medication Sig Dispense Refill  . feeding supplement, ENSURE ENLIVE, (ENSURE ENLIVE) LIQD Take 237 mLs by mouth 2 (two) times daily between meals. 237 mL 12  . ferrous sulfate 325 (65 FE) MG tablet Take 1 tablet (325 mg total) by mouth daily with breakfast. 30 tablet 0  . furosemide (LASIX) 40 MG tablet Take 1 tablet (40 mg total) by mouth daily. (Patient not taking: Reported on 05/30/2019) 30 tablet 3  . lactulose (CHRONULAC) 10 GM/15ML solution Take 45 mLs (30 g total)  by mouth 3 (three) times daily. 236 mL 0  . Multiple Vitamin (MULTIVITAMIN WITH MINERALS) TABS tablet Take 1 tablet by mouth daily.    . pantoprazole (PROTONIX) 40 MG tablet Take 1 tablet (40 mg total) by mouth 2 (two) times daily. (Patient taking differently: Take 40 mg by mouth 2 (two) times daily before a meal. )    . polyvinyl alcohol (LIQUIFILM TEARS) 1.4 % ophthalmic solution Place 1 drop into both eyes as needed for dry eyes. 15 mL 0  . senna-docusate (SENOKOT-S) 8.6-50 MG tablet Take 1 tablet by mouth at bedtime as needed for mild constipation. (Patient not taking: Reported on 05/30/2019)    . spironolactone (ALDACTONE) 100 MG tablet Take 1 tablet (100 mg total) by mouth daily. (Patient not taking: Reported on 05/30/2019) 30 tablet 3  . sucralfate (CARAFATE) 1 GM/10ML suspension Take 10 mLs (1 g total) by mouth 4 (four) times daily -  with meals and at bedtime. (Patient not taking: Reported on 05/30/2019) 420 mL 0  . thiamine 100 MG tablet Take 1 tablet (100 mg total) by mouth daily.     No current facility-administered medications for this visit.    OBJECTIVE:  There were no vitals  filed for this visit.   There is no height or weight on file to calculate BMI.   Wt Readings from Last 3 Encounters:  06/02/19 138 lb 10.7 oz (62.9 kg)  04/16/19 152 lb 1.9 oz (69 kg)  03/20/19 158 lb 4.6 oz (71.8 kg)      ECOG FS:{CHL ONC [id number]} GENERAL: Patient is a well appearing male in no acute distress HEENT:  Sclerae anicteric.  Mask in place. Neck is supple.  NODES:  No cervical, supraclavicular, or axillary lymphadenopathy palpated.  BREAST EXAM:  Deferred. LUNGS:  Clear to auscultation bilaterally.  No wheezes or rhonchi. HEART:  Regular rate and rhythm. No murmur appreciated. ABDOMEN:  Soft, nontender.  Positive, normoactive bowel sounds. No organomegaly palpated. MSK:  No focal spinal tenderness to palpation. Full range of motion bilaterally in the upper extremities. EXTREMITIES:  No  peripheral edema.   SKIN:  Clear with no obvious rashes or skin changes. No nail dyscrasia. NEURO:  Nonfocal. Well oriented.  Appropriate affect.     LAB RESULTS:  CMP     Component Value Date/Time   NA 138 06/02/2019 0254   K 4.0 06/02/2019 0254   CL 109 06/02/2019 0254   CO2 20 (L) 06/02/2019 0254   GLUCOSE 98 06/02/2019 0254   BUN 8 06/02/2019 0254   CREATININE 0.76 06/02/2019 0254   CALCIUM 8.7 (L) 06/02/2019 0254   PROT 6.0 (L) 05/29/2019 1724   ALBUMIN 2.7 (L) 05/29/2019 1724   AST 30 05/29/2019 1724   ALT 19 05/29/2019 1724   ALKPHOS 72 05/29/2019 1724   BILITOT 0.9 05/29/2019 1724   GFRNONAA >60 06/02/2019 0254   GFRAA >60 06/02/2019 0254    No results found for: TOTALPROTELP, ALBUMINELP, A1GS, A2GS, BETS, BETA2SER, GAMS, MSPIKE, SPEI  No results found for: KPAFRELGTCHN, LAMBDASER, KAPLAMBRATIO  Lab Results  Component Value Date   WBC 6.8 06/02/2019   NEUTROABS 3.3 05/29/2019   HGB 8.1 (L) 06/02/2019   HCT 25.6 (L) 06/02/2019   MCV 85.3 06/02/2019   PLT 173 06/02/2019      Chemistry      Component Value Date/Time   NA 138 06/02/2019 0254   K 4.0 06/02/2019 0254   CL 109 06/02/2019 0254   CO2 20 (L) 06/02/2019 0254   BUN 8 06/02/2019 0254   CREATININE 0.76 06/02/2019 0254      Component Value Date/Time   CALCIUM 8.7 (L) 06/02/2019 0254   ALKPHOS 72 05/29/2019 1724   AST 30 05/29/2019 1724   ALT 19 05/29/2019 1724   BILITOT 0.9 05/29/2019 1724       No results found for: LABCA2  No components found for: YQIHKV425  No results for input(s): INR in the last 168 hours.  No results found for: LABCA2  No results found for: ZDG387  No results found for: FIE332  No results found for: RJJ884  No results found for: CA2729  No components found for: HGQUANT  No results found for: CEA1 / No results found for: CEA1   Lab Results  Component Value Date   AFPTUMOR 2.6 03/17/2019    No results found for: CHROMOGRNA  No results found for:  PSA1  No visits with results within 3 Day(s) from this visit.  Latest known visit with results is:  No results displayed because visit has over 200 results.      (this displays the last labs from the last 3 days)  No results found for: TOTALPROTELP, ALBUMINELP, A1GS, A2GS, BETS, BETA2SER, GAMS,  MSPIKE, SPEI (this displays SPEP labs)  No results found for: KPAFRELGTCHN, LAMBDASER, KAPLAMBRATIO (kappa/lambda light chains)  No results found for: HGBA, HGBA2QUANT, HGBFQUANT, HGBSQUAN (Hemoglobinopathy evaluation)   Lab Results  Component Value Date   LDH 135 03/17/2019    Lab Results  Component Value Date   IRON 22 (L) 05/30/2019   TIBC 431 05/30/2019   IRONPCTSAT 5 (L) 05/30/2019   (Iron and TIBC)  Lab Results  Component Value Date   FERRITIN 16 (L) 05/30/2019    Urinalysis    Component Value Date/Time   COLORURINE YELLOW 03/15/2019 0410   APPEARANCEUR CLEAR 03/15/2019 0410   LABSPEC 1.019 03/15/2019 0410   PHURINE 5.0 03/15/2019 0410   GLUCOSEU NEGATIVE 03/15/2019 0410   HGBUR NEGATIVE 03/15/2019 0410   BILIRUBINUR NEGATIVE 03/15/2019 0410   KETONESUR NEGATIVE 03/15/2019 0410   PROTEINUR NEGATIVE 03/15/2019 0410   NITRITE NEGATIVE 03/15/2019 0410   LEUKOCYTESUR NEGATIVE 03/15/2019 0410     STUDIES: No results found.  ELIGIBLE FOR AVAILABLE RESEARCH PROTOCOL: ***  ASSESSMENT: 64 y.o. 65, [place] man with GIB extensively worked up here and at [facility] without a definitive bleeding source identified, with resulting iron deficiency anemia  (1) feraheme x1 on 05/31/2019  PLAN:    Total encounter time: *** minutes  Wilber Bihari, NP 07/01/19 7:20 AM Medical Oncology and Hematology [facility] [address] [place], [place] [address] Tel. [phone]    Fax. [phone]  *Total Encounter Time as defined by the Centers for Medicare and Medicaid Services includes, in addition to the face-to-face time of a patient visit (documented in the  note above) non-face-to-face time: obtaining and reviewing outside history, ordering and reviewing medications, tests or procedures, care coordination (communications with other health care professionals or caregivers) and documentation in the medical record.

## 2019-07-08 ENCOUNTER — Inpatient Hospital Stay: Payer: Self-pay

## 2019-07-23 ENCOUNTER — Inpatient Hospital Stay: Payer: Medicaid Other

## 2019-07-23 ENCOUNTER — Inpatient Hospital Stay: Payer: Medicaid Other | Attending: Adult Health | Admitting: Adult Health

## 2019-07-23 ENCOUNTER — Other Ambulatory Visit: Payer: Self-pay

## 2019-07-23 ENCOUNTER — Encounter: Payer: Self-pay | Admitting: Adult Health

## 2019-07-23 VITALS — BP 110/65 | HR 65 | Temp 98.9°F | Resp 20 | Ht 70.0 in | Wt 153.5 lb

## 2019-07-23 DIAGNOSIS — D500 Iron deficiency anemia secondary to blood loss (chronic): Secondary | ICD-10-CM

## 2019-07-23 DIAGNOSIS — F1010 Alcohol abuse, uncomplicated: Secondary | ICD-10-CM | POA: Diagnosis not present

## 2019-07-23 DIAGNOSIS — D509 Iron deficiency anemia, unspecified: Secondary | ICD-10-CM | POA: Insufficient documentation

## 2019-07-23 DIAGNOSIS — F17210 Nicotine dependence, cigarettes, uncomplicated: Secondary | ICD-10-CM | POA: Insufficient documentation

## 2019-07-23 DIAGNOSIS — Z79899 Other long term (current) drug therapy: Secondary | ICD-10-CM | POA: Insufficient documentation

## 2019-07-23 LAB — CMP (CANCER CENTER ONLY)
ALT: 24 U/L (ref 0–44)
AST: 44 U/L — ABNORMAL HIGH (ref 15–41)
Albumin: 3.4 g/dL — ABNORMAL LOW (ref 3.5–5.0)
Alkaline Phosphatase: 119 U/L (ref 38–126)
Anion gap: 10 (ref 5–15)
BUN: 5 mg/dL — ABNORMAL LOW (ref 8–23)
CO2: 19 mmol/L — ABNORMAL LOW (ref 22–32)
Calcium: 9.6 mg/dL (ref 8.9–10.3)
Chloride: 111 mmol/L (ref 98–111)
Creatinine: 0.76 mg/dL (ref 0.61–1.24)
GFR, Est AFR Am: >60 mL/min (ref >60)
GFR, Estimated: >60 mL/min (ref >60)
Glucose, Bld: 107 mg/dL — ABNORMAL HIGH (ref 70–99)
Potassium: 4.1 mmol/L (ref 3.5–5.1)
Sodium: 140 mmol/L (ref 135–145)
Total Bilirubin: 0.7 mg/dL (ref 0.3–1.2)
Total Protein: 7.3 g/dL (ref 6.5–8.1)

## 2019-07-23 LAB — RETICULOCYTES
Immature Retic Fract: 12.4 % (ref 2.3–15.9)
RBC.: 4.54 MIL/uL (ref 4.22–5.81)
Retic Count, Absolute: 95.8 10*3/uL (ref 19.0–186.0)
Retic Ct Pct: 2.1 % (ref 0.4–3.1)

## 2019-07-23 LAB — CBC WITH DIFFERENTIAL (CANCER CENTER ONLY)
Abs Immature Granulocytes: 0.01 10*3/uL (ref 0.00–0.07)
Basophils Absolute: 0.1 10*3/uL (ref 0.0–0.1)
Basophils Relative: 1 %
Eosinophils Absolute: 1.2 10*3/uL — ABNORMAL HIGH (ref 0.0–0.5)
Eosinophils Relative: 21 %
HCT: 43.5 % (ref 39.0–52.0)
Hemoglobin: 14.2 g/dL (ref 13.0–17.0)
Immature Granulocytes: 0 %
Lymphocytes Relative: 26 %
Lymphs Abs: 1.5 10*3/uL (ref 0.7–4.0)
MCH: 31.1 pg (ref 26.0–34.0)
MCHC: 32.6 g/dL (ref 30.0–36.0)
MCV: 95.4 fL (ref 80.0–100.0)
Monocytes Absolute: 0.7 10*3/uL (ref 0.1–1.0)
Monocytes Relative: 12 %
Neutro Abs: 2.2 10*3/uL (ref 1.7–7.7)
Neutrophils Relative %: 40 %
Platelet Count: 138 10*3/uL — ABNORMAL LOW (ref 150–400)
RBC: 4.56 MIL/uL (ref 4.22–5.81)
RDW: 14.2 % (ref 11.5–15.5)
WBC Count: 5.6 10*3/uL (ref 4.0–10.5)
nRBC: 0 % (ref 0.0–0.2)

## 2019-07-23 LAB — SAVE SMEAR(SSMR), FOR PROVIDER SLIDE REVIEW

## 2019-07-23 NOTE — Progress Notes (Addendum)
[place]  [phone] [phone]     ID: ERICA OSUNA DOB: 04/03/1955  MR#: [id number]  EUM#:[id number]  Patient Care Team: Patient, No Pcp Per as PCP - General ([place]) Scot Dock, NP OTHER MD:  CHIEF COMPLAINT: iron deficiency anemia secondary to occult GIB  CURRENT TREATMENT: iron supplementation   HISTORY OF CURRENT ILLNESS:  Per hematology note for during patient hospitalization on 06/02/2019:  Mr Alldredge presented to the ED at [facility] on 05/29/2019 with a Hb of 2.29, MCV 80.3 (prior 98.2). Workup included a ferritin of 16, iron saturation 5%, normal B12 and folate. He was transfused and received one dose of feraheme 05/31/2019 which he tolerated well.  He has had an extensive GI workup here (colonoscopy and EGD 03/16/2019, repeat 03/30/2019, EGD 04/02/2019, colonoscopy 04/07/2019, EGD 04/14/2019, [place] and CT abdomen 03/14/2019, MRI of liver 03/17/2019, nuclear medicine bleeding scan 04/08/2019) and at [facility] (EGD 04/12/2019, MRI/US and CT abd 04/13/2019, enteroscopy 04/20/2019, capsule endoscopy 04/21/2019, Occult GI bleed CT protocol 04/22/2019, nuclear medicine bleeding study 04/23/2019, repeat enteroscopy 04/27/2019, repeat GIB CT protocol 04/28/2019, repeat capsule endoscopy, EGD and colonoscopy 04/29/2019) w/o a definitive bleeding source identified. Dr Paulita Fujita speculates in his note from 06/01/2019 we may be dealing with AVMs or portal gastropathy. At this point no further studies are planned  The patient's subsequent history is as detailed below.  INTERVAL HISTORY: Asahd is here with Selinda Eon, his friend for f/u of his previous hospitalization.  He is feeling better.  He was heavily drinking at the time, and has since stopped, and his hemoglobin has normalized.  He was drinking liquor extensively (a fifth and a pint per day) and has since quit.  He now drinks a 6 pack a day and notes he doesn't crave liquor.  He is a light smoker  about 1/2ppd.  He is not at the point where he is ready to quit.    He is taking a multivitamin and iron supplement daily, both of which he tolerates quite well.    REVIEW OF SYSTEMS: Tiquan notes that he is feeling much better.  He has no fever or chills.  He is without nausea, vomiting, bowel/bladder changes, headaches, vision issues, fatigue, dizziness, cough, shortness of breath, or chest pain.  He has a new PCP in [place], [place] and plans on following up there in a few weeks.  A detailed ROS was otherwise non contributory.    PAST MEDICAL HISTORY: Past Medical History:  Diagnosis Date  . GI bleeding 03/2019    PAST SURGICAL HISTORY: Past Surgical History:  Procedure Laterality Date  . COLONOSCOPY WITH PROPOFOL N/A 03/16/2019   Procedure: COLONOSCOPY WITH PROPOFOL;  Surgeon: Ronnette Juniper, MD;  Location: [place];  Service: Gastroenterology;  Laterality: N/A;  . COLONOSCOPY WITH PROPOFOL N/A 03/20/2019   Procedure: COLONOSCOPY WITH PROPOFOL;  Surgeon: Ronnette Juniper, MD;  Location: [place];  Service: Gastroenterology;  Laterality: N/A;  . COLONOSCOPY WITH PROPOFOL N/A 04/07/2019   Procedure: COLONOSCOPY WITH PROPOFOL;  Surgeon: Clarene Essex, MD;  Location: [place];  Service: Endoscopy;  Laterality: N/A;  . ESOPHAGOGASTRODUODENOSCOPY (EGD) WITH PROPOFOL N/A 03/16/2019   Procedure: ESOPHAGOGASTRODUODENOSCOPY (EGD) WITH PROPOFOL;  Surgeon: Ronnette Juniper, MD;  Location: [place];  Service: Gastroenterology;  Laterality: N/A;  . ESOPHAGOGASTRODUODENOSCOPY (EGD) WITH PROPOFOL N/A 04/02/2019   Procedure: ESOPHAGOGASTRODUODENOSCOPY (EGD) WITH PROPOFOL;  Surgeon: Wilford Corner, MD;  Location: [place];  Service: Endoscopy;  Laterality: N/A;  . ESOPHAGOGASTRODUODENOSCOPY (EGD) WITH PROPOFOL N/A 04/12/2019  Procedure: ESOPHAGOGASTRODUODENOSCOPY (EGD) WITH PROPOFOL;  Surgeon: Clarene Essex, MD;  Location: [place];  Service: Endoscopy;  Laterality: N/A;  And possible capsule endoscopy as  well  . GIVENS CAPSULE STUDY N/A 04/12/2019   Procedure: GIVENS CAPSULE STUDY;  Surgeon: Clarene Essex, MD;  Location: [place];  Service: Endoscopy;  Laterality: N/A;  . HEMOSTASIS CLIP PLACEMENT  03/20/2019   Procedure: HEMOSTASIS CLIP PLACEMENT;  Surgeon: Ronnette Juniper, MD;  Location: [facility] ENDOSCOPY;  Service: Gastroenterology;;  . HEMOSTASIS CONTROL  03/20/2019   Procedure: HEMOSTASIS CONTROL;  Surgeon: Ronnette Juniper, MD;  Location: [facility] ENDOSCOPY;  Service: Gastroenterology;;  . POLYPECTOMY  03/16/2019   Procedure: POLYPECTOMY;  Surgeon: Ronnette Juniper, MD;  Location: [facility] ENDOSCOPY;  Service: Gastroenterology;;  . POLYPECTOMY  03/20/2019   Procedure: POLYPECTOMY;  Surgeon: Ronnette Juniper, MD;  Location: [facility] ENDOSCOPY;  Service: Gastroenterology;;  . REPAIR OF PERFORATED ULCER  2010  ?    FAMILY HISTORY The patient's father died at age 56 from mesothelioma as did both his brothers.  They were all tile setters.  The patient's mother died from "old age" at age 47.  The patient has 1 brother, no sisters.  His brother had a stroke but no history of cancer.  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  He has not worked in about 10 years.  He describes himself as single.  His children are Leonard Downing, a daughter who lives in [place] and works for the credit union there; a son, Thurmond Butts, who lives in [place] and works as a Freight forwarder of an Designer, industrial/product; and Wolfhurst, who lives in [place].  The patient has 3 grandchildren.  He is not a Ambulance person.    ADVANCED DIRECTIVES:  The patient does not have advanced directives but tells me he would plan to name Myriam Jacobson as his healthcare power of attorney.  She can be reached at [phone].  HEALTH MAINTENANCE: Social History   Tobacco Use  . Smoking status: Current Some Day Smoker    Packs/day: 0.25    Types: Cigarettes  .  Smokeless tobacco: Never Used  Vaping Use  . Vaping Use: Never used  Substance Use Topics  . Alcohol use: Not Currently    Alcohol/week: 1.0 standard drink    Types: 1 Shots of liquor per week    Comment: liquor every day , one gallon a day   . Drug use: Yes    Types: Cocaine    Comment: every day     Colonoscopy: 05/2019  PSA: never   Allergies  Allergen Reactions  . Vicodin [Hydrocodone-Acetaminophen] Itching    Current Outpatient Medications  Medication Sig Dispense Refill  . ferrous sulfate 325 (65 FE) MG tablet Take 1 tablet (325 mg total) by mouth daily with breakfast. 30 tablet 0  . Multiple Vitamin (MULTIVITAMIN WITH MINERALS) TABS tablet Take 1 tablet by mouth daily.     No current facility-administered medications for this visit.    OBJECTIVE:  Vitals:   07/23/19 0859  BP: 110/65  Pulse: 65  Resp: 20  Temp: 98.9 F (37.2 C)  SpO2: 98%     Body mass index is 22.02 kg/m.   Wt Readings from Last 3 Encounters:  07/23/19 153 lb 8 oz (69.6 kg)  06/02/19 138 lb 10.7 oz (62.9 kg)  04/16/19 152 lb 1.9  oz (69 kg)      ECOG FS:0 - Asymptomatic GENERAL: Patient is a well appearing male in no acute distress HEENT:  Sclerae anicteric.  Mask in place. Neck is supple.  NODES:  No cervical, supraclavicular, or axillary lymphadenopathy palpated.  LUNGS:  Clear to auscultation bilaterally.  No wheezes or rhonchi. HEART:  Regular rate and rhythm. No murmur appreciated. ABDOMEN:  Soft, nontender.  Positive, normoactive bowel sounds. No organomegaly palpated. MSK:  No focal spinal tenderness to palpation. Full range of motion bilaterally in the upper extremities. EXTREMITIES:  No peripheral edema.   SKIN:  Clear with no obvious rashes or skin changes. No nail dyscrasia. NEURO:  Nonfocal. Well oriented.  Appropriate affect.     LAB RESULTS:  CMP     Component Value Date/Time   NA 140 07/23/2019 0828   K 4.1 07/23/2019 0828   CL 111 07/23/2019 0828   CO2 19 (L)  07/23/2019 0828   GLUCOSE 107 (H) 07/23/2019 0828   BUN 5 (L) 07/23/2019 0828   CREATININE 0.76 07/23/2019 0828   CALCIUM 9.6 07/23/2019 0828   PROT 7.3 07/23/2019 0828   ALBUMIN 3.4 (L) 07/23/2019 0828   AST 44 (H) 07/23/2019 0828   ALT 24 07/23/2019 0828   ALKPHOS 119 07/23/2019 0828   BILITOT 0.7 07/23/2019 0828   GFRNONAA >60 07/23/2019 0828   GFRAA >60 07/23/2019 0828    No results found for: TOTALPROTELP, ALBUMINELP, A1GS, A2GS, BETS, BETA2SER, GAMS, MSPIKE, SPEI  No results found for: Nils Pyle, [facility]  Lab Results  Component Value Date   WBC 5.6 07/23/2019   NEUTROABS 2.2 07/23/2019   HGB 14.2 07/23/2019   HCT 43.5 07/23/2019   MCV 95.4 07/23/2019   PLT 138 (L) 07/23/2019      Chemistry      Component Value Date/Time   NA 140 07/23/2019 0828   K 4.1 07/23/2019 0828   CL 111 07/23/2019 0828   CO2 19 (L) 07/23/2019 0828   BUN 5 (L) 07/23/2019 0828   CREATININE 0.76 07/23/2019 0828      Component Value Date/Time   CALCIUM 9.6 07/23/2019 0828   ALKPHOS 119 07/23/2019 0828   AST 44 (H) 07/23/2019 0828   ALT 24 07/23/2019 0828   BILITOT 0.7 07/23/2019 0828       No results found for: LABCA2  No components found for: WERXVQ008  No results for input(s): INR in the last 168 hours.  No results found for: LABCA2  No results found for: QPY195  No results found for: KDT267  No results found for: TIW580  No results found for: CA2729  No components found for: HGQUANT  No results found for: CEA1 / No results found for: CEA1   Lab Results  Component Value Date   AFPTUMOR 2.6 03/17/2019    No results found for: [place]  No results found for: PSA1  Appointment on 07/23/2019  Component Date Value Ref Range Status  . Retic Ct Pct 07/23/2019 2.1  0.4 - 3.1 % Final  . RBC. 07/23/2019 4.54  4.22 - 5.81 MIL/uL Final  . Retic Count, Absolute 07/23/2019 95.8  19.0 - 186.0 K/uL Final  . Immature Retic Fract 07/23/2019 12.4  2.3 -  15.9 % Final   Performed at [facility], [place] [address] [place], [place] [address]  . Smear Review 07/23/2019 SMEAR STAINED AND AVAILABLE FOR REVIEW   Final   Performed at [facility], [address]  [place], [place] [address]  . Sodium 07/23/2019 140  135 - 145 mmol/L Final  . Potassium 07/23/2019 4.1  3.5 - 5.1 mmol/L Final  . Chloride 07/23/2019 111  98 - 111 mmol/L Final  . CO2 07/23/2019 19* 22 - 32 mmol/L Final  . Glucose, Bld 07/23/2019 107* 70 - 99 mg/dL Final   Glucose reference range applies only to samples taken after fasting for at least 8 hours.  . BUN 07/23/2019 5* 8 - 23 mg/dL Final  . Creatinine 07/23/2019 0.76  0.61 - 1.24 mg/dL Final  . Calcium 07/23/2019 9.6  8.9 - 10.3 mg/dL Final  . Total Protein 07/23/2019 7.3  6.5 - 8.1 g/dL Final  . Albumin 07/23/2019 3.4* 3.5 - 5.0 g/dL Final  . AST 07/23/2019 44* 15 - 41 U/L Final  . ALT 07/23/2019 24  0 - 44 U/L Final  . Alkaline Phosphatase 07/23/2019 119  38 - 126 U/L Final  . Total Bilirubin 07/23/2019 0.7  0.3 - 1.2 mg/dL Final  . GFR, Est Non Af Am 07/23/2019 >60  >60 mL/min Final  . GFR, Est AFR Am 07/23/2019 >60  >60 mL/min Final  . Anion gap 07/23/2019 10  5 - 15 Final   Performed at [facility], [place] [address] [place], [place] [address]  . WBC Count 07/23/2019 5.6  4.0 - 10.5 K/uL Final  . RBC 07/23/2019 4.56  4.22 - 5.81 MIL/uL Final  . Hemoglobin 07/23/2019 14.2  13.0 - 17.0 g/dL Final  . HCT 07/23/2019 43.5  39 - 52 % Final  . MCV 07/23/2019 95.4  80.0 - 100.0 fL Final  . MCH 07/23/2019 31.1  26.0 - 34.0 pg Final  . MCHC 07/23/2019 32.6  30.0 - 36.0 g/dL Final  . RDW 07/23/2019 14.2  11.5 - 15.5 % Final  . Platelet Count 07/23/2019 138* 150 - 400 K/uL Final  . nRBC 07/23/2019 0.0  0.0 - 0.2 % Final  . Neutrophils Relative % 07/23/2019 40  % Final  . Neutro Abs 07/23/2019 2.2  1.7 - 7.7 K/uL Final  . Lymphocytes Relative 07/23/2019  26  % Final  . Lymphs Abs 07/23/2019 1.5  0.7 - 4.0 K/uL Final  . Monocytes Relative 07/23/2019 12  % Final  . Monocytes Absolute 07/23/2019 0.7  0 - 1 K/uL Final  . Eosinophils Relative 07/23/2019 21  % Final  . Eosinophils Absolute 07/23/2019 1.2* 0 - 0 K/uL Final  . Basophils Relative 07/23/2019 1  % Final  . Basophils Absolute 07/23/2019 0.1  0 - 0 K/uL Final  . Immature Granulocytes 07/23/2019 0  % Final  . Abs Immature Granulocytes 07/23/2019 0.01  0.00 - 0.07 K/uL Final   Performed at [facility], [place] [address] [place], [place] [address]    (this displays the last labs from the last 3 days)  No results found for: TOTALPROTELP, ALBUMINELP, A1GS, A2GS, BETS, BETA2SER, GAMS, MSPIKE, SPEI (this displays SPEP labs)  No results found for: KPAFRELGTCHN, LAMBDASER, KAPLAMBRATIO (kappa/lambda light chains)  No results found for: HGBA, HGBA2QUANT, HGBFQUANT, HGBSQUAN (Hemoglobinopathy evaluation)   Lab Results  Component Value Date   LDH 135 03/17/2019    Lab Results  Component Value Date   IRON 22 (L) 05/30/2019   TIBC 431 05/30/2019   IRONPCTSAT 5 (L) 05/30/2019   (Iron and TIBC)  Lab Results  Component Value Date   FERRITIN 16 (L) 05/30/2019    Urinalysis    Component Value Date/Time  COLORURINE YELLOW 03/15/2019 0410   APPEARANCEUR CLEAR 03/15/2019 0410   LABSPEC 1.019 03/15/2019 0410   PHURINE 5.0 03/15/2019 0410   GLUCOSEU NEGATIVE 03/15/2019 0410   HGBUR NEGATIVE 03/15/2019 0410   BILIRUBINUR NEGATIVE 03/15/2019 0410   KETONESUR NEGATIVE 03/15/2019 0410   PROTEINUR NEGATIVE 03/15/2019 0410   NITRITE NEGATIVE 03/15/2019 0410   LEUKOCYTESUR NEGATIVE 03/15/2019 0410     STUDIES: No results found.    ASSESSMENT: 64 y.o. 101, [place] man with GIB extensively worked up here and at [facility] without a definitive bleeding source identified, with resulting iron deficiency anemia  (1) feraheme x1 on 05/31/2019 (previously received a  dose in 04/2019 as well)  (2) ETOH abuse  (a) quit drinking a gallon of liquor per day in 05/2019 after hospitalization  (b) hemoglobin today 07/23/2019 is normal  PLAN:  Maximilian is doing much better today.  His hemoglobin is 14.2!  This is great news.   He has had no further bleeding or fatigue since being discharged from the hospital.  I attribute this improvement to his quitting drinking liquor and cutting back drastically on his ETOH intake.  I congratulated him about this effort.  He is not having any difficulty with quitting all of the ETOH which is good.  He continues to drink 2-3 beers a day.  I encouraged him to limit that as much as he can, as ETOH can act as a poison to the marrow.  Christan is also smoking every day.  He is not yet ready to quit, I recommended smoking cessation and reviewed that smoking increases the risk of several different cancer, heart disease, and other chronic conditions.    Shanon Brow met with Dr. Jana Hakim who reviewed his labs and reminded him to drink as little as possible since the ETOH can irritate the stomach and esophagus and lead to bleeding.  He will also continue his oral iron supplement and multivitamin daily.    Ajmal will f/u with his PCP, and we do not need to see him on a regular basis at this point.  If at any point in the future he becomes anemic again, we are happy to see him back.    Total encounter time: 30 minutes*   Wilber Bihari, NP 07/23/19 9:40 AM Medical Oncology and Hematology [facility] [place], [place] [address] Tel. [phone]    Fax. [phone]   ADDENDUM: Zebedee looks terrific currently and he has a normal hemoglobin.  He has achieved this by receiving Feraheme x1 and continuing oral supplementation and also by discontinuing heavy liquor intake.  He is now down to 2-3 beers a day and we have recommended that if possible he cut back further on that.  I think it would be prudent for him to receive a  second dose of Feraheme and that has been scheduled for 07/30/2019.  Otherwise she should continue on oral iron supplementation a minimum of 1 year so we can replenish his iron stores  At this point I am comfortable releasing her back to his primary care and other physicians.  We are not making any future routine appointments for him here but I will be glad to see [place] at any point in the future if and when the need arises.  I personally saw this patient and performed a substantive portion of this encounter with the listed APP documented above.   Chauncey Cruel, MD Medical Oncology and Hematology [facility] [place],  [place] [address] Tel. [phone]    Fax. [phone]   *Total Encounter Time as defined by the Centers for Medicare and Medicaid Services includes, in addition to the face-to-face time of a patient visit (documented in the note above) non-face-to-face time: obtaining and reviewing outside history, ordering and reviewing medications, tests or procedures, care coordination (communications with other health care professionals or caregivers) and documentation in the medical record.

## 2019-07-24 ENCOUNTER — Telehealth: Payer: Self-pay | Admitting: Adult Health

## 2019-07-24 NOTE — Telephone Encounter (Signed)
No 7/15 los. No changes made to pt's schedule.

## 2019-07-28 ENCOUNTER — Telehealth: Payer: Self-pay | Admitting: Adult Health

## 2019-07-28 NOTE — Telephone Encounter (Signed)
No 7/15 LOS. No changes made to pt's schedule.

## 2019-07-30 ENCOUNTER — Inpatient Hospital Stay: Payer: Medicaid Other

## 2019-08-19 ENCOUNTER — Encounter: Payer: Self-pay | Admitting: Adult Health

## 2019-08-19 NOTE — Progress Notes (Signed)
Patient called with medication concern.  Staff message sent to [facility] NP for follow up as well as contact number for PCP was given which he states he misplaced.

## 2019-08-24 ENCOUNTER — Encounter: Payer: Self-pay | Admitting: Pharmacy Technician

## 2019-08-24 NOTE — Progress Notes (Signed)
Patient has been approved for drug assistance by [employer] for Feraheme. The enrollment period is from 08/17/19-08/15/20 based on self pay. First DOS covered is TBA.

## 2020-03-12 ENCOUNTER — Encounter (HOSPITAL_COMMUNITY): Payer: Self-pay | Admitting: Internal Medicine

## 2020-03-12 ENCOUNTER — Telehealth: Payer: Self-pay | Admitting: Internal Medicine

## 2020-03-12 ENCOUNTER — Inpatient Hospital Stay (HOSPITAL_COMMUNITY)
Admission: AD | Admit: 2020-03-12 | Discharge: 2020-03-14 | DRG: 812 | Disposition: A | Discharge status: Home / Self Care | Payer: Medicaid Other | Source: Other Acute Inpatient Hospital | Attending: Internal Medicine | Admitting: Internal Medicine

## 2020-03-12 DIAGNOSIS — K7031 Alcoholic cirrhosis of liver with ascites: Secondary | ICD-10-CM

## 2020-03-12 DIAGNOSIS — K7460 Unspecified cirrhosis of liver: Secondary | ICD-10-CM | POA: Diagnosis present

## 2020-03-12 DIAGNOSIS — F17210 Nicotine dependence, cigarettes, uncomplicated: Secondary | ICD-10-CM | POA: Diagnosis present

## 2020-03-12 DIAGNOSIS — D62 Acute posthemorrhagic anemia: Secondary | ICD-10-CM | POA: Diagnosis present

## 2020-03-12 DIAGNOSIS — F1010 Alcohol abuse, uncomplicated: Secondary | ICD-10-CM

## 2020-03-12 DIAGNOSIS — Z20822 Contact with and (suspected) exposure to covid-19: Secondary | ICD-10-CM | POA: Diagnosis present

## 2020-03-12 DIAGNOSIS — Z8711 Personal history of peptic ulcer disease: Secondary | ICD-10-CM

## 2020-03-12 DIAGNOSIS — D649 Anemia, unspecified: Secondary | ICD-10-CM | POA: Diagnosis not present

## 2020-03-12 DIAGNOSIS — K7030 Alcoholic cirrhosis of liver without ascites: Secondary | ICD-10-CM | POA: Diagnosis present

## 2020-03-12 DIAGNOSIS — H538 Other visual disturbances: Secondary | ICD-10-CM | POA: Diagnosis present

## 2020-03-12 DIAGNOSIS — K921 Melena: Secondary | ICD-10-CM | POA: Diagnosis present

## 2020-03-12 DIAGNOSIS — Y900 Blood alcohol level of less than 20 mg/100 ml: Secondary | ICD-10-CM | POA: Diagnosis present

## 2020-03-12 DIAGNOSIS — K922 Gastrointestinal hemorrhage, unspecified: Secondary | ICD-10-CM | POA: Diagnosis present

## 2020-03-12 LAB — COMPREHENSIVE METABOLIC PANEL
ALT: 37 U/L (ref 0–44)
AST: 44 U/L — ABNORMAL HIGH (ref 15–41)
Albumin: 2.9 g/dL — ABNORMAL LOW (ref 3.5–5.0)
Alkaline Phosphatase: 72 U/L (ref 38–126)
Anion gap: 13 (ref 5–15)
BUN: 16 mg/dL (ref 8–23)
CO2: 14 mmol/L — ABNORMAL LOW (ref 22–32)
Calcium: 8.1 mg/dL — ABNORMAL LOW (ref 8.9–10.3)
Chloride: 111 mmol/L (ref 98–111)
Creatinine, Ser: 1.07 mg/dL (ref 0.61–1.24)
GFR, Estimated: >60 mL/min (ref >60)
Glucose, Bld: 122 mg/dL — ABNORMAL HIGH (ref 70–99)
Potassium: 4.2 mmol/L (ref 3.5–5.1)
Sodium: 138 mmol/L (ref 135–145)
Total Bilirubin: 1.1 mg/dL (ref 0.3–1.2)
Total Protein: 6.2 g/dL — ABNORMAL LOW (ref 6.5–8.1)

## 2020-03-12 LAB — MAGNESIUM: Magnesium: 2.1 mg/dL (ref 1.7–2.4)

## 2020-03-12 LAB — PROTIME-INR
INR: 1.2 (ref 0.8–1.2)
Prothrombin Time: 15 seconds (ref 11.4–15.2)

## 2020-03-12 LAB — CBC
HCT: 22.1 % — ABNORMAL LOW (ref 39.0–52.0)
Hemoglobin: 6.6 g/dL — CL (ref 13.0–17.0)
MCH: 29.5 pg (ref 26.0–34.0)
MCHC: 29.9 g/dL — ABNORMAL LOW (ref 30.0–36.0)
MCV: 98.7 fL (ref 80.0–100.0)
Platelets: 204 10*3/uL (ref 150–400)
RBC: 2.24 MIL/uL — ABNORMAL LOW (ref 4.22–5.81)
RDW: 19.2 % — ABNORMAL HIGH (ref 11.5–15.5)
WBC: 7.2 10*3/uL (ref 4.0–10.5)
nRBC: 0.4 % — ABNORMAL HIGH (ref 0.0–0.2)

## 2020-03-12 LAB — PHOSPHORUS: Phosphorus: 4.1 mg/dL (ref 2.5–4.6)

## 2020-03-12 LAB — AMMONIA: Ammonia: 64 umol/L — ABNORMAL HIGH (ref 9–35)

## 2020-03-12 LAB — ETHANOL: Alcohol, Ethyl (B): <10 mg/dL (ref <10)

## 2020-03-12 MED ORDER — THIAMINE HCL 100 MG PO TABS
100.0000 mg | ORAL_TABLET | Freq: Every day | ORAL | Status: DC
  Administered 2020-03-14: 100 mg via ORAL
  Filled 2020-03-12 (×2): qty 1

## 2020-03-12 MED ORDER — CHLORHEXIDINE GLUCONATE CLOTH 2 % EX PADS
6.0000 | MEDICATED_PAD | Freq: Every day | CUTANEOUS | Status: DC
  Administered 2020-03-13: 6 via TOPICAL

## 2020-03-12 MED ORDER — ADULT MULTIVITAMIN W/MINERALS CH
1.0000 | ORAL_TABLET | Freq: Every day | ORAL | Status: DC
  Administered 2020-03-13 – 2020-03-14 (×2): 1 via ORAL
  Filled 2020-03-12 (×3): qty 1

## 2020-03-12 MED ORDER — SODIUM CHLORIDE 0.9 % IV SOLN
8.0000 mg/h | INTRAVENOUS | Status: DC
  Administered 2020-03-12 – 2020-03-14 (×4): 8 mg/h via INTRAVENOUS
  Filled 2020-03-12 (×6): qty 80

## 2020-03-12 MED ORDER — LORAZEPAM 1 MG PO TABS: 1.0000 mg | ORAL_TABLET | ORAL | Status: DC | PRN

## 2020-03-12 MED ORDER — SODIUM CHLORIDE 0.9 % IV SOLN
75.0000 mL/h | INTRAVENOUS | Status: AC
  Administered 2020-03-12: 75 mL/h via INTRAVENOUS

## 2020-03-12 MED ORDER — ACETAMINOPHEN 650 MG RE SUPP: 650.0000 mg | Freq: Four times a day (QID) | RECTAL | Status: DC | PRN

## 2020-03-12 MED ORDER — ACETAMINOPHEN 325 MG PO TABS: 650.0000 mg | ORAL_TABLET | Freq: Four times a day (QID) | ORAL | Status: DC | PRN

## 2020-03-12 MED ORDER — SODIUM CHLORIDE 0.9 % IV SOLN
2.0000 g | INTRAVENOUS | Status: DC
  Administered 2020-03-13 – 2020-03-14 (×2): 2 g via INTRAVENOUS
  Filled 2020-03-12 (×2): qty 20

## 2020-03-12 MED ORDER — LORAZEPAM 2 MG/ML IJ SOLN
1.0000 mg | INTRAMUSCULAR | Status: DC | PRN
Start: 2020-03-12 — End: 2020-03-14

## 2020-03-12 MED ORDER — THIAMINE HCL 100 MG/ML IJ SOLN
100.0000 mg | Freq: Every day | INTRAMUSCULAR | Status: DC
  Administered 2020-03-13: 100 mg via INTRAVENOUS
  Filled 2020-03-12: qty 2

## 2020-03-12 MED ORDER — THIAMINE HCL 100 MG/ML IJ SOLN: 100.0000 mg | Freq: Every day | INTRAMUSCULAR | Status: DC

## 2020-03-12 MED ORDER — SODIUM CHLORIDE 0.9 % IV SOLN
50.0000 ug/h | INTRAVENOUS | Status: DC
  Administered 2020-03-12 – 2020-03-14 (×4): 50 ug/h via INTRAVENOUS
  Filled 2020-03-12 (×8): qty 1

## 2020-03-12 MED ORDER — PANTOPRAZOLE SODIUM 40 MG IV SOLR: 40.0000 mg | Freq: Two times a day (BID) | INTRAVENOUS | Status: DC

## 2020-03-12 MED ORDER — FOLIC ACID 1 MG PO TABS
1.0000 mg | ORAL_TABLET | Freq: Every day | ORAL | Status: DC
  Administered 2020-03-13 – 2020-03-14 (×2): 1 mg via ORAL
  Filled 2020-03-12 (×3): qty 1

## 2020-03-12 MED ORDER — SODIUM CHLORIDE 0.9% IV SOLUTION: Freq: Once | INTRAVENOUS | Status: DC

## 2020-03-12 NOTE — Telephone Encounter (Signed)
hgb 3.6 Melena x days Cirrhosis  Hemodynamically stable  No GI at [facility]  I said I would see the patient in consultation if hospitalist service accepted the patient

## 2020-03-12 NOTE — H&P (Signed)
Gavin Arroyo:[id number] DOB: 10/10/1955 DOA: 03/12/2020    PCP: [name], NP   Outpatient Specialists:  Oncology PA Wilber Bihari     Referred by Attending Toy Baker, MD   Patient coming from: home Lives  With roomates    Chief Complaint: Melena for 3 days  HPI: Gavin Arroyo is a 65 y.o. male with medical history significant of  ETOH Cirrhosis, gastric ulcers, hx ruptured peptic ulcer 11y ago    Presented with   1 week of melanotic stools and weakness. He reports he have had some blood per rectum intermittently. About once a month he would get some red stools too.   He felt very short of breath and blurry vision. Reports CP if walked to the bathroom. He has been taking iron and Multivitamins No hemat emesis  Was seen at [facility] in April 2021 for GI bleed with hemoglobin down to 2.6 on admission thought to be likely secondary to slow GI loss have had extensive work-up in the past secondary to recurrent GI blood loss felt to be secondary to AVMs. Have had had Endoscopy showing small bowel bleed and was sent to [place] colonoscopy upper endoscopy and capsule endoscopy was done at [facility] in April 2021 unable to obtain records this time from [facility] But patient states they could never find anything CT scan done at that time showed sequela of portal hypertension moderate ascites esophageal and gastric varices He required blood transfusion at that time  He went back [facility] and had to get more blood  Was seen by Hematology in [place]  Reports still drinking but much less 2 beers a day now No hx of DT's In the past he has been prescribed Aldactone Lasix and lactulose but have not been taking it  Infectious risk factors:  Reports  shortness of breath    Has   been vaccinated against COVID not boosted   Initial COVID TEST   in house  PCR testing  Pending  Lab Results  Component Value Date   [place] NEGATIVE 05/29/2019   [place] NEGATIVE 03/30/2019    [place] NEGATIVE 03/19/2019   [place] NEGATIVE 03/14/2019    Regarding pertinent Chronic problems:       Liver disease MELD-Na score: 10 at 05/31/2019     Chronic anemia - baseline hg Hemoglobin & Hematocrit  Recent Labs    06/01/19 0341 06/02/19 0254 07/23/19 0828  HGB 8.1* 8.1* 14.2    While in ER: HGB 3.6 MCV 96 INR1.1 VSS 112/56 p 75.  NA 138 k = 3.5, bun= 15 Cr-0.9.  Patient being transfused 2 units PRBCs.  Patient on ppi gtt, octreotide gtt , IV Rocephin.    ECG: Ordered Personally reviewed by me showing: HR : 89 Rhythm:  NSR,     no evidence of ischemic changes QTC 472 _______________________________________________________ ER Provider Called:  [facility]    Dr.Gessner  They Recommend admit to medicine   Will see in AM    _______________________________________________ Hospitalist was called for admission for Upper gi bleed    OTHER Significant initial  Findings:  labs showing:    Recent Labs  Lab 03/12/20 2208  NA 138  K 4.2  CO2 14*  GLUCOSE 122*  BUN 16  CREATININE 1.07  CALCIUM 8.1*  MG 2.1  PHOS 4.1    Cr     Up from baseline see below Lab Results  Component Value Date   CREATININE 1.07 03/12/2020  CREATININE 0.76 07/23/2019   CREATININE 0.76 06/02/2019    Recent Labs  Lab 03/12/20 2208  AST 44*  ALT 37  ALKPHOS 72  BILITOT 1.1  PROT 6.2*  ALBUMIN 2.9*   Lab Results  Component Value Date   CALCIUM 8.1 (L) 03/12/2020   PHOS 4.1 03/12/2020          Plt: Lab Results  Component Value Date   PLT 204 03/12/2020       After 2 units Recent Labs  Lab 03/12/20 2208  WBC 7.2  HGB 6.6*  HCT 22.1*  MCV 98.7  PLT 204     Recent Labs  Lab 03/12/20 2208  AMMONIA 64*      DM  labs:  HbA1C: Recent Labs    05/30/19 0944  HGBA1C 5.3     Cultures:    Component Value Date/Time   SDES BLOOD RIGHT HAND 03/30/2019 1040   SPECREQUEST  03/30/2019 1040    BOTTLES DRAWN AEROBIC AND ANAEROBIC Blood Culture results may  not be optimal due to an inadequate volume of blood received in culture bottles   CULT  03/30/2019 1040    NO GROWTH 5 DAYS Performed at [facility], [place] [address] [place], [place] [address]    REPTSTATUS 04/04/2019 FINAL 03/30/2019 1040     Radiological Exams on Admission: No results found. _______________________________________________________________________________________________________ Latest  Blood pressure 140/67, pulse 73, resp. rate 17, SpO2 100 %.   Review of Systems:    Pertinent positives include:  fatigue, melena, blood in stool  Constitutional:  No weight loss, night sweats, Fevers, chills,  weight loss  HEENT:  No headaches, Difficulty swallowing,Tooth/dental problems,Sore throat,  No sneezing, itching, ear ache, nasal congestion, post nasal drip,  Cardio-vascular:  No chest pain, Orthopnea, PND, anasarca, dizziness, palpitations.no Bilateral lower extremity swelling  GI:  No heartburn, indigestion, abdominal pain, nausea, vomiting, diarrhea, change in bowel habits, loss of appetite,, hematemesis Resp:  no shortness of breath at rest. No dyspnea on exertion, No excess mucus, no productive cough, No non-productive cough, No coughing up of blood.No change in color of mucus.No wheezing. Skin:  no rash or lesions. No jaundice GU:  no dysuria, change in color of urine, no urgency or frequency. No straining to urinate.  No flank pain.  Musculoskeletal:  No joint pain or no joint swelling. No decreased range of motion. No back pain.  Psych:  No change in mood or affect. No depression or anxiety. No memory loss.  Neuro: no localizing neurological complaints, no tingling, no weakness, no double vision, no gait abnormality, no slurred speech, no confusion  All systems reviewed and apart from [place] all are negative _______________________________________________________________________________________________ Past Medical History:   Past Medical History:   Diagnosis Date  . GI bleeding 03/2019      Past Surgical History:  Procedure Laterality Date  . COLONOSCOPY WITH PROPOFOL N/A 03/16/2019   Procedure: COLONOSCOPY WITH PROPOFOL;  Surgeon: Ronnette Juniper, MD;  Location: [place];  Service: Gastroenterology;  Laterality: N/A;  . COLONOSCOPY WITH PROPOFOL N/A 03/20/2019   Procedure: COLONOSCOPY WITH PROPOFOL;  Surgeon: Ronnette Juniper, MD;  Location: [place];  Service: Gastroenterology;  Laterality: N/A;  . COLONOSCOPY WITH PROPOFOL N/A 04/07/2019   Procedure: COLONOSCOPY WITH PROPOFOL;  Surgeon: Clarene Essex, MD;  Location: [place];  Service: Endoscopy;  Laterality: N/A;  . ESOPHAGOGASTRODUODENOSCOPY (EGD) WITH PROPOFOL N/A 03/16/2019   Procedure: ESOPHAGOGASTRODUODENOSCOPY (EGD) WITH PROPOFOL;  Surgeon: Ronnette Juniper, MD;  Location: [place];  Service:  Gastroenterology;  Laterality: N/A;  . ESOPHAGOGASTRODUODENOSCOPY (EGD) WITH PROPOFOL N/A 04/02/2019   Procedure: ESOPHAGOGASTRODUODENOSCOPY (EGD) WITH PROPOFOL;  Surgeon: Wilford Corner, MD;  Location: [place];  Service: Endoscopy;  Laterality: N/A;  . ESOPHAGOGASTRODUODENOSCOPY (EGD) WITH PROPOFOL N/A 04/12/2019   Procedure: ESOPHAGOGASTRODUODENOSCOPY (EGD) WITH PROPOFOL;  Surgeon: Clarene Essex, MD;  Location: [place];  Service: Endoscopy;  Laterality: N/A;  And possible capsule endoscopy as well  . GIVENS CAPSULE STUDY N/A 04/12/2019   Procedure: GIVENS CAPSULE STUDY;  Surgeon: Clarene Essex, MD;  Location: [place];  Service: Endoscopy;  Laterality: N/A;  . HEMOSTASIS CLIP PLACEMENT  03/20/2019   Procedure: HEMOSTASIS CLIP PLACEMENT;  Surgeon: Ronnette Juniper, MD;  Location: [facility] ENDOSCOPY;  Service: Gastroenterology;;  . HEMOSTASIS CONTROL  03/20/2019   Procedure: HEMOSTASIS CONTROL;  Surgeon: Ronnette Juniper, MD;  Location[facility]COPY;  Service: Gastroenterology;;  . POLYPECTOMY  03/16/2019   Procedure: POLYPECTOMY;  Surgeon: Ronnette Juniper, MD;  Location: [facility] ENDOSCOPY;  Service:  Gastroenterology;;  . POLYPECTOMY  03/20/2019   Procedure: POLYPECTOMY;  Surgeon: Ronnette Juniper, MD;  Location: [facility] ENDOSCOPY;  Service: Gastroenterology;;  . REPAIR OF PERFORATED ULCER  2010  ?    Social History:  Ambulatory   Independently      reports that he has been smoking cigarettes. He has been smoking about 0.25 packs per day. He has never used smokeless tobacco. He reports current alcohol use of about 2.0 standard drinks of alcohol per week. He reports current drug use. Drug: Cocaine.     Family History:   Family History  Problem Relation Age of Onset  . Diabetes Neg Hx   . Hypertension Neg Hx    ______________________________________________________________________________________________ Allergies: Allergies  Allergen Reactions  . Vicodin [Hydrocodone-Acetaminophen] Itching     Prior to Admission medications   Medication Sig Start Date End Date Taking? Authorizing Provider  ferrous sulfate 325 (65 FE) MG tablet Take 1 tablet (325 mg total) by mouth daily with breakfast. 06/02/19  Yes Matcha, Anupama, MD  Multiple Vitamin (MULTIVITAMIN WITH MINERALS) TABS tablet Take 1 tablet by mouth daily. 04/18/19  Yes Earlene Plater, MD    ___________________________________________________________________________________________________ Physical Exam: Vitals with BMI 03/12/2020 07/23/2019 06/02/2019  Height - 5\' 10"  -  Weight - 153 lbs 8 oz -  BMI - 98.33 -  Systolic 825 053 976  Diastolic 67 65 58  Pulse 73 65 91     1. General:  in No  Acute distress    Chronically ill -appearing 2. Psychological: Alert and  Oriented 3. Head/ENT:   Moist   Mucous Membranes                          Head Non traumatic, neck supple                          Poor Dentition 4. SKIN:   decreased Skin turgor,  Skin clean Dry and intact no rash 5. Heart: Regular rate and rhythm no  Murmur, no Rub or gallop 6. Lungs:  no wheezes or crackles   7. Abdomen: Soft,  non-tender,   Distended ventral  hernia present bowel sounds present 8. Lower extremities: no clubbing, cyanosis, no  edema 9. Neurologically Grossly intact, moving all 4 extremities equally   10. MSK: Normal range of motion    Chart has been reviewed  ______________________________________________________________________________________________  Assessment/Plan    65 y.o. male with medical history significant of  ETOH Cirrhosis, gastric ulcers, hx ruptured peptic ulcer   Admitted for Upper Gi bleed  Present on Admission: . Symptomatic anemia - repeat HG up to 6.6 - will transfuse 2 more units to keep up with blood loss and dilution Appreciate Gi consult in AM  . Upper GI bleed - have had hx of recurrent bleed needing repeated admissions  - [place] score  Hg <34M   melena    hepatic disease    >1 Justifies admission and aggressive management    Modifying risk factors include: hx of PUD  alcohol abuse  liver disease   Prior hx of GI bleed   Admit to stepdown given severe anemia    -  ER  Provider spoke to gastroenterology ( [facility]) they will see patient in a.m. appreciate their consult   - serial CBC.    - Monitor for any recurrence,  evidence of hemodynamic instability or significant blood loss  - Transfuse as needed for hemoglobin below 7 or evidence of life-threatening bleeding  - Establish at least 2 PIV and fluid resuscitate   - clear liquids for tonight keep nothing by mouth post midnight,   -  administer Protonix drip   -  Administer octreotide given possibility of variceal bleeding,    -   Given suspected Variceal bleed initiate antibiotics   IV Cefrtiaxone     . Hepatic cirrhosis ([place]) - encourage stopping  EtOh , GI consult    . Acute blood loss anemia - Will transfuse  . Alcohol abuse - CIWA protocol   Other plan as per orders.  DVT prophylaxis:  SCD       Code Status:    Code Status: Prior FULL CODE  as per patient  I had personally discussed CODE STATUS with patient    Family  Communication:   Family not at  Bedside    Disposition Plan:     To home once workup is complete and patient is stable    Following barriers for discharge:                            Electrolytes corrected                               Anemia corrected                            Bleeding work up                                                          Will need to be able to tolerate PO                                                        Will need consultants to evaluate patient prior to discharge  Consults called: [facility]       inpatient     I Expect 2 midnight stay secondary to severity of patient's current illness need for inpatient interventions justified by the following:    Severe lab/radiological/exam abnormalities including:    hg 3.9   and extensive comorbidities including:  substance abuse liver disease .    That are currently affecting medical management.   I expect  patient to be hospitalized for 2 midnights requiring inpatient medical care.  Patient is at high risk for adverse outcome (such as loss of life or disability) if not treated.  Indication for inpatient stay as follows:    inability to maintain oral hydration    Need for operative/procedural  intervention    Need for IV antibiotics, IV fluids, IV PPI   Level of care     SDU tele indefinitely please discontinue once patient no longer qualifies COVID-19 Labs    Lab Results  Component Value Date   [place] 05/29/2019     Precautions: admitted as asymptomatic screening protocol   PPE: Used by the provider:   P100  eye Goggles,  Gloves     Kadince Boxley 03/13/2020, 1:03 AM    [facility]     after 2 AM please page floor coverage PA If 7AM-7PM, please contact the day team taking care of the patient using [email or web address]   Patient was evaluated in the context of the global COVID-19 pandemic, which necessitated consideration  that the patient might be at risk for infection with the SARS-CoV-2 virus that causes COVID-19. Institutional protocols and algorithms that pertain to the evaluation of patients at risk for COVID-19 are in a state of rapid change based on information released by regulatory bodies including the CDC and federal and state organizations. These policies and algorithms were followed during the patient's care.

## 2020-03-13 ENCOUNTER — Other Ambulatory Visit: Payer: Self-pay

## 2020-03-13 DIAGNOSIS — K7031 Alcoholic cirrhosis of liver with ascites: Secondary | ICD-10-CM | POA: Diagnosis not present

## 2020-03-13 DIAGNOSIS — K922 Gastrointestinal hemorrhage, unspecified: Secondary | ICD-10-CM | POA: Diagnosis not present

## 2020-03-13 DIAGNOSIS — D62 Acute posthemorrhagic anemia: Secondary | ICD-10-CM | POA: Diagnosis not present

## 2020-03-13 DIAGNOSIS — D649 Anemia, unspecified: Secondary | ICD-10-CM | POA: Diagnosis not present

## 2020-03-13 LAB — CBC WITH DIFFERENTIAL/PLATELET
Abs Immature Granulocytes: 0.04 10*3/uL (ref 0.00–0.07)
Basophils Absolute: 0.1 10*3/uL (ref 0.0–0.1)
Basophils Relative: 1 %
Eosinophils Absolute: 0.6 10*3/uL — ABNORMAL HIGH (ref 0.0–0.5)
Eosinophils Relative: 9 %
HCT: 27 % — ABNORMAL LOW (ref 39.0–52.0)
Hemoglobin: 8.5 g/dL — ABNORMAL LOW (ref 13.0–17.0)
Immature Granulocytes: 1 %
Lymphocytes Relative: 16 %
Lymphs Abs: 1 10*3/uL (ref 0.7–4.0)
MCH: 29.3 pg (ref 26.0–34.0)
MCHC: 31.5 g/dL (ref 30.0–36.0)
MCV: 93.1 fL (ref 80.0–100.0)
Monocytes Absolute: 0.8 10*3/uL (ref 0.1–1.0)
Monocytes Relative: 12 %
Neutro Abs: 4 10*3/uL (ref 1.7–7.7)
Neutrophils Relative %: 61 %
Platelets: 169 10*3/uL (ref 150–400)
RBC: 2.9 MIL/uL — ABNORMAL LOW (ref 4.22–5.81)
RDW: 17.4 % — ABNORMAL HIGH (ref 11.5–15.5)
WBC: 6.5 10*3/uL (ref 4.0–10.5)
nRBC: 0.5 % — ABNORMAL HIGH (ref 0.0–0.2)

## 2020-03-13 LAB — HEMOGLOBIN AND HEMATOCRIT, BLOOD
HCT: 21.3 % — ABNORMAL LOW (ref 39.0–52.0)
Hemoglobin: 6.1 g/dL — CL (ref 13.0–17.0)

## 2020-03-13 LAB — COMPREHENSIVE METABOLIC PANEL
ALT: 32 U/L (ref 0–44)
AST: 44 U/L — ABNORMAL HIGH (ref 15–41)
Albumin: 2.7 g/dL — ABNORMAL LOW (ref 3.5–5.0)
Alkaline Phosphatase: 67 U/L (ref 38–126)
Anion gap: 7 (ref 5–15)
BUN: 11 mg/dL (ref 8–23)
CO2: 18 mmol/L — ABNORMAL LOW (ref 22–32)
Calcium: 7.6 mg/dL — ABNORMAL LOW (ref 8.9–10.3)
Chloride: 111 mmol/L (ref 98–111)
Creatinine, Ser: 1.05 mg/dL (ref 0.61–1.24)
GFR, Estimated: >60 mL/min (ref >60)
Glucose, Bld: 107 mg/dL — ABNORMAL HIGH (ref 70–99)
Potassium: 3.7 mmol/L (ref 3.5–5.1)
Sodium: 136 mmol/L (ref 135–145)
Total Bilirubin: 1.3 mg/dL — ABNORMAL HIGH (ref 0.3–1.2)
Total Protein: 5.6 g/dL — ABNORMAL LOW (ref 6.5–8.1)

## 2020-03-13 LAB — URINALYSIS, ROUTINE W REFLEX MICROSCOPIC
Bilirubin Urine: NEGATIVE
Glucose, UA: NEGATIVE mg/dL
Hgb urine dipstick: NEGATIVE
Ketones, ur: NEGATIVE mg/dL
Leukocytes,Ua: NEGATIVE
Nitrite: NEGATIVE
Protein, ur: NEGATIVE mg/dL
Specific Gravity, Urine: 1.02 (ref 1.005–1.030)
pH: 5 (ref 5.0–8.0)

## 2020-03-13 LAB — CBC
HCT: 26.7 % — ABNORMAL LOW (ref 39.0–52.0)
HCT: 28.5 % — ABNORMAL LOW (ref 39.0–52.0)
Hemoglobin: 8.5 g/dL — ABNORMAL LOW (ref 13.0–17.0)
Hemoglobin: 8.9 g/dL — ABNORMAL LOW (ref 13.0–17.0)
MCH: 29.4 pg (ref 26.0–34.0)
MCH: 29.3 pg (ref 26.0–34.0)
MCHC: 31.8 g/dL (ref 30.0–36.0)
MCHC: 31.2 g/dL (ref 30.0–36.0)
MCV: 92.4 fL (ref 80.0–100.0)
MCV: 93.8 fL (ref 80.0–100.0)
Platelets: 166 10*3/uL (ref 150–400)
Platelets: 176 10*3/uL (ref 150–400)
RBC: 2.89 MIL/uL — ABNORMAL LOW (ref 4.22–5.81)
RBC: 3.04 MIL/uL — ABNORMAL LOW (ref 4.22–5.81)
RDW: 17.8 % — ABNORMAL HIGH (ref 11.5–15.5)
RDW: 17.9 % — ABNORMAL HIGH (ref 11.5–15.5)
WBC: 7 10*3/uL (ref 4.0–10.5)
WBC: 7.8 10*3/uL (ref 4.0–10.5)
nRBC: 0.3 % — ABNORMAL HIGH (ref 0.0–0.2)
nRBC: 0.3 % — ABNORMAL HIGH (ref 0.0–0.2)

## 2020-03-13 LAB — PREPARE RBC (CROSSMATCH)

## 2020-03-13 LAB — TSH: TSH: 0.924 u[IU]/mL (ref 0.350–4.500)

## 2020-03-13 LAB — PREALBUMIN: Prealbumin: 19.9 mg/dL (ref 18–38)

## 2020-03-13 LAB — SARS CORONAVIRUS 2 (TAT 6-24 HRS): SARS Coronavirus 2: NEGATIVE

## 2020-03-13 LAB — MAGNESIUM: Magnesium: 2 mg/dL (ref 1.7–2.4)

## 2020-03-13 LAB — PHOSPHORUS: Phosphorus: 2.9 mg/dL (ref 2.5–4.6)

## 2020-03-13 LAB — MRSA PCR SCREENING: MRSA by PCR: NEGATIVE

## 2020-03-13 MED ORDER — ORAL CARE MOUTH RINSE
15.0000 mL | Freq: Two times a day (BID) | OROMUCOSAL | Status: DC
  Administered 2020-03-13 – 2020-03-14 (×3): 15 mL via OROMUCOSAL

## 2020-03-13 NOTE — Consult Note (Signed)
Referring Provider: [facility] Primary Care Physician:  [name], NP Primary Gastroenterologist: Althia Forts (was seen extensively as inpatient April/May 2021 by [facility]; subsequently had extensive hospitalization at [facility])  Reason for Consultation: Melena with severe anemia  HPI: Gavin Arroyo is a 65 y.o. male with alcoholic liver disease transferred from [facility] where he had a hemoglobin (by his report, records not available) of 3.6 and was transfused 2 units of packed cells, and admitted here last night with a hemoglobin of 6.6, as compared to 14.2 last July which is most recent available reading.  He has since received 2 additional units of packed cells.  He indicates roughly 1 to 2-day history of melenic stools with some red blood in the stool.  Progressive weakness.  He indicates that it is normal for him to have a day or 2 of dark stools every month or so, and on this occasion, the bleeding was not noticeably worse but his symptoms of weakness were much greater.  He was hospitalized repeatedly and extensively for ongoing GI blood loss between April and May of 2021, during which time he underwent 3 upper endoscopies, 3 colonoscopies, a tagged red cell scan, a video capsule endoscopy, and transfusion of 34 units of packed red cells.    Findings during that work-up included some small amount of clotted blood in the stomach on one occasion, portal gastropathy, small esophageal varices, several medium sized colonic adenomas removed, left-sided diverticulosis, and active bleeding in the mid and distal small bowel on video capsule endoscopy, which is what prompted transfer to [facility].  He was transferred to [facility] where antegrade and retrograde double-balloon enteroscopy was performed, with the following information abstracted from the discharge summary on Care Everywhere:   He transferred from OSH on 4/10. He underwent anterograde and  retrograde DBE which showed various sources of bleeding but did not show the likely source of his initial significant hemorrhage requiring > 30U transfusion. He also underwent CT GIB and Tagged RBC scan. After an episode of bleeding requiring multiple units pRBC and hypotension, admitted to NSICU on 4/20. He underwent EGD and colonoscopy on 4/21 that showed 2 ulcerations noted on colonoscopy x3 clips placed. Video capsule placed during upper endoscopy after no significant source of bleeding found. Taken off pressors on 4/22 and transferred to floor on 4/23 after Hgb stable. Discharged without evidence of recurrent bleeding after 7 das.   He underwent aDBE on 4/12 that showed a single bleeding angiectasia in the duodenum. 4/13 video capsule showed likely bleeding distal TI. 4/14 CT GI bleed protocol and tagged RBC scan without evidence of active bleed. 4/19: Underwent retrograde BP with APC of angiectasia and colon and ileum. 4/21 underwent colonoscopy that showed two ulcerations, clipped. EGD showed grade 1 esophageal varices and portal hypertensive gastropathy. 4/22 underwent video capsule with no significant source of bleeding found. At this point if he were to bleed again- per GI recommend CT -GI bleed protocol and consulting IR for suspected SMA territory bleed based on prior capsule results for small intestinal bleeding. He was observed for a total of 7 days, with no evidence of ecurrent bleeding since (last was 4/21).   The patient indicates that he was admitted briefly, for a couple of days, in May of last year, about a month following his discharge from [facility], but since then, had required no further hospitalizations until the current one.    The patient has been followed by Dr. Gunnar Bulla  Magrinat of hematology who arranged a couple of iron infusions for him last summer, but then he released the patient after his hemoglobin normalized.  The patient has a previous history of very heavy ethanol  abuse, but currently indicates he only has "1-2 beers per day."  Since admission, the patient has had a mild drop in hemoglobin to 6.1 overnight and has received 2 units of packed red cells.  The patient's liver CT scan last year, both here and at [facility], showed an indeterminate lesion.  An MRI was similarly nondefinitive.  A follow-up MRI was suggested.       Past Medical History:  Diagnosis Date  . GI bleeding 03/2019    Past Surgical History:  Procedure Laterality Date  . COLONOSCOPY WITH PROPOFOL N/A 03/16/2019   Procedure: COLONOSCOPY WITH PROPOFOL;  Surgeon: Ronnette Juniper, MD;  Location: [place];  Service: Gastroenterology;  Laterality: N/A;  . COLONOSCOPY WITH PROPOFOL N/A 03/20/2019   Procedure: COLONOSCOPY WITH PROPOFOL;  Surgeon: Ronnette Juniper, MD;  Location: [place];  Service: Gastroenterology;  Laterality: N/A;  . COLONOSCOPY WITH PROPOFOL N/A 04/07/2019   Procedure: COLONOSCOPY WITH PROPOFOL;  Surgeon: Clarene Essex, MD;  Location: [place];  Service: Endoscopy;  Laterality: N/A;  . ESOPHAGOGASTRODUODENOSCOPY (EGD) WITH PROPOFOL N/A 03/16/2019   Procedure: ESOPHAGOGASTRODUODENOSCOPY (EGD) WITH PROPOFOL;  Surgeon: Ronnette Juniper, MD;  Location: [place];  Service: Gastroenterology;  Laterality: N/A;  . ESOPHAGOGASTRODUODENOSCOPY (EGD) WITH PROPOFOL N/A 04/02/2019   Procedure: ESOPHAGOGASTRODUODENOSCOPY (EGD) WITH PROPOFOL;  Surgeon: Wilford Corner, MD;  Location: [place];  Service: Endoscopy;  Laterality: N/A;  . ESOPHAGOGASTRODUODENOSCOPY (EGD) WITH PROPOFOL N/A 04/12/2019   Procedure: ESOPHAGOGASTRODUODENOSCOPY (EGD) WITH PROPOFOL;  Surgeon: Clarene Essex, MD;  Location: [place];  Service: Endoscopy;  Laterality: N/A;  And possible capsule endoscopy as well  . GIVENS CAPSULE STUDY N/A 04/12/2019   Procedure: GIVENS CAPSULE STUDY;  Surgeon: Clarene Essex, MD;  Location: [place];  Service: Endoscopy;  Laterality: N/A;  . HEMOSTASIS CLIP PLACEMENT  03/20/2019    Procedure: HEMOSTASIS CLIP PLACEMENT;  Surgeon: Ronnette Juniper, MD;  Location: [facility] ENDOSCOPY;  Service: Gastroenterology;;  . HEMOSTASIS CONTROL  03/20/2019   Procedure: HEMOSTASIS CONTROL;  Surgeon: Ronnette Juniper, MD;  Location: [facility] ENDOSCOPY;  Service: Gastroenterology;;  . POLYPECTOMY  03/16/2019   Procedure: POLYPECTOMY;  Surgeon: Ronnette Juniper, MD;  Location: [facility] ENDOSCOPY;  Service: Gastroenterology;;  . POLYPECTOMY  03/20/2019   Procedure: POLYPECTOMY;  Surgeon: Ronnette Juniper, MD;  Location: [facility] ENDOSCOPY;  Service: Gastroenterology;;  . REPAIR OF PERFORATED ULCER  2010  ?    Prior to Admission medications   Medication Sig Start Date End Date Taking? Authorizing Provider  ferrous sulfate 325 (65 FE) MG tablet Take 1 tablet (325 mg total) by mouth daily with breakfast. 06/02/19  Yes Matcha, Anupama, MD  Multiple Vitamin (MULTIVITAMIN WITH MINERALS) TABS tablet Take 1 tablet by mouth daily. 04/18/19  Yes Earlene Plater, MD    Current Facility-Administered Medications  Medication Dose Route Frequency Provider Last Rate Last Admin  . 0.9 %  sodium chloride infusion (Manually program via Guardrails IV Fluids)   Intravenous Once Toy Baker, MD   Held at 03/12/20 2346  . acetaminophen (TYLENOL) tablet 650 mg  650 mg Oral Q6H PRN Toy Baker, MD       Or  . acetaminophen (TYLENOL) suppository 650 mg  650 mg Rectal Q6H PRN Doutova, Anastassia, MD      . cefTRIAXone (ROCEPHIN) 2 g in sodium chloride 0.9 % 100 mL IVPB  2 g Intravenous Q24H Doutova, Anastassia, MD      . Chlorhexidine Gluconate Cloth 2 % PADS 6 each  6 each Topical Daily Eugenie Filler, MD      . folic acid (FOLVITE) tablet 1 mg  1 mg Oral Daily Doutova, Anastassia, MD      . LORazepam (ATIVAN) tablet 1-4 mg  1-4 mg Oral Q1H PRN Toy Baker, MD       Or  . LORazepam (ATIVAN) injection 1-4 mg  1-4 mg Intravenous Q1H PRN Toy Baker, MD      . MEDLINE mouth rinse  15 mL Mouth Rinse BID Dana Allan I,  MD      . multivitamin with minerals tablet 1 tablet  1 tablet Oral Daily Doutova, Anastassia, MD      . octreotide (SANDOSTATIN) 500 mcg in sodium chloride 0.9 % 250 mL (2 mcg/mL) infusion  50 mcg/hr Intravenous Continuous Doutova, Anastassia, MD 25 mL/hr at 03/13/20 1029 50 mcg/hr at 03/13/20 1029  . pantoprazole (PROTONIX) 80 mg in sodium chloride 0.9 % 100 mL (0.8 mg/mL) infusion  8 mg/hr Intravenous Continuous Doutova, Anastassia, MD 10 mL/hr at 03/13/20 0800 8 mg/hr at 03/13/20 0800  . [START ON 03/16/2020] pantoprazole (PROTONIX) injection 40 mg  40 mg Intravenous Q12H Doutova, Anastassia, MD      . thiamine tablet 100 mg  100 mg Oral Daily Doutova, Anastassia, MD       Or  . thiamine (B-1) injection 100 mg  100 mg Intravenous Daily Doutova, Anastassia, MD   100 mg at 03/13/20 1056    Allergies as of 03/12/2020 - Review Complete 03/12/2020  Allergen Reaction Noted  . Vicodin [hydrocodone-acetaminophen] Itching 07/23/2019    Family History  Problem Relation Age of Onset  . Diabetes Neg Hx   . Hypertension Neg Hx     Social History   Socioeconomic History  . Marital status: Single    Spouse name: Not on file  . Number of children: Not on file  . Years of education: Not on file  . Highest education level: Not on file  Occupational History  . Not on file  Tobacco Use  . Smoking status: Current Some Day Smoker    Packs/day: 0.25    Types: Cigarettes  . Smokeless tobacco: Never Used  Vaping Use  . Vaping Use: Never used  Substance and Sexual Activity  . Alcohol use: Yes    Alcohol/week: 2.0 standard drinks    Types: 2 Cans of beer per week    Comment: 2 cans of beer daily; previous ETOH abuse  . Drug use: Yes    Types: Cocaine    Comment: every day  . Sexual activity: Not on file  Other Topics Concern  . Not on file  Social History Narrative  . Not on file   Social Determinants of Health   Financial Resource Strain: Not on file  Food Insecurity: Not on file   Transportation Needs: Not on file  Physical Activity: Not on file  Stress: Not on file  Social Connections: Not on file  Intimate Partner Violence: Not on file    Review of Systems: No localizing GI symptoms other than the dark stools.  Specifically, no heartburn, dyspepsia, nausea, poor appetite, abdominal pain.  He does admit to mild exertional chest tightness recently, no focal neurologic symptoms, no urinary symptoms, no skin rashes.  Physical Exam: Vital signs in last 24 hours: Temp:  [97.8 F (36.6 C)-99.3 F (37.4 C)] 97.9 F (  36.6 C) (03/06 0828) Pulse Rate:  [69-81] 71 (03/06 0800) Resp:  [13-22] 15 (03/06 0800) BP: (117-140)/(45-92) 127/57 (03/06 0800) SpO2:  [95 %-100 %] 98 % (03/06 0800) Weight:  [76.8 kg] 76.8 kg (03/05 2120) Last BM Date:  (PTA) General:   Alert,  Well-developed, well-nourished, pleasant and cooperative in NAD Head:  Normocephalic and atraumatic. Eyes:  Sclera clear, no icterus.   Lungs:  Clear throughout to auscultation.   No wheezes, crackles, or rhonchi. No evident respiratory distress. Heart:   Regular rate and rhythm; no murmurs, clicks, rubs,  or gallops. Abdomen: Prominent ventral hernia, only partially reducible, nontender. Rectal: The stool is fairly firm, and dark brown in color, not quite black.  It is not frankly melenic, nor does it have the odor of melena.  Certainly, no maroon or burgundy color present. Msk:   Symmetrical without gross deformities. Extremities:   Without clubbing, cyanosis, or edema. Neurologic:  Alert and coherent;  grossly normal neurologically. Skin:  Intact without significant lesions or rashes.  Multiple tattoos present. Psych:   Alert and cooperative. Normal mood and affect.  Intake/Output from previous day: 03/05 0701 - 03/06 0700 In: 1640.9 [I.V.:660.9; Blood:980] Out: 350 [Urine:350] Intake/Output this shift: Total I/O In: 330 [I.V.:330] Out: 150 [Urine:150]  Lab Results: Recent Labs     03/12/20 2208 03/13/20 0002  WBC 7.2  --   HGB 6.6* 6.1*  HCT 22.1* 21.3*  PLT 204  --    BMET Recent Labs    03/12/20 2208  NA 138  K 4.2  CL 111  CO2 14*  GLUCOSE 122*  BUN 16  CREATININE 1.07  CALCIUM 8.1*   LFT Recent Labs    03/12/20 2208  PROT 6.2*  ALBUMIN 2.9*  AST 44*  ALT 37  ALKPHOS 72  BILITOT 1.1   PT/INR Recent Labs    03/12/20 2208  LABPROT 15.0  INR 1.2    Studies/Results: No results found.  Impression: 1.  Profound anemia, recurrent, in association with dark stools 2.  History of prolonged obscure GI bleeding with multiple transfusions and extensive work-up last year, see above history for more details 3.  Alcohol abuse, improved 4.  Chronic liver disease with questionable hepatic lesion by previous CT  Plan: 1.  No evidence of active, acute GI bleeding at this time.  Accordingly, I will allow the patient a diet today and make the patient n.p.o. after midnight in case endoscopy or proximal small bowel enteroscopy is elected by the covering hospital team. 2.  Consider possible updated radiographic evaluation of the liver 3.  [facility] had suggested a CT angiogram if the patient is bleeding again.  However, at this time he does not seem to be having active enough bleeding to justify such an exam.  It appears that his recent bleeding may have been more subacute in character, and finally have led to such anemia that he could no longer tolerate it.    LOS: 1 day   [place]  03/13/2020, 10:58 AM   Pager [phone] If no answer or after 5 PM call [phone]

## 2020-03-13 NOTE — Progress Notes (Signed)
Date and time results received: 03/13/20  Critical Value: hgb 6.6  Name of Provider Notified: Doutova  Orders Received? Or Actions Taken?: 2 units of blood ordered

## 2020-03-13 NOTE — Progress Notes (Signed)
Date and time results received: 03/13/20    Critical Value: hgb 6.1  Name of Provider Notified: [facility]ad   Orders Received? Or Actions Taken? Waiting on blood bank for 2 units of blood

## 2020-03-13 NOTE — Progress Notes (Signed)
PROGRESS NOTE    Gavin Arroyo  PXT:[id number] DOB: [birth year] DOA: 03/12/2020 PCP: [name], NP  Outpatient Specialists:    Brief Narrative:  Patient is a 65 year old male with past medical history significant for alcohol abuse, alcoholic liver cirrhosis, gastric ulcers, history of ruptured peptic ulcer several years ago and recurrent GI bleed.  Patient has been worked up extensively for GI bleed over the last 1 year, including work-up at [facility].  According to the patient, he experiences dark stools monthly.  Current dark stools persisted for 2 days, with associated lightheadedness.  Hemoglobin was around 6 g/dL on presentation.  Patient has received total of 4 units of packed red blood cells.  Last hemoglobin was 8.5 g/dL.  GI team is directing patient's care.  Patient continues to use alcohol.  According to the patient, he has cut down his alcohol consumption.  Assessment & Plan:   Active Problems:   Acute blood loss anemia   Hepatic cirrhosis (HCC)   Symptomatic anemia   Upper GI bleed   Alcohol abuse  . Symptomatic anemia: -Likely secondary to GI bleed. -Patient has been transfused with oral of 4 units of packed red blood cells. -Hemoglobin has gone from 6 to 8.5 g/dL. -GI team is directing care.   [place] Kitchen Upper GI bleed: -History of recurrent GI bleed.   -Presents with another episode of GI bleed. -Further management as per GI team.   -To monitor H/H. -Transfuse packed red blood cells as needed.   . Hepatic cirrhosis ([place]): -Patient continues to drink alcohol. -Counseled to quit alcohol use.    . Acute blood loss anemia: -Secondary to GI bleed. -Status post blood transfusion (4 units of packed red blood cells).    . Alcohol abuse - CIWA protocol  DVT prophylaxis: SCD. Code Status: Full code Family Communication:  Disposition Plan: Home eventually   Consultants:   GI  Procedures:   None for now  Antimicrobials:   IV  ceftriaxone   Subjective: No new complaints. No chest pain No shortness of breath  Objective: Vitals:   03/13/20 1100 03/13/20 1200 03/13/20 1207 03/13/20 1300  BP: (!) 129/55 (!) 130/56  (!) 146/52  Pulse: 70 72  72  Resp: 15 16  16   Temp:   98.1 F (36.7 C)   TempSrc:   Oral   SpO2: 99% 96%  97%  Weight:      Height:        Intake/Output Summary (Last 24 hours) at 03/13/2020 1358 Last data filed at 03/13/2020 1300 Gross per 24 hour  Intake 1970.87 ml  Output 750 ml  Net 1220.87 ml   Filed Weights   03/12/20 2120  Weight: 76.8 kg    Examination:  General exam: Appears calm and comfortable.  Patient is pale with dry buccal mucosa. Respiratory system: Clear to auscultation.  Cardiovascular system: S1 & S2  Gastrointestinal system: Abdomen is nondistended, soft and nontender. No organomegaly or masses felt. Normal bowel sounds heard. Central nervous system: Alert and oriented.  Moves all extremities.   Extremities: No leg edema.  Data Reviewed: I have personally reviewed following labs and imaging studies  CBC: Recent Labs  Lab 03/12/20 2208 03/13/20 0002 03/13/20 1051  WBC 7.2  --  6.5  NEUTROABS  --   --  4.0  HGB 6.6* 6.1* 8.5*  HCT 22.1* 21.3* 27.0*  MCV 98.7  --  93.1  PLT 204  --  627   Basic Metabolic Panel: Recent Labs  Lab 03/12/20 2208 03/13/20 1051  NA 138 136  K 4.2 3.7  CL 111 111  CO2 14* 18*  GLUCOSE 122* 107*  BUN 16 11  CREATININE 1.07 1.05  CALCIUM 8.1* 7.6*  MG 2.1 2.0  PHOS 4.1 2.9   GFR: Estimated Creatinine Clearance: 75.3 mL/min (by C-G formula based on SCr of 1.05 mg/dL). Liver Function Tests: Recent Labs  Lab 03/12/20 2208 03/13/20 1051  AST 44* 44*  ALT 37 32  ALKPHOS 72 67  BILITOT 1.1 1.3*  PROT 6.2* 5.6*  ALBUMIN 2.9* 2.7*   No results for input(s): LIPASE, AMYLASE in the last 168 hours. Recent Labs  Lab 03/12/20 2208  AMMONIA 64*   Coagulation Profile: Recent Labs  Lab 03/12/20 2208  INR 1.2    Cardiac Enzymes: No results for input(s): CKTOTAL, CKMB, CKMBINDEX, TROPONINI in the last 168 hours. BNP (last 3 results) No results for input(s): PROBNP in the last 8760 hours. HbA1C: No results for input(s): HGBA1C in the last 72 hours. CBG: No results for input(s): GLUCAP in the last 168 hours. Lipid Profile: No results for input(s): CHOL, HDL, LDLCALC, TRIG, CHOLHDL, LDLDIRECT in the last 72 hours. Thyroid Function Tests: Recent Labs    03/13/20 1051  TSH 0.924   Anemia Panel: No results for input(s): VITAMINB12, FOLATE, FERRITIN, TIBC, IRON, RETICCTPCT in the last 72 hours. Urine analysis:    Component Value Date/Time   COLORURINE YELLOW 03/13/2020 0150   APPEARANCEUR CLEAR 03/13/2020 0150   LABSPEC 1.020 03/13/2020 0150   PHURINE 5.0 03/13/2020 0150   GLUCOSEU NEGATIVE 03/13/2020 0150   HGBUR NEGATIVE 03/13/2020 0150   BILIRUBINUR NEGATIVE 03/13/2020 0150   KETONESUR NEGATIVE 03/13/2020 0150   PROTEINUR NEGATIVE 03/13/2020 0150   NITRITE NEGATIVE 03/13/2020 0150   LEUKOCYTESUR NEGATIVE 03/13/2020 0150   Sepsis Labs: @LABRCNTIP (procalcitonin:4,lacticidven:4)  ) Recent Results (from the past 240 hour(s))  SARS CORONAVIRUS 2 (TAT 6-24 HRS) Nasopharyngeal Nasopharyngeal Swab     Status: None   Collection Time: 03/12/20  9:47 PM   Specimen: Nasopharyngeal Swab  Result Value Ref Range Status   SARS Coronavirus 2 NEGATIVE NEGATIVE Final    Comment: (NOTE) SARS-CoV-2 target nucleic acids are NOT DETECTED.  The SARS-CoV-2 RNA is generally detectable in upper and lower respiratory specimens during the acute phase of infection. Negative results do not preclude SARS-CoV-2 infection, do not rule out co-infections with other pathogens, and should not be used as the sole basis for treatment or other patient management decisions. Negative results must be combined with clinical observations, patient history, and epidemiological information. The expected result is  Negative.  Fact Sheet for Patients: SugarRoll.be  Fact Sheet for Healthcare Providers: https://www.woods-mathews.com/  This test is not yet approved or cleared by the [place] FDA and  has been authorized for detection and/or diagnosis of SARS-CoV-2 by FDA under an Emergency Use Authorization (EUA). This EUA will remain  in effect (meaning this test can be used) for the duration of the COVID-19 declaration under Se ction 564(b)(1) of the Act, 21 U.S.C. section 360bbb-3(b)(1), unless the authorization is terminated or revoked sooner.  Performed at [facility], [place] [address], [place], [place] [address]   MRSA PCR Screening     Status: None   Collection Time: 03/13/20  9:13 AM   Specimen: Nasopharyngeal  Result Value Ref Range Status   MRSA by PCR NEGATIVE NEGATIVE Final    Comment:        The GeneXpert MRSA Assay (FDA approved  for NASAL specimens only), is one component of a comprehensive MRSA colonization surveillance program. It is not intended to diagnose MRSA infection nor to guide or monitor treatment for MRSA infections. Performed at [facility], [place] [address] [place], [place] [address]          Radiology Studies: No results found.      Scheduled Meds: . sodium chloride   Intravenous Once  . Chlorhexidine Gluconate Cloth  6 each Topical Daily  . folic acid  1 mg Oral Daily  . mouth rinse  15 mL Mouth Rinse BID  . multivitamin with minerals  1 tablet Oral Daily  . [START ON 03/16/2020] pantoprazole  40 mg Intravenous Q12H  . thiamine  100 mg Oral Daily   Or  . thiamine  100 mg Intravenous Daily   Continuous Infusions: . cefTRIAXone (ROCEPHIN)  IV    . octreotide  (SANDOSTATIN)    IV infusion 50 mcg/hr (03/13/20 1029)  . pantoprozole (PROTONIX) infusion 8 mg/hr (03/13/20 0800)     LOS: 1 day    Time spent: 35 minutes.    Dana Allan, MD  [facility] Pager  #: [phone] 7PM-7AM contact night coverage as above

## 2020-03-14 ENCOUNTER — Encounter (HOSPITAL_COMMUNITY): Payer: Self-pay | Admitting: Internal Medicine

## 2020-03-14 DIAGNOSIS — D62 Acute posthemorrhagic anemia: Secondary | ICD-10-CM | POA: Diagnosis not present

## 2020-03-14 DIAGNOSIS — K7030 Alcoholic cirrhosis of liver without ascites: Secondary | ICD-10-CM

## 2020-03-14 LAB — TYPE AND SCREEN
ABO/RH(D): A POS
Antibody Screen: NEGATIVE
Unit division: 0
Unit division: 0

## 2020-03-14 LAB — BPAM RBC
Blood Product Expiration Date: 202203312359
Blood Product Expiration Date: 202204012359
ISSUE DATE / TIME: 202203060218
ISSUE DATE / TIME: 202203060428
Unit Type and Rh: 6200
Unit Type and Rh: 6200

## 2020-03-14 SURGERY — ESOPHAGOGASTRODUODENOSCOPY (EGD) WITH PROPOFOL
Anesthesia: Monitor Anesthesia Care

## 2020-03-14 MED ORDER — NADOLOL 40 MG PO TABS: 40.0000 mg | ORAL_TABLET | Freq: Every day | ORAL | 2 refills | Status: AC

## 2020-03-14 MED ORDER — PANTOPRAZOLE SODIUM 40 MG PO TBEC: 40.0000 mg | DELAYED_RELEASE_TABLET | Freq: Two times a day (BID) | ORAL | 2 refills | Status: DC

## 2020-03-14 NOTE — Progress Notes (Signed)
Patient education provided by Clarene Critchley RN. RN Ubaldo Glassing removed IVs. Patient verbalized understanding of education. Patient was taken downstairs in a wheelchair by [place] [facility] @ [phone].

## 2020-03-14 NOTE — Plan of Care (Signed)
  Problem: Education: Goal: Knowledge of General Education information will improve Description: Including pain rating scale, medication(s)/side effects and non-pharmacologic comfort measures Outcome: Progressing   Problem: Health Behavior/Discharge Planning: Goal: Ability to manage health-related needs will improve Outcome: Progressing   Problem: Clinical Measurements: Goal: Diagnostic test results will improve Outcome: Progressing   Problem: Coping: Goal: Level of anxiety will decrease Outcome: Progressing   Problem: Pain Managment: Goal: General experience of comfort will improve Outcome: Progressing   

## 2020-03-14 NOTE — Progress Notes (Signed)
Gavin Arroyo GASTROENTEROLOGY ROUNDING NOTE   Subjective: No further episodes of melena.  Hemoglobin stable s/p PRBC transfusion Patient denies any nausea, vomiting, abdominal pain, or bright red blood per rectum.  He is refusing endoscopic evaluation, states he had all the work-up and nothing was found, in the process he lost his apartment and he does not want to go through the same thing again.  He wants to go home     Objective: Vital signs in last 24 hours: Temp:  [98.1 F (36.7 C)-99.4 F (37.4 C)] 99.4 F (37.4 C) (03/07 0800) Pulse Rate:  [70-87] 87 (03/07 0900) Resp:  [14-25] 20 (03/07 0900) BP: (129-160)/(50-89) 160/89 (03/07 0900) SpO2:  [91 %-99 %] 95 % (03/07 0900) Last BM Date:  (PTA) General: NAD   Intake/Output from previous day: 03/06 0701 - 03/07 0700 In: 703.4 [I.V.:606; IV Piggyback:97.4] Out: 1700 [Urine:1700] Intake/Output this shift: Total I/O In: -  Out: 225 [Urine:225]   Lab Results: Recent Labs    03/13/20 1051 03/13/20 1553 03/13/20 2158  WBC 6.5 7.0 7.8  HGB 8.5* 8.5* 8.9*  PLT 169 166 176  MCV 93.1 92.4 93.8   BMET Recent Labs    03/12/20 2208 03/13/20 1051  NA 138 136  K 4.2 3.7  CL 111 111  CO2 14* 18*  GLUCOSE 122* 107*  BUN 16 11  CREATININE 1.07 1.05  CALCIUM 8.1* 7.6*   LFT Recent Labs    03/12/20 2208 03/13/20 1051  PROT 6.2* 5.6*  ALBUMIN 2.9* 2.7*  AST 44* 44*  ALT 37 32  ALKPHOS 72 67  BILITOT 1.1 1.3*   PT/INR Recent Labs    03/12/20 2208  INR 1.2      Imaging/Other results: No results found.    Assessment &Plan  65 year old male with history of alcohol abuse, alcoholic cirrhosis, peptic ulcer disease admitted with recurrent GI bleed/melena and worsening anemia  He had extensive GI work-up in April 2021, 3 upper endoscopies, 3 colonoscopies, a tagged red cell scan, a video capsule endoscopy, and transfusion of 34 units of packed red cells.    EGD 04/2019 showed small amount of clotted blood in  the stomach on one occasion, portal gastropathy, small esophageal varices, several medium sized colonic adenomas removed, left-sided diverticulosis, and active bleeding in the mid and distal small bowel on video capsule endoscopy, which is what prompted transfer to [facility] and underwent antegrade and retrograde double-balloon enteroscopy , CT angio, RBC tag NM scan, EGD and colonoscopy and repeat small bowel video capsule  Hemoglobin stable s/p PRBC transfusion 6--->8.9 Patient is refusing repeat EGD or any GI work-up. Discussed in detail the potential benefits and also risk for Potential life threatening bleed if he has high risk lesion that is not intervened.  He just wants to go home and is refusing any inpatient work up  Currently on octreotide and PPI gtt  Transition to low dose non selective Bet blocker Propranolol BID or Nadolol daily for variceal prophylaxis  and Protonix 40mg  twice daily  Alcoholic cirrhosis: Meld 10 Ongoing EtOH use, on CIWA protocol. Will need to discuss alcohol cessation On ceftriaxone for SBP prophylaxis  GI available if needed, please call with any questions    K. Denzil Magnuson , MD [phone]  [facility] Gastroenterology

## 2020-03-14 NOTE — Discharge Summary (Signed)
Physician Discharge Summary  Gavin Arroyo:[id number] DOB: [birth year] DOA: 03/12/2020  PCP: [name], NP  Admit date: 03/12/2020 Discharge date: 03/14/2020  Admitted From: Home Discharge disposition: Home   Code Status: Full Code  Diet Recommendation: Low-salt diet  Discharge Diagnosis:   Active Problems:   Acute blood loss anemia   Hepatic cirrhosis ([place])   Symptomatic anemia   Upper GI bleed   Alcohol abuse  History of Present Illness / Brief narrative:  Patient is a 65 year old male with PMH of chronic alcohol abuse, currently drinks about 2 beers a day, history of alcoholic liver cirrhosis, gastric ulcers, history of ruptured peptic ulcer several years ago and recurrent GI bleed.  Patient has been worked up extensively for GI bleed over the last 1 year, including work-up at [facility].  According to the patient, he experiences dark stools monthly.   3/5, patient presented to the ED with complaint of dark stool for 2 days associated with lightheadedness. Hemoglobin was around 6 g/dL on presentation.  Admit to hospitalist service. GI consultation was obtained.  Subjective:  Seen and examined this morning. Lying in bed.  Not in distress.  Does not want any intervention done.  Wants to go home.  Hospital Course:  Acute on chronic blood loss anemia Acute on chronic GI bleeding -Presented with dark stool for 2 days associated with lightheadedness. -Hemoglobin on admission was low at 6. Received total of 4 units of packed red blood cells with improvement in hemoglobin to 8.9. -GI service, patient had an extensive GI work-up in April 2021, 3 upper endoscopies, 3 colonoscopies, a tagged red cell scan, a video capsule endoscopy, andtransfusion of 34 units of packed redcells.  -EGD 04/2019 showed small amount of clotted blood in the stomach on one occasion, portal gastropathy, small esophageal varices, several medium sized colonic adenomas removed, left-sided  diverticulosis, andactive bleeding in the mid and distal small bowel on video capsule endoscopy,which is what prompted transfer to [facility] and underwent antegrade and retrograde double-balloon enteroscopy , CT angio, RBC tag NM scan, EGD and colonoscopy and repeat small bowel video capsule.  Patient at this time is refusing repeat EGD or any GI work-up. Discussed in detail the potential benefits and also risk for Potential life threatening bleed if he has high risk lesion that is not intervened.  He just wants to go home and is refusing any inpatient work up  Patient is currently on octreotide and PPI gtt  Hepatic cirrhosis Chronic alcohol use -Continues to drink 2-3 beers a day.  States he is cutting down. -As per GI recommendation, we will discharge the patient on nadolol daily for variceal prophylaxis  and Protonix 40mg  twice daily.  Stable for discharge to home today.  Wound care:    Discharge Exam:   Vitals:   03/14/20 0900 03/14/20 1000 03/14/20 1100 03/14/20 1200  BP: (!) 160/89 130/62 (!) 127/47 (!) 134/50  Pulse: 87 74 72 72  Resp: 20 15 13 12   Temp:    98.3 F (36.8 C)  TempSrc:    Oral  SpO2: 95% 93% 90% 95%  Weight:      Height:        Body mass index is 24.29 kg/m.  General exam: Pleasant, middle-aged Caucasian male.  Not in distress Skin: No rashes, lesions or ulcers. HEENT: Atraumatic, normocephalic, no obvious bleeding Lungs: Clear to auscultation bilaterally CVS: Regular rate and rhythm, no murmur GI/Abd soft, nontender, nondistended, bowel sound present CNS: Alert,  awake, oriented x3 Psychiatry: Mood appropriate Extremities: No pedal edema, no calf tenderness  Follow ups:   Discharge Instructions    Increase activity slowly   Complete by: As directed       Follow-up Information    [name], NP Follow up.   Contact information: [place]  [address] [phone]        Ronald Lobo, MD Follow up.    Specialty: Gastroenterology Contact information: [address] [place] [address] [phone]               Recommendations for Outpatient Follow-Up:   1. Follow-up with PCP as an outpatient 2. Follow-up with GI as an outpatient  Discharge Instructions:  Follow with Primary MD [name], NP in 7 days   Get CBC/BMP checked in next visit within 1 week by PCP or SNF MD ( we routinely change or add medications that can affect your baseline labs and fluid status, therefore we recommend that you get the mentioned basic workup next visit with your PCP, your PCP may decide not to get them or add new tests based on their clinical decision)  On your next visit with your PCP, please Get Medicines reviewed and adjusted.  Please request your PCP  to go over all Hospital Tests and Procedure/Radiological results at the follow up, please get all Hospital records sent to your Prim MD by signing hospital release before you go home.  Activity: As tolerated with Full fall precautions use walker/cane & assistance as needed  For Heart failure patients - Check your Weight same time everyday, if you gain over 2 pounds, or you develop in leg swelling, experience more shortness of breath or chest pain, call your Primary MD immediately. Follow Cardiac Low Salt Diet and 1.5 lit/day fluid restriction.  If you have smoked or chewed Tobacco in the last 2 yrs please stop smoking, stop any regular Alcohol  and or any Recreational drug use.  If you experience worsening of your admission symptoms, develop shortness of breath, life threatening emergency, suicidal or homicidal thoughts you must seek medical attention immediately by calling 911 or calling your MD immediately  if symptoms less severe.  You Must read complete instructions/literature along with all the possible adverse reactions/side effects for all the Medicines you take and that have been prescribed to you. Take any new  Medicines after you have completely understood and accpet all the possible adverse reactions/side effects.   Do not drive, operate heavy machinery, perform activities at heights, swimming or participation in water activities or provide baby sitting services if your were admitted for syncope or siezures until you have seen by Primary MD or a Neurologist and advised to do so again.  Do not drive when taking Pain medications.  Do not take more than prescribed Pain, Sleep and Anxiety Medications  Wear Seat belts while driving.   Please note You were cared for by a hospitalist during your hospital stay. If you have any questions about your discharge medications or the care you received while you were in the hospital after you are discharged, you can call the unit and asked to speak with the hospitalist on call if the hospitalist that took care of you is not available. Once you are discharged, your primary care physician will handle any further medical issues. Please note that NO REFILLS for any discharge medications will be authorized once you are discharged, as it is imperative that you return  to your primary care physician (or establish a relationship with a primary care physician if you do not have one) for your aftercare needs so that they can reassess your need for medications and monitor your lab values.    Allergies as of 03/14/2020      Reactions   Vicodin [hydrocodone-acetaminophen] Itching      Medication List    TAKE these medications   ferrous sulfate 325 (65 FE) MG tablet Take 1 tablet (325 mg total) by mouth daily with breakfast.   multivitamin with minerals Tabs tablet Take 1 tablet by mouth daily.   nadolol 40 MG tablet Commonly known as: Corgard Take 1 tablet (40 mg total) by mouth daily.   pantoprazole 40 MG tablet Commonly known as: Protonix Take 1 tablet (40 mg total) by mouth 2 (two) times daily.       Time coordinating discharge: 35 minutes  The results of  significant diagnostics from this hospitalization (including imaging, microbiology, ancillary and laboratory) are listed below for reference.    Procedures and Diagnostic Studies:   No results found.   Labs:   Basic Metabolic Panel: Recent Labs  Lab 03/12/20 2208 03/13/20 1051  NA 138 136  K 4.2 3.7  CL 111 111  CO2 14* 18*  GLUCOSE 122* 107*  BUN 16 11  CREATININE 1.07 1.05  CALCIUM 8.1* 7.6*  MG 2.1 2.0  PHOS 4.1 2.9   GFR Estimated Creatinine Clearance: 75.3 mL/min (by C-G formula based on SCr of 1.05 mg/dL). Liver Function Tests: Recent Labs  Lab 03/12/20 2208 03/13/20 1051  AST 44* 44*  ALT 37 32  ALKPHOS 72 67  BILITOT 1.1 1.3*  PROT 6.2* 5.6*  ALBUMIN 2.9* 2.7*   No results for input(s): LIPASE, AMYLASE in the last 168 hours. Recent Labs  Lab 03/12/20 2208  AMMONIA 64*   Coagulation profile Recent Labs  Lab 03/12/20 2208  INR 1.2    CBC: Recent Labs  Lab 03/12/20 2208 03/13/20 0002 03/13/20 1051 03/13/20 1553 03/13/20 2158  WBC 7.2  --  6.5 7.0 7.8  NEUTROABS  --   --  4.0  --   --   HGB 6.6* 6.1* 8.5* 8.5* 8.9*  HCT 22.1* 21.3* 27.0* 26.7* 28.5*  MCV 98.7  --  93.1 92.4 93.8  PLT 204  --  169 166 176   Cardiac Enzymes: No results for input(s): CKTOTAL, CKMB, CKMBINDEX, TROPONINI in the last 168 hours. BNP: Invalid input(s): POCBNP CBG: No results for input(s): GLUCAP in the last 168 hours. D-Dimer No results for input(s): DDIMER in the last 72 hours. Hgb A1c No results for input(s): HGBA1C in the last 72 hours. Lipid Profile No results for input(s): CHOL, HDL, LDLCALC, TRIG, CHOLHDL, LDLDIRECT in the last 72 hours. Thyroid function studies Recent Labs    03/13/20 1051  TSH 0.924   Anemia work up No results for input(s): VITAMINB12, FOLATE, FERRITIN, TIBC, IRON, RETICCTPCT in the last 72 hours. Microbiology Recent Results (from the past 240 hour(s))  SARS CORONAVIRUS 2 (TAT 6-24 HRS) Nasopharyngeal Nasopharyngeal Swab      Status: None   Collection Time: 03/12/20  9:47 PM   Specimen: Nasopharyngeal Swab  Result Value Ref Range Status   SARS Coronavirus 2 NEGATIVE NEGATIVE Final    Comment: (NOTE) SARS-CoV-2 target nucleic acids are NOT DETECTED.  The SARS-CoV-2 RNA is generally detectable in upper and lower respiratory specimens during the acute phase of infection. Negative results do not preclude SARS-CoV-2 infection, do  not rule out co-infections with other pathogens, and should not be used as the sole basis for treatment or other patient management decisions. Negative results must be combined with clinical observations, patient history, and epidemiological information. The expected result is Negative.  Fact Sheet for Patients: SugarRoll.be  Fact Sheet for Healthcare Providers: https://www.woods-mathews.com/  This test is not yet approved or cleared by the [place] FDA and  has been authorized for detection and/or diagnosis of SARS-CoV-2 by FDA under an Emergency Use Authorization (EUA). This EUA will remain  in effect (meaning this test can be used) for the duration of the COVID-19 declaration under Se ction 564(b)(1) of the Act, 21 U.S.C. section 360bbb-3(b)(1), unless the authorization is terminated or revoked sooner.  Performed at [facility], [place] [address] [place], [place] [address]   MRSA PCR Screening     Status: None   Collection Time: 03/13/20  9:13 AM   Specimen: Nasopharyngeal  Result Value Ref Range Status   MRSA by PCR NEGATIVE NEGATIVE Final    Comment:        The GeneXpert MRSA Assay (FDA approved for NASAL specimens only), is one component of a comprehensive MRSA colonization surveillance program. It is not intended to diagnose MRSA infection nor to guide or monitor treatment for MRSA infections. Performed at [facility], [place] [address], [place],  [address]      Signed: Marlowe Aschoff  Jaquana Geiger  [facility] 03/14/2020, 1:54 PM

## 2020-03-14 NOTE — TOC Initial Note (Signed)
Transition of Care [facility]) - Initial/Assessment Note    Patient Details  Name: Gavin Arroyo MRN: [id number] Date of Birth: 12/28/1955  Transition of Care [facility]) CM/SW Contact:    Leeroy Cha, RN Phone Number: 03/14/2020, 9:36 AM  Clinical Narrative:                 65 y.o. male with alcoholic liver disease transferred from [facility] where he had a hemoglobin (by his report, records not available) of 3.6 and was transfused 2 units of packed cells, and admitted here last night with a hemoglobin of 6.6, as compared to 14.2 last July which is most recent available reading.  He has since received 2 additional units of packed cells.  He indicates roughly 1 to 2-day history of melenic stools with some red blood in the stool.  Progressive weakness.  He indicates that it is normal for him to have a day or 2 of dark stools every month or so, and on this occasion, the bleeding was not noticeably worse but his symptoms of weakness were much greater.  He was hospitalized repeatedly and extensively for ongoing GI blood loss between April and May of 2021, during which time he underwent 3 upper endoscopies, 3 colonoscopies, a tagged red cell scan, a video capsule endoscopy, and transfusion of 34 units of packed red cells.    Findings during that work-up included some small amount of clotted blood in the stomach on one occasion, portal gastropathy, small esophageal varices, several medium sized colonic adenomas removed, left-sided diverticulosis, and active bleeding in the mid and distal small bowel on video capsule endoscopy, which is what prompted transfer to [facility].  He was transferred to [facility] where antegrade and retrograde double-balloon enteroscopy was performed, with the following information abstracted from the discharge summary on Care Everywhere:  [place].  WILL NEED SUBSTANCE ABUSE INFORMATION.  Expected Discharge Plan: Home/Self  Care Barriers to Discharge: Continued Medical Work up   Patient Goals and CMS Choice Patient states their goals for this hospitalization and ongoing recovery are:: to go home ands get well CMS Medicare.gov Compare Post Acute Care list provided to:: Patient    Expected Discharge Plan and Services Expected Discharge Plan: Home/Self Care   Discharge Planning Services: CM Consult   Living arrangements for the past 2 months: Single Family Home                                      Prior Living Arrangements/Services Living arrangements for the past 2 months: Single Family Home Lives with:: Self Patient language and need for interpreter reviewed:: Yes Do you feel safe going back to the place where you live?: Yes      Need for Family Participation in Patient Care: No (Comment) Care giver support system in place?: No (comment)   Criminal Activity/Legal Involvement Pertinent to Current Situation/Hospitalization: No - Comment as needed  Activities of Daily Living Home Assistive Devices/Equipment: None ADL Screening (condition at time of admission) Patient's cognitive ability adequate to safely complete daily activities?: Yes Is the patient deaf or have difficulty hearing?: No Does the patient have difficulty seeing, even when wearing glasses/contacts?: No Does the patient have difficulty concentrating, remembering, or making decisions?: No Patient able to express need for assistance with ADLs?: Yes Does the patient have difficulty dressing or bathing?: No Independently performs ADLs?: Yes (appropriate  for developmental age) Does the patient have difficulty walking or climbing stairs?: No Weakness of Legs: None Weakness of Arms/Hands: None  Permission Sought/Granted                  Emotional Assessment Appearance:: Appears stated age Attitude/Demeanor/Rapport: Engaged Affect (typically observed): Calm Orientation: : Oriented to Self,Oriented to Place,Oriented to   Time,Oriented to Situation Alcohol / Substance Use: Alcohol Use,Tobacco Use Psych Involvement: No (comment)  Admission diagnosis:  Symptomatic anemia [D64.9] Patient Active Problem List   Diagnosis Date Noted  . Symptomatic anemia 03/12/2020  . Upper GI bleed 03/12/2020  . Alcohol abuse 03/12/2020  . Iron deficiency anemia 06/06/2019  . GIB (gastrointestinal bleeding) 05/30/2019  . Hepatic encephalopathy ([place]) 03/30/2019  . Malnutrition of moderate degree 03/28/2019  . Hepatic cirrhosis ([place]) 03/24/2019  . Acute GI bleeding 03/20/2019  . Acute blood loss anemia 03/19/2019  . Anemia   . NAGMA 03/14/2019  . Alcohol use disorder, moderate, dependence ([place]) 03/14/2019   PCP:  [name], NP Pharmacy:   [facility] [address] [place] [address] Phone: [phone] Fax: [phone]     Social Determinants of Health (SDOH) Interventions    Readmission Risk Interventions Readmission Risk Prevention Plan 06/02/2019  Transportation Screening Complete  Medication Review Press photographer) Complete  PCP or Specialist appointment within 3-5 days of discharge (No Data)  [place] Not Applicable  Some recent data might be hidden

## 2020-10-09 ENCOUNTER — Other Ambulatory Visit: Payer: Self-pay

## 2020-10-09 ENCOUNTER — Observation Stay (HOSPITAL_COMMUNITY): Payer: Medicaid Other

## 2020-10-09 ENCOUNTER — Inpatient Hospital Stay (HOSPITAL_COMMUNITY)
Admission: EM | Admit: 2020-10-09 | Discharge: 2020-10-11 | DRG: 378 | Disposition: A | Discharge status: Home / Self Care | Payer: Medicaid Other | Attending: Family Medicine | Admitting: Family Medicine

## 2020-10-09 DIAGNOSIS — F1010 Alcohol abuse, uncomplicated: Secondary | ICD-10-CM | POA: Diagnosis not present

## 2020-10-09 DIAGNOSIS — D500 Iron deficiency anemia secondary to blood loss (chronic): Secondary | ICD-10-CM

## 2020-10-09 DIAGNOSIS — K766 Portal hypertension: Secondary | ICD-10-CM

## 2020-10-09 DIAGNOSIS — K921 Melena: Secondary | ICD-10-CM | POA: Diagnosis not present

## 2020-10-09 DIAGNOSIS — Z885 Allergy status to narcotic agent status: Secondary | ICD-10-CM

## 2020-10-09 DIAGNOSIS — F17210 Nicotine dependence, cigarettes, uncomplicated: Secondary | ICD-10-CM | POA: Diagnosis present

## 2020-10-09 DIAGNOSIS — K317 Polyp of stomach and duodenum: Secondary | ICD-10-CM | POA: Diagnosis present

## 2020-10-09 DIAGNOSIS — I8510 Secondary esophageal varices without bleeding: Secondary | ICD-10-CM | POA: Diagnosis present

## 2020-10-09 DIAGNOSIS — Z79899 Other long term (current) drug therapy: Secondary | ICD-10-CM

## 2020-10-09 DIAGNOSIS — K7030 Alcoholic cirrhosis of liver without ascites: Secondary | ICD-10-CM

## 2020-10-09 DIAGNOSIS — K439 Ventral hernia without obstruction or gangrene: Secondary | ICD-10-CM | POA: Diagnosis present

## 2020-10-09 DIAGNOSIS — D62 Acute posthemorrhagic anemia: Secondary | ICD-10-CM | POA: Diagnosis not present

## 2020-10-09 DIAGNOSIS — K922 Gastrointestinal hemorrhage, unspecified: Secondary | ICD-10-CM | POA: Diagnosis present

## 2020-10-09 DIAGNOSIS — K5521 Angiodysplasia of colon with hemorrhage: Secondary | ICD-10-CM

## 2020-10-09 DIAGNOSIS — Z20822 Contact with and (suspected) exposure to covid-19: Secondary | ICD-10-CM | POA: Diagnosis present

## 2020-10-09 DIAGNOSIS — K3189 Other diseases of stomach and duodenum: Secondary | ICD-10-CM

## 2020-10-09 DIAGNOSIS — K7460 Unspecified cirrhosis of liver: Secondary | ICD-10-CM | POA: Diagnosis present

## 2020-10-09 DIAGNOSIS — I868 Varicose veins of other specified sites: Secondary | ICD-10-CM

## 2020-10-09 DIAGNOSIS — K31811 Angiodysplasia of stomach and duodenum with bleeding: Principal | ICD-10-CM | POA: Diagnosis present

## 2020-10-09 LAB — COMPREHENSIVE METABOLIC PANEL
ALT: 54 U/L — ABNORMAL HIGH (ref 0–44)
AST: 100 U/L — ABNORMAL HIGH (ref 15–41)
Albumin: 2.4 g/dL — ABNORMAL LOW (ref 3.5–5.0)
Alkaline Phosphatase: 107 U/L (ref 38–126)
Anion gap: 11 (ref 5–15)
BUN: 11 mg/dL (ref 8–23)
CO2: 16 mmol/L — ABNORMAL LOW (ref 22–32)
Calcium: 8 mg/dL — ABNORMAL LOW (ref 8.9–10.3)
Chloride: 113 mmol/L — ABNORMAL HIGH (ref 98–111)
Creatinine, Ser: 0.92 mg/dL (ref 0.61–1.24)
GFR, Estimated: >60 mL/min (ref >60)
Glucose, Bld: 125 mg/dL — ABNORMAL HIGH (ref 70–99)
Potassium: 3.7 mmol/L (ref 3.5–5.1)
Sodium: 140 mmol/L (ref 135–145)
Total Bilirubin: 0.5 mg/dL (ref 0.3–1.2)
Total Protein: 5.4 g/dL — ABNORMAL LOW (ref 6.5–8.1)

## 2020-10-09 LAB — CBC
HCT: 22.7 % — ABNORMAL LOW (ref 39.0–52.0)
Hemoglobin: 6.9 g/dL — CL (ref 13.0–17.0)
MCH: 28.5 pg (ref 26.0–34.0)
MCHC: 30.4 g/dL (ref 30.0–36.0)
MCV: 93.8 fL (ref 80.0–100.0)
Platelets: 157 10*3/uL (ref 150–400)
RBC: 2.42 MIL/uL — ABNORMAL LOW (ref 4.22–5.81)
RDW: 20.1 % — ABNORMAL HIGH (ref 11.5–15.5)
WBC: 6.6 10*3/uL (ref 4.0–10.5)
nRBC: 0.3 % — ABNORMAL HIGH (ref 0.0–0.2)

## 2020-10-09 LAB — PROTIME-INR
INR: 1.3 — ABNORMAL HIGH (ref 0.8–1.2)
Prothrombin Time: 16.5 seconds — ABNORMAL HIGH (ref 11.4–15.2)

## 2020-10-09 LAB — PREPARE RBC (CROSSMATCH)

## 2020-10-09 LAB — RESP PANEL BY RT-PCR (FLU A&B, COVID) ARPGX2
Influenza A by PCR: NEGATIVE
Influenza B by PCR: NEGATIVE
SARS Coronavirus 2 by RT PCR: NEGATIVE

## 2020-10-09 LAB — HEMOGLOBIN AND HEMATOCRIT, BLOOD
HCT: 24.5 % — ABNORMAL LOW (ref 39.0–52.0)
Hemoglobin: 7.5 g/dL — ABNORMAL LOW (ref 13.0–17.0)

## 2020-10-09 MED ORDER — SODIUM CHLORIDE 0.9 % IV SOLN
INTRAVENOUS | Status: AC
Start: 2020-10-09 — End: 2020-10-10

## 2020-10-09 MED ORDER — ADULT MULTIVITAMIN W/MINERALS CH
1.0000 | ORAL_TABLET | Freq: Every day | ORAL | Status: DC
  Administered 2020-10-09 – 2020-10-11 (×2): 1 via ORAL
  Filled 2020-10-09 (×2): qty 1

## 2020-10-09 MED ORDER — PANTOPRAZOLE INFUSION (NEW) - SIMPLE MED
8.0000 mg/h | INTRAVENOUS | Status: DC
  Administered 2020-10-09 – 2020-10-11 (×4): 8 mg/h via INTRAVENOUS
  Filled 2020-10-09: qty 100
  Filled 2020-10-09 (×4): qty 80
  Filled 2020-10-09: qty 100

## 2020-10-09 MED ORDER — LORAZEPAM 2 MG/ML IJ SOLN: 1.0000 mg | INTRAMUSCULAR | Status: DC | PRN

## 2020-10-09 MED ORDER — LORAZEPAM 1 MG PO TABS: 1.0000 mg | ORAL_TABLET | ORAL | Status: DC | PRN

## 2020-10-09 MED ORDER — FOLIC ACID 1 MG PO TABS
1.0000 mg | ORAL_TABLET | Freq: Every day | ORAL | Status: DC
  Administered 2020-10-09 – 2020-10-11 (×2): 1 mg via ORAL
  Filled 2020-10-09 (×2): qty 1

## 2020-10-09 MED ORDER — SODIUM CHLORIDE 0.9 % IV SOLN
50.0000 ug/h | INTRAVENOUS | Status: DC
  Administered 2020-10-09 – 2020-10-10 (×3): 50 ug/h via INTRAVENOUS
  Filled 2020-10-09 (×7): qty 1

## 2020-10-09 MED ORDER — ACETAMINOPHEN 650 MG RE SUPP: 650.0000 mg | Freq: Four times a day (QID) | RECTAL | Status: DC | PRN

## 2020-10-09 MED ORDER — SODIUM CHLORIDE 0.9 % IV SOLN
10.0000 mL/h | Freq: Once | INTRAVENOUS | Status: AC
  Administered 2020-10-09: 10 mL/h via INTRAVENOUS

## 2020-10-09 MED ORDER — ONDANSETRON HCL 4 MG PO TABS: 4.0000 mg | ORAL_TABLET | Freq: Four times a day (QID) | ORAL | Status: DC | PRN

## 2020-10-09 MED ORDER — PANTOPRAZOLE 80MG IVPB - SIMPLE MED
80.0000 mg | Freq: Once | INTRAVENOUS | Status: AC
  Administered 2020-10-09: 80 mg via INTRAVENOUS
  Filled 2020-10-09: qty 80

## 2020-10-09 MED ORDER — ONDANSETRON HCL 4 MG/2ML IJ SOLN: 4.0000 mg | Freq: Four times a day (QID) | INTRAMUSCULAR | Status: DC | PRN

## 2020-10-09 MED ORDER — PANTOPRAZOLE SODIUM 40 MG IV SOLR: 40.0000 mg | Freq: Two times a day (BID) | INTRAVENOUS | Status: DC

## 2020-10-09 MED ORDER — THIAMINE HCL 100 MG/ML IJ SOLN: 100.0000 mg | Freq: Every day | INTRAMUSCULAR | Status: DC

## 2020-10-09 MED ORDER — THIAMINE HCL 100 MG PO TABS
100.0000 mg | ORAL_TABLET | Freq: Every day | ORAL | Status: DC
  Administered 2020-10-09 – 2020-10-11 (×2): 100 mg via ORAL
  Filled 2020-10-09 (×2): qty 1

## 2020-10-09 MED ORDER — GADOBUTROL 1 MMOL/ML IV SOLN
7.0000 mL | Freq: Once | INTRAVENOUS | Status: AC | PRN
  Administered 2020-10-09: 7 mL via INTRAVENOUS

## 2020-10-09 MED ORDER — ACETAMINOPHEN 325 MG PO TABS
650.0000 mg | ORAL_TABLET | Freq: Four times a day (QID) | ORAL | Status: DC | PRN
  Filled 2020-10-09: qty 2

## 2020-10-09 NOTE — H&P (View-Only) (Signed)
Referring Provider:  ? EDP Primary Care Physician:  [name], NP Primary Gastroenterologist:  Althia Forts  Reason for Consultation:  Anemia and melena, heme positive  HPI: Gavin Arroyo is a 65 y.o. male with PMH listed below who presents to [facility] hospital with complaints of bloody/black stools over the past 3 days.  Associate symptoms included SOB/DOE, intermittent blurry vision, generalized weakness and fatigue.  No abdominal pain, nausea, vomiting.  He says that he will have bleeding periodically but it sometimes will last a day or so and he just stays at home but this time it persisted so he came to the ED for evaluation.  Labs showed WBC 6.6, Hgb 6.9 grams, PLT 157. Per ED provider FOBT positive. Patient was started on Protonix IV gtt and octreotide gtt and received one unit PRBC transfusion at the ED.  INR pending.  Platelets 157.  LFTs with AST 100 and ALT 54 but total bili and ALP normal.  He has history of massive GI bleed in March/April 2021 that was evaluated here and then he was transferred to [facility] where he also underwent extensive evaluation.  That is summarized below.    More recently in March of this year he went to [facility] where they transfused him with 2 units of packed red blood cells and then transferred him here to [facility] long hospital.  There he received 2 more units of packed red blood cells, but no GI evaluation at that time.  Then in May of this year he presented to [place] at [facility] where he underwent EGD with DR. Badreddine as follows:  Trace esophageal varices in the lower esophagus with no signs of bleeding  at all.  Changes of mild PHG in the proximal stomach.  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  APC was used and ablation performed with excellent hemostasis.  Mild amount of solid food seen in the gastric body.  No gastric varices seen.  The first and second part of the duodenum appeared normal.    March/April 2021 events summarized as follows:  "Patient first presented with acute encephalopathy (resolved at transfer) and persistent GI bleed on 03/14/2019 with hemoglobin of 5.0 grams. Was discharged twice, and then readmitted within 12 hours for the same presentation. Per documentation, has had both melenic and bright red bloody stools during his hospital stay. Work-up at the OSH has been notable for an EGD with evidence of hematin, nonbleeding grade 1 esophageal varices and portal hypertensive gastropathy. Also had a colonoscopy with nonbleeding diverticula, 3 polyps in the transverse colon, 2 in the ascending colon and one in the sigmoid colon all of which were removed. Repeat colonoscopy was notable for ulcerations at the site of polypectomies. Repeat EGD showed oozing from portal hypertensive gastropathy and again small nonbleeding varices. Had a tagged RBC with no evident bleeding. Lastly, had a repeat EGD with capsule deployed that revealed bleeding in the middle and distal part of small bowel. Per discussion between GI team, decision was made to immediately transferred to [facility] for double-balloon enteroscopy. In total, he has received 34 units of blood over the past month of March prior to transfer. Also received Protonix 80 --> 40 BID, sucralfate and octreotide on discharge.  He transferred from OSH on 4/10. He underwent anterograde and retrograde DBE which showed various sources of bleeding but did not show the likely source of his initial significant hemorrhage requiring > 30U transfusion. He  also underwent CT GIB and Tagged RBC scan. After an episode of bleeding requiring multiple units pRBC and hypotension, admitted to NSICU on 4/20. He underwent EGD and colonoscopy on 4/21 that showed 2 ulcerations noted on colonoscopy x3 clips placed. Video capsule placed during upper endoscopy after no significant source of bleeding found. Taken off pressors on 4/22 and transferred to floor on 4/23 after  Hgb stable. Discharged without evidence of recurrent bleeding after 7 days.   For any recurrent bleeding then recommend CT -GI bleed protocol and consulting IR for suspected SMA territory bleed based on prior capsule results for small intestinal bleeding.   He was discharged from [facility] on 05/04/2020.  GI procedures at [facility]:  Capsule Endoscopy 04/29/19 Impression: only fresh blood seen at site capsule placed and a little distale, rest of small bowel no blood. previous clip placed seen  Upper Endoscopy 04/29/2019  - Grade I esophageal varices. - Widely patent Schatzki ring. - Portal hypertensive gastropathy. - Mass (suspected adenoma) in the duodenal bulb. No specimens collected.  Colonoscopy 04/29/2019  - Mucosal ulceration. Clips (MR conditional) were placed. - One polyp in the transverse colon with ulceration. Clip (MR conditional) was placed. - Blood in the terminal ileum.  Anterior Double Balloon Enteroscopy 04/27/19 - Diverticulosis in the left colon. Prep of colon was not adequate for colon cancer screening purposes. - A single colonic angioectasia. Treated with argon plasma coagulation (APC). - The examined portion of the ileum was normal. - The examined portion of the ileum was normal. - A single angioectasia in the ileum. Treated with argon plasma coagulation (APC). - A tattoo was seen in the ileum. The tattoo site marks the depth of maximum advancement of the scope on a previous exam (recent antegrade DBE). - No specimens collected.  Last GI procedures at [facility] hospital before transfer to [facility]:  Video capsule endoscopy April 14, 2019 showed bleeding in the mid to distal small bowel.  EGD April 04/12/2019: -Barely grade I esophageal varices. - Portal hypertensive gastropathy. Mild antritis as well - Normal duodenal bulb, first portion of the duodenum, second portion of the duodenum and third portion of the duodenum.  Colonoscopy with Dr. Watt Climes April 07, 2019 showed poor prep, but  blood in the entire colon.  No active bleeding seen.  During all of that evaluation he was also diagnosed with cirrhosis, secondary to alcohol.  He was drinking a lot of hard liquor at that time.  Obviously as stated in procedures he has had trace esophageal varices.  He is on nadolol.  He also had some ascites at that time and was on some diuretics, but he has had no recurrent issues with that and is not on diuretics at this time.  He had an MRI showing some indeterminate liver lesions for which they recommended follow-up MRI at 12-month interval has not been performed.  He continues to drink alcohol about 2-4 beers daily.   Past Medical History:  Diagnosis Date   GI bleeding 03/2019    Past Surgical History:  Procedure Laterality Date   COLONOSCOPY WITH PROPOFOL N/A 03/16/2019   Procedure: COLONOSCOPY WITH PROPOFOL;  Surgeon: Ronnette Juniper, MD;  Location: [place];  Service: Gastroenterology;  Laterality: N/A;   COLONOSCOPY WITH PROPOFOL N/A 03/20/2019   Procedure: COLONOSCOPY WITH PROPOFOL;  Surgeon: Ronnette Juniper, MD;  Location: [place];  Service: Gastroenterology;  Laterality: N/A;   COLONOSCOPY WITH PROPOFOL N/A 04/07/2019   Procedure: COLONOSCOPY WITH PROPOFOL;  Surgeon: Clarene Essex, MD;  Location:  [place] [facility];  Service: Endoscopy;  Laterality: N/A;   ESOPHAGOGASTRODUODENOSCOPY (EGD) WITH PROPOFOL N/A 03/16/2019   Procedure: ESOPHAGOGASTRODUODENOSCOPY (EGD) WITH PROPOFOL;  Surgeon: Ronnette Juniper, MD;  Location: [place];  Service: Gastroenterology;  Laterality: N/A;   ESOPHAGOGASTRODUODENOSCOPY (EGD) WITH PROPOFOL N/A 04/02/2019   Procedure: ESOPHAGOGASTRODUODENOSCOPY (EGD) WITH PROPOFOL;  Surgeon: Wilford Corner, MD;  Location: [place];  Service: Endoscopy;  Laterality: N/A;   ESOPHAGOGASTRODUODENOSCOPY (EGD) WITH PROPOFOL N/A 04/12/2019   Procedure: ESOPHAGOGASTRODUODENOSCOPY (EGD) WITH PROPOFOL;  Surgeon: Clarene Essex, MD;  Location: [place];  Service: Endoscopy;   Laterality: N/A;  And possible capsule endoscopy as well   GIVENS CAPSULE STUDY N/A 04/12/2019   Procedure: GIVENS CAPSULE STUDY;  Surgeon: Clarene Essex, MD;  Location: [place];  Service: Endoscopy;  Laterality: N/A;   HEMOSTASIS CLIP PLACEMENT  03/20/2019   Procedure: HEMOSTASIS CLIP PLACEMENT;  Surgeon: Ronnette Juniper, MD;  Location: [facility] ENDOSCOPY;  Service: Gastroenterology;;   HEMOSTASIS CONTROL  03/20/2019   Procedure: HEMOSTASIS CONTROL;  Surgeon: Ronnette Juniper, MD;  Location: [facility] ENDOSCOPY;  Service: Gastroenterology;;   POLYPECTOMY  03/16/2019   Procedure: POLYPECTOMY;  Surgeon: Ronnette Juniper, MD;  Location: [facility] ENDOSCOPY;  Service: Gastroenterology;;   POLYPECTOMY  03/20/2019   Procedure: POLYPECTOMY;  Surgeon: Ronnette Juniper, MD;  Location: [facility] ENDOSCOPY;  Service: Gastroenterology;;   REPAIR OF PERFORATED ULCER  2010  ?    Prior to Admission medications   Medication Sig Start Date End Date Taking? Authorizing Provider  ferrous sulfate 325 (65 FE) MG tablet Take 1 tablet (325 mg total) by mouth daily with breakfast. 06/02/19  Yes Matcha, Anupama, MD  ibuprofen (ADVIL) 200 MG tablet Take 200 mg by mouth every 6 (six) hours as needed for mild pain.   Yes [provider]  Multiple Vitamin (MULTIVITAMIN WITH MINERALS) TABS tablet Take 1 tablet by mouth daily. 04/18/19  Yes Earlene Plater, MD  nadolol (CORGARD) 40 MG tablet Take 1 tablet (40 mg total) by mouth daily. Patient not taking: Reported on 10/09/2020 03/14/20 06/12/20  Terrilee Croak, MD  pantoprazole (PROTONIX) 40 MG tablet Take 1 tablet (40 mg total) by mouth 2 (two) times daily. Patient not taking: Reported on 10/09/2020 03/14/20 06/12/20  Terrilee Croak, MD    Current Facility-Administered Medications  Medication Dose Route Frequency Provider Last Rate Last Admin   0.9 %  sodium chloride infusion   Intravenous Continuous Nicoletta Dress, Na, MD 75 mL/hr at 10/09/20 1035 New Bag at 10/09/20 1035   acetaminophen (TYLENOL) tablet 650 mg  650 mg Oral Q6H  PRN Charlann Lange, MD       Or   acetaminophen (TYLENOL) suppository 650 mg  650 mg Rectal Q6H PRN Charlann Lange, MD       folic acid (FOLVITE) tablet 1 mg  1 mg Oral Daily Li, Na, MD   1 mg at 10/09/20 1031   LORazepam (ATIVAN) tablet 1-4 mg  1-4 mg Oral Q1H PRN Charlann Lange, MD       Or   LORazepam (ATIVAN) injection 1-4 mg  1-4 mg Intravenous Q1H PRN Nicoletta Dress, Na, MD       multivitamin with minerals tablet 1 tablet  1 tablet Oral Daily Li, Na, MD   1 tablet at 10/09/20 1031   octreotide (SANDOSTATIN) 500 mcg in sodium chloride 0.9 % 250 mL (2 mcg/mL) infusion  50 mcg/hr Intravenous Continuous Li, Na, MD 25 mL/hr at 10/09/20 1031 50 mcg/hr at 10/09/20 1031   ondansetron (ZOFRAN) tablet 4 mg  4 mg Oral Q6H  PRN Charlann Lange, MD       Or   ondansetron (ZOFRAN) injection 4 mg  4 mg Intravenous Q6H PRN Charlann Lange, MD       [START ON 10/12/2020] pantoprazole (PROTONIX) injection 40 mg  40 mg Intravenous Q12H Nicoletta Dress, Na, MD       pantoprozole (PROTONIX) 80 mg /NS 100 mL infusion  8 mg/hr Intravenous Continuous Nicoletta Dress, Na, MD 10 mL/hr at 10/09/20 0808 8 mg/hr at 10/09/20 4166   thiamine tablet 100 mg  100 mg Oral Daily Nicoletta Dress, Na, MD   100 mg at 10/09/20 1031   Or   thiamine (B-1) injection 100 mg  100 mg Intravenous Daily Nicoletta Dress, Na, MD       Current Outpatient Medications  Medication Sig Dispense Refill   ferrous sulfate 325 (65 FE) MG tablet Take 1 tablet (325 mg total) by mouth daily with breakfast. 30 tablet 0   ibuprofen (ADVIL) 200 MG tablet Take 200 mg by mouth every 6 (six) hours as needed for mild pain.     Multiple Vitamin (MULTIVITAMIN WITH MINERALS) TABS tablet Take 1 tablet by mouth daily.     nadolol (CORGARD) 40 MG tablet Take 1 tablet (40 mg total) by mouth daily. (Patient not taking: Reported on 10/09/2020) 30 tablet 2   pantoprazole (PROTONIX) 40 MG tablet Take 1 tablet (40 mg total) by mouth 2 (two) times daily. (Patient not taking: Reported on 10/09/2020) 60 tablet 2    Allergies as of 10/09/2020 - Review Complete 10/09/2020   Allergen Reaction Noted   Vicodin [hydrocodone-acetaminophen] Itching 07/23/2019    Family History  Problem Relation Age of Onset   Diabetes Neg Hx    Hypertension Neg Hx     Social History   Socioeconomic History   Marital status: Single    Spouse name: Not on file   Number of children: Not on file   Years of education: Not on file   Highest education level: Not on file  Occupational History   Not on file  Tobacco Use   Smoking status: Some Days    Packs/day: 0.25    Types: Cigarettes   Smokeless tobacco: Never  Vaping Use   Vaping Use: Never used  Substance and Sexual Activity   Alcohol use: Yes    Alcohol/week: 2.0 standard drinks    Types: 2 Cans of beer per week    Comment: 2 cans of beer daily; previous ETOH abuse   Drug use: Yes    Types: Cocaine    Comment: every day   Sexual activity: Not Currently  Other Topics Concern   Not on file  Social History Narrative   Not on file   Social Determinants of Health   Financial Resource Strain: Not on file  Food Insecurity: Not on file  Transportation Needs: Not on file  Physical Activity: Not on file  Stress: Not on file  Social Connections: Not on file  Intimate Partner Violence: Not on file    Review of Systems: ROS is O/W negative except as mentioned in HPI.  Physical Exam: Vital signs in last 24 hours: Temp:  [97.6 F (36.4 C)-98 F (36.7 C)] 97.6 F (36.4 C) (10/02 0954) Pulse Rate:  [66-94] 70 (10/02 1015) Resp:  [12-34] 16 (10/02 1015) BP: (96-122)/(55-64) 107/59 (10/02 1015) SpO2:  [97 %-100 %] 99 % (10/02 1015) Weight:  [70.3 kg] 70.3 kg (10/02 0521)   General:   Alert,  Well-developed, well-nourished, pleasant and cooperative in NAD Head:  Normocephalic and atraumatic. Eyes:  Sclera clear, no icterus.  Conjunctiva pink. Ears:  Normal auditory acuity. Mouth:  No deformity or lesions.   Lungs:  Clear throughout to auscultation.  No wheezes, crackles, or rhonchi.  Heart:  Regular rate  and rhythm; no murmurs, clicks, rubs,  or gallops. Abdomen:  Soft,nontender, BS active,nonpalp mass or hsm.   Rectal:  Deferred  Msk:  Symmetrical without gross deformities. Pulses:  Normal pulses noted. Extremities:  Without clubbing or edema. Neurologic:  Alert and  oriented x4;  grossly normal neurologically. Skin:  Intact without significant lesions or rashes. Psych:  Alert and cooperative. Normal mood and affect.  Intake/Output this shift: Total I/O In: 388.5 [Blood:388.5] Out: -   Lab Results: Recent Labs    10/09/20 0533  WBC 6.6  HGB 6.9*  HCT 22.7*  PLT 157   BMET Recent Labs    10/09/20 0533  NA 140  K 3.7  CL 113*  CO2 16*  GLUCOSE 125*  BUN 11  CREATININE 0.92  CALCIUM 8.0*   LFT Recent Labs    10/09/20 0533  PROT 5.4*  ALBUMIN 2.4*  AST 100*  ALT 54*  ALKPHOS 107  BILITOT 0.5   IMPRESSION:  *Anemia and history of massive GI bleed in March to April 2021 when he required more than 34 units PRBCs at that time (received 34 here and more at [facility]).  Had extensive evaluation as above both here at [facility] and at [facility] but no definite cause of he massive bleeding was ever found.  It appears that he was also recently admitted for anemia and seen by GI at [place] in May where he had another EGD with small AVMs in the gastric body to which APC was applied.  Hgb 6.9 grams here, just received one unit PRBCs. *Cirrhosis secondary to alcohol: Apparently this is a new diagnosis to him last year.  He continues to drink alcohol, 2-3 beers per day.   *Grade 1 esophageal varices on previous endoscopies.  Is on nadolol. *Alcohol abuse: Continues to drink 2-4 beers daily. *Large ventral/incisional hernia: Previously contained transverse colon *Liver lesion: This was seen on MRI in March 2021 with 2 indeterminate lesions.  Recommend repeat MRI suggested a 74-month interval. *History of ascites and small pleural effusions: Was on some  diuretics last year, but does not appear he is taking those at this time. *Remote history of perforated ulcer 13 years or so ago with surgery for that  PLAN: -Agree with transfusion and monitoring hemoglobin. -Agree with octreotide and PPI drips for now due to his history of varices. -Check AFP. -Will repeat MRI liver protocol. -Enteroscopy on 10/3.  [place] for full liquids today then NPO after midnight. -Needs ETOH cessation.   Laban Emperor. Celisse Ciulla  10/09/2020, 10:39 AM

## 2020-10-09 NOTE — ED Triage Notes (Addendum)
65 y/o male with PMH of cirrhosis, alcoholism, tobacco use, prior GIB who presents from home via [facility] after 3 days of dark tarry stools. This episode started 3 days ago. He denies any abdominal pain, nausea, vomiting, fever, or chills. He reports feeling lightheaded just prior to calling EMS. He additionally endorse dyspnea and generalized weakness on exertion.  He reports a h/o prior GIB over a year ago. Reports he was here for 2 months during that admission and was sent to [facility] ultimately. States they never found the source of the bleed. He reports every couple of months dark tarry stools would flare up but then go away.   He reports previously drinking 1/5 of liquor a day but switched to drinking a 6 pack of beer a day after his last admission here.

## 2020-10-09 NOTE — Consult Note (Signed)
Referring Provider:  ? EDP Primary Care Physician:  [name], NP Primary Gastroenterologist:  Althia Forts  Reason for Consultation:  Anemia and melena, heme positive  HPI: Gavin Arroyo is a 65 y.o. male with PMH listed below who presents to [facility] hospital with complaints of bloody/black stools over the past 3 days.  Associate symptoms included SOB/DOE, intermittent blurry vision, generalized weakness and fatigue.  No abdominal pain, nausea, vomiting.  He says that he will have bleeding periodically but it sometimes will last a day or so and he just stays at home but this time it persisted so he came to the ED for evaluation.  Labs showed WBC 6.6, Hgb 6.9 grams, PLT 157. Per ED provider FOBT positive. Patient was started on Protonix IV gtt and octreotide gtt and received one unit PRBC transfusion at the ED.  INR pending.  Platelets 157.  LFTs with AST 100 and ALT 54 but total bili and ALP normal.  He has history of massive GI bleed in March/April 2021 that was evaluated here and then he was transferred to [facility] where he also underwent extensive evaluation.  That is summarized below.    More recently in March of this year he went to [facility] where they transfused him with 2 units of packed red blood cells and then transferred him here to [name] long hospital.  There he received 2 more units of packed red blood cells, but no GI evaluation at that time.  Then in May of this year he presented to [facility] where he underwent EGD with DR. Badreddine as follows:  Trace esophageal varices in the lower esophagus with no signs of bleeding  at all.  Changes of mild PHG in the proximal stomach.  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  APC was used and ablation performed with excellent hemostasis.  Mild amount of solid food seen in the gastric body.  No gastric varices seen.  The first and second part of the duodenum appeared normal.    March/April 2021 events summarized as follows:  "Patient first presented with acute encephalopathy (resolved at transfer) and persistent GI bleed on 03/14/2019 with hemoglobin of 5.0 grams. Was discharged twice, and then readmitted within 12 hours for the same presentation. Per documentation, has had both melenic and bright red bloody stools during his hospital stay. Work-up at the OSH has been notable for an EGD with evidence of hematin, nonbleeding grade 1 esophageal varices and portal hypertensive gastropathy. Also had a colonoscopy with nonbleeding diverticula, 3 polyps in the transverse colon, 2 in the ascending colon and one in the sigmoid colon all of which were removed. Repeat colonoscopy was notable for ulcerations at the site of polypectomies. Repeat EGD showed oozing from portal hypertensive gastropathy and again small nonbleeding varices. Had a tagged RBC with no evident bleeding. Lastly, had a repeat EGD with capsule deployed that revealed bleeding in the middle and distal part of small bowel. Per discussion between GI team, decision was made to immediately transferred to [facility] for double-balloon enteroscopy. In total, he has received 34 units of blood over the past month of March prior to transfer. Also received Protonix 80 --> 40 BID, sucralfate and octreotide on discharge.  He transferred from OSH on 4/10. He underwent anterograde and retrograde DBE which showed various sources of bleeding but did not show the likely source of his initial significant hemorrhage requiring > 30U transfusion. He  also underwent CT GIB and Tagged RBC scan. After an episode of bleeding requiring multiple units pRBC and hypotension, admitted to NSICU on 4/20. He underwent EGD and colonoscopy on 4/21 that showed 2 ulcerations noted on colonoscopy x3 clips placed. Video capsule placed during upper endoscopy after no significant source of bleeding found. Taken off pressors on 4/22 and transferred to floor on 4/23 after  Hgb stable. Discharged without evidence of recurrent bleeding after 7 days.   For any recurrent bleeding then recommend CT -GI bleed protocol and consulting IR for suspected SMA territory bleed based on prior capsule results for small intestinal bleeding.   He was discharged from [facility] on 05/04/2020.  GI procedures at [facility]:  Capsule Endoscopy 04/29/19 Impression: only fresh blood seen at site capsule placed and a little distale, rest of small bowel no blood. previous clip placed seen  Upper Endoscopy 04/29/2019  - Grade I esophageal varices. - Widely patent Schatzki ring. - Portal hypertensive gastropathy. - Mass (suspected adenoma) in the duodenal bulb. No specimens collected.  Colonoscopy 04/29/2019  - Mucosal ulceration. Clips (MR conditional) were placed. - One polyp in the transverse colon with ulceration. Clip (MR conditional) was placed. - Blood in the terminal ileum.  Anterior Double Balloon Enteroscopy 04/27/19 - Diverticulosis in the left colon. Prep of colon was not adequate for colon cancer screening purposes. - A single colonic angioectasia. Treated with argon plasma coagulation (APC). - The examined portion of the ileum was normal. - The examined portion of the ileum was normal. - A single angioectasia in the ileum. Treated with argon plasma coagulation (APC). - A tattoo was seen in the ileum. The tattoo site marks the depth of maximum advancement of the scope on a previous exam (recent antegrade DBE). - No specimens collected.  Last GI procedures at [facility] hospital before transfer to [facility]:  Video capsule endoscopy April 14, 2019 showed bleeding in the mid to distal small bowel.  EGD April 04/12/2019: -Barely grade I esophageal varices. - Portal hypertensive gastropathy. Mild antritis as well - Normal duodenal bulb, first portion of the duodenum, second portion of the duodenum and third portion of the duodenum.  Colonoscopy with Dr. Watt Climes April 07, 2019 showed poor prep, but  blood in the entire colon.  No active bleeding seen.  During all of that evaluation he was also diagnosed with cirrhosis, secondary to alcohol.  He was drinking a lot of hard liquor at that time.  Obviously as stated in procedures he has had trace esophageal varices.  He is on nadolol.  He also had some ascites at that time and was on some diuretics, but he has had no recurrent issues with that and is not on diuretics at this time.  He had an MRI showing some indeterminate liver lesions for which they recommended follow-up MRI at 71-month interval has not been performed.  He continues to drink alcohol about 2-4 beers daily.   Past Medical History:  Diagnosis Date   GI bleeding 03/2019    Past Surgical History:  Procedure Laterality Date   COLONOSCOPY WITH PROPOFOL N/A 03/16/2019   Procedure: COLONOSCOPY WITH PROPOFOL;  Surgeon: Ronnette Juniper, MD;  Location: [place];  Service: Gastroenterology;  Laterality: N/A;   COLONOSCOPY WITH PROPOFOL N/A 03/20/2019   Procedure: COLONOSCOPY WITH PROPOFOL;  Surgeon: Ronnette Juniper, MD;  Location: [place];  Service: Gastroenterology;  Laterality: N/A;   COLONOSCOPY WITH PROPOFOL N/A 04/07/2019   Procedure: COLONOSCOPY WITH PROPOFOL;  Surgeon: Clarene Essex, MD;  Location:  [place] ENDOSCOPY;  Service: Endoscopy;  Laterality: N/A;   ESOPHAGOGASTRODUODENOSCOPY (EGD) WITH PROPOFOL N/A 03/16/2019   Procedure: ESOPHAGOGASTRODUODENOSCOPY (EGD) WITH PROPOFOL;  Surgeon: Ronnette Juniper, MD;  Location: [place];  Service: Gastroenterology;  Laterality: N/A;   ESOPHAGOGASTRODUODENOSCOPY (EGD) WITH PROPOFOL N/A 04/02/2019   Procedure: ESOPHAGOGASTRODUODENOSCOPY (EGD) WITH PROPOFOL;  Surgeon: Wilford Corner, MD;  Location: [place];  Service: Endoscopy;  Laterality: N/A;   ESOPHAGOGASTRODUODENOSCOPY (EGD) WITH PROPOFOL N/A 04/12/2019   Procedure: ESOPHAGOGASTRODUODENOSCOPY (EGD) WITH PROPOFOL;  Surgeon: Clarene Essex, MD;  Location: [place];  Service: Endoscopy;   Laterality: N/A;  And possible capsule endoscopy as well   GIVENS CAPSULE STUDY N/A 04/12/2019   Procedure: GIVENS CAPSULE STUDY;  Surgeon: Clarene Essex, MD;  Location: [place];  Service: Endoscopy;  Laterality: N/A;   HEMOSTASIS CLIP PLACEMENT  03/20/2019   Procedure: HEMOSTASIS CLIP PLACEMENT;  Surgeon: Ronnette Juniper, MD;  Location: [facility] ENDOSCOPY;  Service: Gastroenterology;;   HEMOSTASIS CONTROL  03/20/2019   Procedure: HEMOSTASIS CONTROL;  Surgeon: Ronnette Juniper, MD;  Location: [facility] ENDOSCOPY;  Service: Gastroenterology;;   POLYPECTOMY  03/16/2019   Procedure: POLYPECTOMY;  Surgeon: Ronnette Juniper, MD;  Location: [facility] ENDOSCOPY;  Service: Gastroenterology;;   POLYPECTOMY  03/20/2019   Procedure: POLYPECTOMY;  Surgeon: Ronnette Juniper, MD;  Location: [facility] ENDOSCOPY;  Service: Gastroenterology;;   REPAIR OF PERFORATED ULCER  2010  ?    Prior to Admission medications   Medication Sig Start Date End Date Taking? Authorizing Provider  ferrous sulfate 325 (65 FE) MG tablet Take 1 tablet (325 mg total) by mouth daily with breakfast. 06/02/19  Yes Matcha, Anupama, MD  ibuprofen (ADVIL) 200 MG tablet Take 200 mg by mouth every 6 (six) hours as needed for mild pain.   Yes [provider]  Multiple Vitamin (MULTIVITAMIN WITH MINERALS) TABS tablet Take 1 tablet by mouth daily. 04/18/19  Yes Earlene Plater, MD  nadolol (CORGARD) 40 MG tablet Take 1 tablet (40 mg total) by mouth daily. Patient not taking: Reported on 10/09/2020 03/14/20 06/12/20  Terrilee Croak, MD  pantoprazole (PROTONIX) 40 MG tablet Take 1 tablet (40 mg total) by mouth 2 (two) times daily. Patient not taking: Reported on 10/09/2020 03/14/20 06/12/20  Terrilee Croak, MD    Current Facility-Administered Medications  Medication Dose Route Frequency Provider Last Rate Last Admin   0.9 %  sodium chloride infusion   Intravenous Continuous Nicoletta Dress, Na, MD 75 mL/hr at 10/09/20 1035 New Bag at 10/09/20 1035   acetaminophen (TYLENOL) tablet 650 mg  650 mg Oral Q6H  PRN Charlann Lange, MD       Or   acetaminophen (TYLENOL) suppository 650 mg  650 mg Rectal Q6H PRN Charlann Lange, MD       folic acid (FOLVITE) tablet 1 mg  1 mg Oral Daily Li, Na, MD   1 mg at 10/09/20 1031   LORazepam (ATIVAN) tablet 1-4 mg  1-4 mg Oral Q1H PRN Charlann Lange, MD       Or   LORazepam (ATIVAN) injection 1-4 mg  1-4 mg Intravenous Q1H PRN Nicoletta Dress, Na, MD       multivitamin with minerals tablet 1 tablet  1 tablet Oral Daily Li, Na, MD   1 tablet at 10/09/20 1031   octreotide (SANDOSTATIN) 500 mcg in sodium chloride 0.9 % 250 mL (2 mcg/mL) infusion  50 mcg/hr Intravenous Continuous Li, Na, MD 25 mL/hr at 10/09/20 1031 50 mcg/hr at 10/09/20 1031   ondansetron (ZOFRAN) tablet 4 mg  4 mg Oral Q6H  PRN Charlann Lange, MD       Or   ondansetron (ZOFRAN) injection 4 mg  4 mg Intravenous Q6H PRN Charlann Lange, MD       [START ON 10/12/2020] pantoprazole (PROTONIX) injection 40 mg  40 mg Intravenous Q12H Nicoletta Dress, Na, MD       pantoprozole (PROTONIX) 80 mg /NS 100 mL infusion  8 mg/hr Intravenous Continuous Nicoletta Dress, Na, MD 10 mL/hr at 10/09/20 0808 8 mg/hr at 10/09/20 8250   thiamine tablet 100 mg  100 mg Oral Daily Nicoletta Dress, Na, MD   100 mg at 10/09/20 1031   Or   thiamine (B-1) injection 100 mg  100 mg Intravenous Daily Nicoletta Dress, Na, MD       Current Outpatient Medications  Medication Sig Dispense Refill   ferrous sulfate 325 (65 FE) MG tablet Take 1 tablet (325 mg total) by mouth daily with breakfast. 30 tablet 0   ibuprofen (ADVIL) 200 MG tablet Take 200 mg by mouth every 6 (six) hours as needed for mild pain.     Multiple Vitamin (MULTIVITAMIN WITH MINERALS) TABS tablet Take 1 tablet by mouth daily.     nadolol (CORGARD) 40 MG tablet Take 1 tablet (40 mg total) by mouth daily. (Patient not taking: Reported on 10/09/2020) 30 tablet 2   pantoprazole (PROTONIX) 40 MG tablet Take 1 tablet (40 mg total) by mouth 2 (two) times daily. (Patient not taking: Reported on 10/09/2020) 60 tablet 2    Allergies as of 10/09/2020 - Review Complete 10/09/2020   Allergen Reaction Noted   Vicodin [hydrocodone-acetaminophen] Itching 07/23/2019    Family History  Problem Relation Age of Onset   Diabetes Neg Hx    Hypertension Neg Hx     Social History   Socioeconomic History   Marital status: Single    Spouse name: Not on file   Number of children: Not on file   Years of education: Not on file   Highest education level: Not on file  Occupational History   Not on file  Tobacco Use   Smoking status: Some Days    Packs/day: 0.25    Types: Cigarettes   Smokeless tobacco: Never  Vaping Use   Vaping Use: Never used  Substance and Sexual Activity   Alcohol use: Yes    Alcohol/week: 2.0 standard drinks    Types: 2 Cans of beer per week    Comment: 2 cans of beer daily; previous ETOH abuse   Drug use: Yes    Types: Cocaine    Comment: every day   Sexual activity: Not Currently  Other Topics Concern   Not on file  Social History Narrative   Not on file   Social Determinants of Health   Financial Resource Strain: Not on file  Food Insecurity: Not on file  Transportation Needs: Not on file  Physical Activity: Not on file  Stress: Not on file  Social Connections: Not on file  Intimate Partner Violence: Not on file    Review of Systems: ROS is O/W negative except as mentioned in HPI.  Physical Exam: Vital signs in last 24 hours: Temp:  [97.6 F (36.4 C)-98 F (36.7 C)] 97.6 F (36.4 C) (10/02 0954) Pulse Rate:  [66-94] 70 (10/02 1015) Resp:  [12-34] 16 (10/02 1015) BP: (96-122)/(55-64) 107/59 (10/02 1015) SpO2:  [97 %-100 %] 99 % (10/02 1015) Weight:  [70.3 kg] 70.3 kg (10/02 0521)   General:   Alert,  Well-developed, well-nourished, pleasant and cooperative in NAD Head:  Normocephalic and atraumatic. Eyes:  Sclera clear, no icterus.  Conjunctiva pink. Ears:  Normal auditory acuity. Mouth:  No deformity or lesions.   Lungs:  Clear throughout to auscultation.  No wheezes, crackles, or rhonchi.  Heart:  Regular rate  and rhythm; no murmurs, clicks, rubs,  or gallops. Abdomen:  Soft,nontender, BS active,nonpalp mass or hsm.   Rectal:  Deferred  Msk:  Symmetrical without gross deformities. Pulses:  Normal pulses noted. Extremities:  Without clubbing or edema. Neurologic:  Alert and  oriented x4;  grossly normal neurologically. Skin:  Intact without significant lesions or rashes. Psych:  Alert and cooperative. Normal mood and affect.  Intake/Output this shift: Total I/O In: 388.5 [Blood:388.5] Out: -   Lab Results: Recent Labs    10/09/20 0533  WBC 6.6  HGB 6.9*  HCT 22.7*  PLT 157   BMET Recent Labs    10/09/20 0533  NA 140  K 3.7  CL 113*  CO2 16*  GLUCOSE 125*  BUN 11  CREATININE 0.92  CALCIUM 8.0*   LFT Recent Labs    10/09/20 0533  PROT 5.4*  ALBUMIN 2.4*  AST 100*  ALT 54*  ALKPHOS 107  BILITOT 0.5   IMPRESSION:  *Anemia and history of massive GI bleed in March to April 2021 when he required more than 34 units PRBCs at that time (received 34 here and more at [facility]).  Had extensive evaluation as above both here at [facility] and at [facility] but no definite cause of he massive bleeding was ever found.  It appears that he was also recently admitted for anemia and seen by GI at [place] in May where he had another EGD with small AVMs in the gastric body to which APC was applied.  Hgb 6.9 grams here, just received one unit PRBCs. *Cirrhosis secondary to alcohol: Apparently this is a new diagnosis to him last year.  He continues to drink alcohol, 2-3 beers per day.   *Grade 1 esophageal varices on previous endoscopies.  Is on nadolol. *Alcohol abuse: Continues to drink 2-4 beers daily. *Large ventral/incisional hernia: Previously contained transverse colon *Liver lesion: This was seen on MRI in March 2021 with 2 indeterminate lesions.  Recommend repeat MRI suggested a 24-month interval. *History of ascites and small pleural effusions: Was on some  diuretics last year, but does not appear he is taking those at this time. *Remote history of perforated ulcer 13 years or so ago with surgery for that  PLAN: -Agree with transfusion and monitoring hemoglobin. -Agree with octreotide and PPI drips for now due to his history of varices. -Check AFP. -Will repeat MRI liver protocol. -Enteroscopy on 10/3.  [place] for full liquids today then NPO after midnight. -Needs ETOH cessation.   Laban Emperor. Jasmon Graffam  10/09/2020, 10:39 AM

## 2020-10-09 NOTE — ED Notes (Signed)
RN restarted IV fluids, was not running.  PT is alert and oriented, able to get to bathroom by himself.  States he feels "a little better," inquired as to his HGB.

## 2020-10-09 NOTE — H&P (Signed)
History and Physical    Gavin Arroyo GYI:[id number] DOB: 04-30-1955 DOA: 10/09/2020  PCP: [name], NP Patient coming from: Home  Chief Complaint: Melena  HPI: Gavin Arroyo is a 65 y.o. male with medical history significant of heavy alcohol abuse, cirrhosis (likely from alcohol related liver disease), grade 1 esophageal varices and portal hypertensive gastropathy, a complicated history of GI bleed requiring transfer to [facility] in April 2021, remote perforated abdominal ulcer, ventral hernia,who presented with melena.  Patient was hospitalized for GI bleed at [facility] in March 2021. He had received 34 units of PRBCs in about 1 month of hospitalization before he was transferred to [facility] for double-balloon enteroscopy. He has had a complicated hospital stay at [facility] and Endoscopic procedures. Patient states that he has since cut down his alcohol consumption, and only drinks 2-4 beers daily. Denies abuse liquor or liquor.  His last drink was yesterday afternoon.  Patient states that he started to have intermittent melena about 3 days ago.  Associate symptoms included SOB/DOE, intermittent blurry vision, generalized weakness and fatigue.  Denies syncope or near syncope episodes.  Denies hematemesis, bright red blood per rectum, nausea, vomiting, abdominal pain.  Denies fevers, chills, cough, wheezing, chest pain, dysuria, urinary frequency or urgency.  Patient came to ED for further evaluation.  In the emergency room, he was afebrile with a pulse 94, RR 18, BP 103/56.  The labs showed WBC 6.6, Hgb 6.9, PLT 157. Per ED provider FOBT positive. Patient was started on Protonix IV and PRBC transfusion at the ED.    Review of Systems: As per HPI otherwise 10 point review of systems negative.  Review of Systems Otherwise negative except as per HPI, including: General: Denies fever, chills, night sweats or unintended weight loss. Resp: Denies cough, wheezing, shortness of breath. Cardiac: Denies chest  pain, palpitations, orthopnea, paroxysmal nocturnal dyspnea. GI: Denies abdominal pain, nausea, vomiting, diarrhea or constipation.  Positive for melena. GU: Denies dysuria, frequency, hesitancy or incontinence MS: Denies muscle aches, joint pain or swelling Neuro: Denies headache, neurologic deficits (focal weakness, numbness, tingling), abnormal gait.  Positive for intermittent blurry vision and generalized weakness Psych: Denies anxiety, depression, SI/HI/AVH Skin: Denies new rashes or lesions ID: Denies sick contacts, exotic exposures, travel  Past Medical History:  Diagnosis Date   GI bleeding 03/2019    Past Surgical History:  Procedure Laterality Date   COLONOSCOPY WITH PROPOFOL N/A 03/16/2019   Procedure: COLONOSCOPY WITH PROPOFOL;  Surgeon: Ronnette Juniper, MD;  Location: [place];  Service: Gastroenterology;  Laterality: N/A;   COLONOSCOPY WITH PROPOFOL N/A 03/20/2019   Procedure: COLONOSCOPY WITH PROPOFOL;  Surgeon: Ronnette Juniper, MD;  Location: [place];  Service: Gastroenterology;  Laterality: N/A;   COLONOSCOPY WITH PROPOFOL N/A 04/07/2019   Procedure: COLONOSCOPY WITH PROPOFOL;  Surgeon: Clarene Essex, MD;  Location: [place];  Service: Endoscopy;  Laterality: N/A;   ESOPHAGOGASTRODUODENOSCOPY (EGD) WITH PROPOFOL N/A 03/16/2019   Procedure: ESOPHAGOGASTRODUODENOSCOPY (EGD) WITH PROPOFOL;  Surgeon: Ronnette Juniper, MD;  Location: [place];  Service: Gastroenterology;  Laterality: N/A;   ESOPHAGOGASTRODUODENOSCOPY (EGD) WITH PROPOFOL N/A 04/02/2019   Procedure: ESOPHAGOGASTRODUODENOSCOPY (EGD) WITH PROPOFOL;  Surgeon: Wilford Corner, MD;  Location: [place];  Service: Endoscopy;  Laterality: N/A;   ESOPHAGOGASTRODUODENOSCOPY (EGD) WITH PROPOFOL N/A 04/12/2019   Procedure: ESOPHAGOGASTRODUODENOSCOPY (EGD) WITH PROPOFOL;  Surgeon: Clarene Essex, MD;  Location: [place];  Service: Endoscopy;  Laterality: N/A;  And possible capsule endoscopy as well   GIVENS CAPSULE STUDY N/A  04/12/2019  Procedure: GIVENS CAPSULE STUDY;  Surgeon: Clarene Essex, MD;  Location: [place];  Service: Endoscopy;  Laterality: N/A;   HEMOSTASIS CLIP PLACEMENT  03/20/2019   Procedure: HEMOSTASIS CLIP PLACEMENT;  Surgeon: Ronnette Juniper, MD;  Location: [facility] ENDOSCOPY;  Service: Gastroenterology;;   HEMOSTASIS CONTROL  03/20/2019   Procedure: HEMOSTASIS CONTROL;  Surgeon: Ronnette Juniper, MD;  Location: [facility] ENDOSCOPY;  Service: Gastroenterology;;   POLYPECTOMY  03/16/2019   Procedure: POLYPECTOMY;  Surgeon: Ronnette Juniper, MD;  Location: [facility] ENDOSCOPY;  Service: Gastroenterology;;   POLYPECTOMY  03/20/2019   Procedure: POLYPECTOMY;  Surgeon: Ronnette Juniper, MD;  Location: [facility] ENDOSCOPY;  Service: Gastroenterology;;   REPAIR OF PERFORATED ULCER  2010  ?    SOCIAL HISTORY:  reports that he has been smoking cigarettes. He has been smoking an average of .25 packs per day. He has never used smokeless tobacco. He reports current alcohol use of about 2.0 standard drinks per week. He reports current drug use. Drug: Cocaine.  Allergies  Allergen Reactions   Vicodin [Hydrocodone-Acetaminophen] Itching    FAMILY HISTORY: Family History  Problem Relation Age of Onset   Diabetes Neg Hx    Hypertension Neg Hx      Prior to Admission medications   Medication Sig Start Date End Date Taking? Authorizing Provider  ferrous sulfate 325 (65 FE) MG tablet Take 1 tablet (325 mg total) by mouth daily with breakfast. 06/02/19  Yes Matcha, Anupama, MD  ibuprofen (ADVIL) 200 MG tablet Take 200 mg by mouth every 6 (six) hours as needed for mild pain.   Yes [provider]  Multiple Vitamin (MULTIVITAMIN WITH MINERALS) TABS tablet Take 1 tablet by mouth daily. 04/18/19  Yes Earlene Plater, MD  nadolol (CORGARD) 40 MG tablet Take 1 tablet (40 mg total) by mouth daily. Patient not taking: Reported on 10/09/2020 03/14/20 06/12/20  Terrilee Croak, MD  pantoprazole (PROTONIX) 40 MG tablet Take 1 tablet (40 mg total) by mouth 2  (two) times daily. Patient not taking: Reported on 10/09/2020 03/14/20 06/12/20  Terrilee Croak, MD    Physical Exam: Vitals:   10/09/20 0802 10/09/20 0814 10/09/20 0815 10/09/20 0830  BP: 103/64 (!) 103/58 (!) 103/58 (!) 105/55  Pulse:  71 72 74  Resp: 16 15 15 17   Temp:  98 F (36.7 C)    TempSrc:  Oral    SpO2:   100% 100%  Weight:      Height:          Constitutional: NAD, calm, comfortable.  Acute ill-appearing.  Chronic ill-appearing. Eyes: PERRL, lids and conjunctivae normal ENMT: Mucous membranes are moist. Posterior pharynx clear of any exudate or lesions.Normal dentition.  Neck: normal, supple, no masses, no thyromegaly Respiratory: clear to auscultation bilaterally, no wheezing, no crackles. Normal respiratory effort. No accessory muscle use.  Cardiovascular: Regular rate and rhythm, no murmurs / rubs / gallops. No extremity edema. 2+ pedal pulses. No carotid bruits.  Abdomen: no tenderness, no masses palpated. No hepatosplenomegaly. Bowel sounds positive.  Chronic ventral hernia noted Musculoskeletal: no clubbing / cyanosis. No joint deformity upper and lower extremities. Good ROM, no contractures. Normal muscle tone.  Skin: no rashes, lesions, ulcers. No induration Neurologic: CN 2-12 grossly intact. Sensation intact, DTR normal. Strength 5/5 in all 4.  Psychiatric: Normal judgment and insight. Alert and oriented x 3. Normal mood.     Labs on Admission: I have personally reviewed following labs and imaging studies  CBC: Recent Labs  Lab 10/09/20 0533  WBC 6.6  HGB 6.9*  HCT 22.7*  MCV 93.8  PLT 749   Basic Metabolic Panel: Recent Labs  Lab 10/09/20 0533  Brittane Grudzinski 140  K 3.7  CL 113*  CO2 16*  GLUCOSE 125*  BUN 11  CREATININE 0.92  CALCIUM 8.0*   GFR: Estimated Creatinine Clearance: 81.7 mL/min (by C-G formula based on SCr of 0.92 mg/dL). Liver Function Tests: Recent Labs  Lab 10/09/20 0533  AST 100*  ALT 54*  ALKPHOS 107  BILITOT 0.5  PROT 5.4*   ALBUMIN 2.4*   No results for input(s): LIPASE, AMYLASE in the last 168 hours. No results for input(s): AMMONIA in the last 168 hours. Coagulation Profile: No results for input(s): INR, PROTIME in the last 168 hours. Cardiac Enzymes: No results for input(s): CKTOTAL, CKMB, CKMBINDEX, TROPONINI in the last 168 hours. BNP (last 3 results) No results for input(s): PROBNP in the last 8760 hours. HbA1C: No results for input(s): HGBA1C in the last 72 hours. CBG: No results for input(s): GLUCAP in the last 168 hours. Lipid Profile: No results for input(s): CHOL, HDL, LDLCALC, TRIG, CHOLHDL, LDLDIRECT in the last 72 hours. Thyroid Function Tests: No results for input(s): TSH, T4TOTAL, FREET4, T3FREE, THYROIDAB in the last 72 hours. Anemia Panel: No results for input(s): VITAMINB12, FOLATE, FERRITIN, TIBC, IRON, RETICCTPCT in the last 72 hours. Urine analysis:    Component Value Date/Time   COLORURINE YELLOW 03/13/2020 0150   APPEARANCEUR CLEAR 03/13/2020 0150   LABSPEC 1.020 03/13/2020 0150   PHURINE 5.0 03/13/2020 0150   GLUCOSEU NEGATIVE 03/13/2020 0150   HGBUR NEGATIVE 03/13/2020 0150   BILIRUBINUR NEGATIVE 03/13/2020 0150   KETONESUR NEGATIVE 03/13/2020 0150   PROTEINUR NEGATIVE 03/13/2020 0150   NITRITE NEGATIVE 03/13/2020 0150   LEUKOCYTESUR NEGATIVE 03/13/2020 0150   Sepsis Labs: !!!!!!!!!!!!!!!!!!!!!!!!!!!!!!!!!!!!!!!!!!!! @LABRCNTIP (procalcitonin:4,lacticidven:4) )No results found for this or any previous visit (from the past 240 hour(s)).   Radiological Exams on Admission: No results found.   All images have been reviewed by me personally.  EKG: Independently reviewed.   Assessment/Plan Principal Problem:   GIB (gastrointestinal bleeding) Active Problems:   Acute blood loss anemia   Hepatic cirrhosis (HCC)   Alcohol abuse   Portal hypertensive gastropathy ([place])   Assessment Plan  # upper GIB # acute blood loss anemia #Complicated history of GI bleed  requiring transfer to [place] in April 2021  Patient presented with melena x 3 days.  Hemoglobin 6.9 with baseline HGB 8.9.  He is hemodynamically stable present.  Will admit for GI bleed work-up. Give his hx of esophageal varices, will cover with Protonix and octreotide drip pending GI consult.   - obs tele - HGB 6.9 ( BL 8.9) - INR pending - trend HH Q6h - Protonix IVF - Octreotide drip - GI consult - spoke to on call GI   # History of grade 1 esophageal varices #History of portal hypertensive gastropathy #Liver cirrhosis  - noted in his history  #History of alcohol abuse  - admit that he drinks less alcohol 2-4 neers now. Last drink on 10/08/20/ - CIWA protocol.  - alcohol cessation education done   #History of ventral hernia  - chronic and stable           DVT prophylaxis: SCD Code Status: Full code Family Communication: None at bedside Consults called: GI Admission status: Observation  Status is: Observation  The patient remains OBS appropriate and will d/c before 2 midnights.  Dispo: The patient is from: Home  Anticipated d/c is to: Home              Patient currently is not medically stable to d/c.   Difficult to place patient No       Time Spent: 65 minutes.  >50% of the time was devoted to discussing the patients care, assessment, plan and disposition with other care givers along with counseling the patient about the risks and benefits of treatment.    Charlann Lange MD [facility]  If 7PM-7AM, please contact night-coverage   10/09/2020, 9:01 AM

## 2020-10-09 NOTE — ED Provider Notes (Addendum)
[place] EMERGENCY DEPARTMENT Provider Note   CSN: [id number] Arrival date & time: 10/09/20  0515     History No chief complaint on file.   Gavin Arroyo is a 65 y.o. male.  HPI     This is a 65 year old male with a history of GI bleed, cirrhosis, alcohol abuse who presents with dark stools.  Patient reports over the last 3 days he has had intermittent dark tarry stools.  He states he felt lightheaded this morning and feels like his hemoglobin is low.  He has a history of GI bleed with similar symptoms.  He continues to drink alcohol daily but now drinks beer instead of liquor.  Has not noted any hematemesis or coffee-ground emesis.  Denies abdominal pain.  Denies fevers, chest pain, shortness of breath.  Patient continues to drink daily up to a 6 pack of beer per day.  He does not take any anti-inflammatories.  His last endoscopy/colonoscopy in the [facility] health system was in 2021.  This was by Clarene Essex.  He ultimately had a capsule endoscopy that indicated some bleeding in the small bowel.  He was transferred to [facility].  Prior to that he had an endoscopy that showed grade 1 varices and gastritis.  Past Medical History:  Diagnosis Date   GI bleeding 03/2019    Patient Active Problem List   Diagnosis Date Noted   Symptomatic anemia 03/12/2020   Upper GI bleed 03/12/2020   Alcohol abuse 03/12/2020   Iron deficiency anemia 06/06/2019   GIB (gastrointestinal bleeding) 05/30/2019   Hepatic encephalopathy 03/30/2019   Malnutrition of moderate degree 03/28/2019   Hepatic cirrhosis ([place]) 03/24/2019   Acute GI bleeding 03/20/2019   Acute blood loss anemia 03/19/2019   Anemia    NAGMA 03/14/2019   Alcohol use disorder, moderate, dependence ([place]) 03/14/2019    Past Surgical History:  Procedure Laterality Date   COLONOSCOPY WITH PROPOFOL N/A 03/16/2019   Procedure: COLONOSCOPY WITH PROPOFOL;  Surgeon: Ronnette Juniper, MD;  Location: [place];  Service:  Gastroenterology;  Laterality: N/A;   COLONOSCOPY WITH PROPOFOL N/A 03/20/2019   Procedure: COLONOSCOPY WITH PROPOFOL;  Surgeon: Ronnette Juniper, MD;  Location: [place];  Service: Gastroenterology;  Laterality: N/A;   COLONOSCOPY WITH PROPOFOL N/A 04/07/2019   Procedure: COLONOSCOPY WITH PROPOFOL;  Surgeon: Clarene Essex, MD;  Location: [place];  Service: Endoscopy;  Laterality: N/A;   ESOPHAGOGASTRODUODENOSCOPY (EGD) WITH PROPOFOL N/A 03/16/2019   Procedure: ESOPHAGOGASTRODUODENOSCOPY (EGD) WITH PROPOFOL;  Surgeon: Ronnette Juniper, MD;  Location: [place];  Service: Gastroenterology;  Laterality: N/A;   ESOPHAGOGASTRODUODENOSCOPY (EGD) WITH PROPOFOL N/A 04/02/2019   Procedure: ESOPHAGOGASTRODUODENOSCOPY (EGD) WITH PROPOFOL;  Surgeon: Wilford Corner, MD;  Location: [place];  Service: Endoscopy;  Laterality: N/A;   ESOPHAGOGASTRODUODENOSCOPY (EGD) WITH PROPOFOL N/A 04/12/2019   Procedure: ESOPHAGOGASTRODUODENOSCOPY (EGD) WITH PROPOFOL;  Surgeon: Clarene Essex, MD;  Location: [place];  Service: Endoscopy;  Laterality: N/A;  And possible capsule endoscopy as well   GIVENS CAPSULE STUDY N/A 04/12/2019   Procedure: GIVENS CAPSULE STUDY;  Surgeon: Clarene Essex, MD;  Location: [place];  Service: Endoscopy;  Laterality: N/A;   HEMOSTASIS CLIP PLACEMENT  03/20/2019   Procedure: HEMOSTASIS CLIP PLACEMENT;  Surgeon: Ronnette Juniper, MD;  Location: [facility] ENDOSCOPY;  Service: Gastroenterology;;   HEMOSTASIS CONTROL  03/20/2019   Procedure: HEMOSTASIS CONTROL;  Surgeon: Ronnette Juniper, MD;  Location: [facility] ENDOSCOPY;  Service: Gastroenterology;;   POLYPECTOMY  03/16/2019   Procedure: POLYPECTOMY;  Surgeon: Ronnette Juniper, MD;  Location:  [facility] ENDOSCOPY;  Service: Gastroenterology;;   POLYPECTOMY  03/20/2019   Procedure: POLYPECTOMY;  Surgeon: Ronnette Juniper, MD;  Location: [facility] ENDOSCOPY;  Service: Gastroenterology;;   REPAIR OF PERFORATED ULCER  2010  ?       Family History  Problem Relation Age of Onset   Diabetes Neg  Hx    Hypertension Neg Hx     Social History   Tobacco Use   Smoking status: Some Days    Packs/day: 0.25    Types: Cigarettes   Smokeless tobacco: Never  Vaping Use   Vaping Use: Never used  Substance Use Topics   Alcohol use: Yes    Alcohol/week: 2.0 standard drinks    Types: 2 Cans of beer per week    Comment: 2 cans of beer daily; previous ETOH abuse   Drug use: Yes    Types: Cocaine    Comment: every day    Home Medications Prior to Admission medications   Medication Sig Start Date End Date Taking? Authorizing Provider  ferrous sulfate 325 (65 FE) MG tablet Take 1 tablet (325 mg total) by mouth daily with breakfast. 06/02/19  Yes Matcha, Anupama, MD  ibuprofen (ADVIL) 200 MG tablet Take 200 mg by mouth every 6 (six) hours as needed for mild pain.   Yes [provider]  Multiple Vitamin (MULTIVITAMIN WITH MINERALS) TABS tablet Take 1 tablet by mouth daily. 04/18/19  Yes Earlene Plater, MD  nadolol (CORGARD) 40 MG tablet Take 1 tablet (40 mg total) by mouth daily. Patient not taking: Reported on 10/09/2020 03/14/20 06/12/20  Terrilee Croak, MD  pantoprazole (PROTONIX) 40 MG tablet Take 1 tablet (40 mg total) by mouth 2 (two) times daily. Patient not taking: Reported on 10/09/2020 03/14/20 06/12/20  Terrilee Croak, MD    Allergies    Vicodin [hydrocodone-acetaminophen]  Review of Systems   Review of Systems  Constitutional:  Negative for fever.  Respiratory:  Negative for cough and shortness of breath.   Cardiovascular:  Negative for chest pain.  Gastrointestinal:  Positive for abdominal pain and blood in stool. Negative for diarrhea, nausea and vomiting.  Genitourinary:  Negative for dysuria.  All other systems reviewed and are negative.  Physical Exam Updated Vital Signs BP (!) 103/56 (BP Location: Left Arm)   Pulse 94   Temp 97.7 F (36.5 C) (Oral)   Resp 18   Ht 1.803 m (5\' 11" )   Wt 70.3 kg   SpO2 97%   BMI 21.62 kg/m   Physical Exam Vitals and  nursing note reviewed.  Constitutional:      Appearance: He is well-developed.     Comments: Chronically ill-appearing but nontoxic, slightly jaundiced  HENT:     Head: Normocephalic and atraumatic.     Nose: Nose normal.     Mouth/Throat:     Mouth: Mucous membranes are moist.  Eyes:     Pupils: Pupils are equal, round, and reactive to light.  Cardiovascular:     Rate and Rhythm: Normal rate and regular rhythm.     Heart sounds: Normal heart sounds. No murmur heard. Pulmonary:     Effort: Pulmonary effort is normal. No respiratory distress.     Breath sounds: Normal breath sounds. No wheezing.  Abdominal:     General: Bowel sounds are normal. There is distension.     Palpations: Abdomen is soft.     Tenderness: There is no abdominal tenderness. There is no rebound.  Genitourinary:    Rectum: Guaiac result positive.  Comments: Red blood and melena noted Musculoskeletal:     Cervical back: Neck supple.     Right lower leg: No edema.     Left lower leg: No edema.  Lymphadenopathy:     Cervical: No cervical adenopathy.  Skin:    General: Skin is warm and dry.  Neurological:     Mental Status: He is alert and oriented to person, place, and time.  Psychiatric:        Mood and Affect: Mood normal.    ED Results / Procedures / Treatments   Labs (all labs ordered are listed, but only abnormal results are displayed) Labs Reviewed  COMPREHENSIVE METABOLIC PANEL - Abnormal; Notable for the following components:      Result Value   Chloride 113 (*)    CO2 16 (*)    Glucose, Bld 125 (*)    Calcium 8.0 (*)    Total Protein 5.4 (*)    Albumin 2.4 (*)    AST 100 (*)    ALT 54 (*)    All other components within normal limits  CBC - Abnormal; Notable for the following components:   RBC 2.42 (*)    Hemoglobin 6.9 (*)    HCT 22.7 (*)    RDW 20.1 (*)    nRBC 0.3 (*)    All other components within normal limits  RESP PANEL BY RT-PCR (FLU A&B, COVID) ARPGX2  POC OCCULT BLOOD,  ED  TYPE AND SCREEN  PREPARE RBC (CROSSMATCH)    EKG EKG Interpretation  Date/Time:  Sunday October 09 2020 05:44:41 EDT Ventricular Rate:  85 PR Interval:  159 QRS Duration: 106 QT Interval:  401 QTC Calculation: 477 R Axis:   29 Text Interpretation: Sinus rhythm Abnormal R-wave progression, early transition Borderline prolonged QT interval Confirmed by Thayer Jew [phone]) on 10/09/2020 6:36:33 AM  Radiology No results found.  Procedures .Critical Care Performed by: [name], MD Authorized by: [name], MD   Critical care provider statement:    Critical care time (minutes):  45   Critical care was necessary to treat or prevent imminent or life-threatening deterioration of the following conditions: GI bleed; symptomatic anemia requiring transfusion.   Critical care was time spent personally by me on the following activities:  Discussions with consultants, evaluation of patient's response to treatment, examination of patient, ordering and performing treatments and interventions, ordering and review of laboratory studies, ordering and review of radiographic studies, pulse oximetry, re-evaluation of patient's condition, obtaining history from patient or surrogate and review of old charts   Medications Ordered in ED Medications  pantoprazole (PROTONIX) 80 mg /NS 100 mL IVPB (has no administration in time range)  pantoprozole (PROTONIX) 80 mg /NS 100 mL infusion (has no administration in time range)  pantoprazole (PROTONIX) injection 40 mg (has no administration in time range)  0.9 %  sodium chloride infusion (has no administration in time range)    ED Course  I have reviewed the triage vital signs and the nursing notes.  Pertinent labs & imaging results that were available during my care of the patient were reviewed by me and considered in my medical decision making (see chart for details).    MDM Rules/Calculators/A&P                            Patient presents with several days of melena.  History of the same.  Has previously had fairly reassuring upper endoscopy.  Did have to be transferred to [place] at one time for enteroscopy.  He is hemodynamically stable.  He does report lightheadedness.  Reported hypotensive by EMS.  No abdominal pain.  Patient was given IV Protonix given history of alcohol abuse and grade 1 esophageal varices.  Hemoglobin 6.9.  We will plan for transfusion given presence of active bleeding and symptomatic anemia.  Will likely need GI consultation.  No established GI doc.  Has been evaluated by [facility] and [facility].   Final Clinical Impression(s) / ED Diagnoses Final diagnoses:  Melena  Iron deficiency anemia due to chronic blood loss    Rx / DC Orders ED Discharge Orders     None        Jimmey Hengel, Barbette Hair, MD 10/09/20 [id number]    [name], MD 10/09/20 [phone]

## 2020-10-09 NOTE — ED Notes (Signed)
Received message from floor RN that we could send the pt up.   Will be transported up now.

## 2020-10-09 NOTE — Plan of Care (Signed)
  Problem: Education: Goal: Knowledge of General Education information will improve Description: Including pain rating scale, medication(s)/side effects and non-pharmacologic comfort measures Outcome: Not Progressing   Problem: Health Behavior/Discharge Planning: Goal: Ability to manage health-related needs will improve Outcome: Not Progressing   Problem: Clinical Measurements: Goal: Ability to maintain clinical measurements within normal limits will improve Outcome: Not Progressing Goal: Will remain free from infection Outcome: Not Progressing Goal: Diagnostic test results will improve Outcome: Not Progressing Goal: Respiratory complications will improve Outcome: Not Progressing Goal: Cardiovascular complication will be avoided Outcome: Not Progressing   Problem: Activity: Goal: Risk for activity intolerance will decrease Outcome: Not Progressing   Problem: Nutrition: Goal: Adequate nutrition will be maintained Outcome: Not Progressing   Problem: Elimination: Goal: Will not experience complications related to bowel motility Outcome: Not Progressing Goal: Will not experience complications related to urinary retention Outcome: Not Progressing   Problem: Safety: Goal: Ability to remain free from injury will improve Outcome: Not Progressing   Problem: Skin Integrity: Goal: Risk for impaired skin integrity will decrease Outcome: Not Progressing   

## 2020-10-09 NOTE — ED Notes (Signed)
RN called unit to find name of RN getting report.  They were just notified, will initiate "Purple man"

## 2020-10-10 ENCOUNTER — Observation Stay (HOSPITAL_COMMUNITY): Payer: Medicaid Other | Admitting: Anesthesiology

## 2020-10-10 ENCOUNTER — Encounter (HOSPITAL_COMMUNITY)
Admission: EM | Disposition: A | Discharge status: Home / Self Care | Payer: Self-pay | Source: Home / Self Care | Attending: Family Medicine

## 2020-10-10 DIAGNOSIS — Z885 Allergy status to narcotic agent status: Secondary | ICD-10-CM | POA: Diagnosis not present

## 2020-10-10 DIAGNOSIS — K5520 Angiodysplasia of colon without hemorrhage: Secondary | ICD-10-CM

## 2020-10-10 DIAGNOSIS — D62 Acute posthemorrhagic anemia: Secondary | ICD-10-CM | POA: Diagnosis present

## 2020-10-10 DIAGNOSIS — K31819 Angiodysplasia of stomach and duodenum without bleeding: Secondary | ICD-10-CM

## 2020-10-10 DIAGNOSIS — I8510 Secondary esophageal varices without bleeding: Secondary | ICD-10-CM | POA: Diagnosis present

## 2020-10-10 DIAGNOSIS — F1010 Alcohol abuse, uncomplicated: Secondary | ICD-10-CM | POA: Diagnosis present

## 2020-10-10 DIAGNOSIS — K31811 Angiodysplasia of stomach and duodenum with bleeding: Secondary | ICD-10-CM

## 2020-10-10 DIAGNOSIS — Z20822 Contact with and (suspected) exposure to covid-19: Secondary | ICD-10-CM | POA: Diagnosis present

## 2020-10-10 DIAGNOSIS — I868 Varicose veins of other specified sites: Secondary | ICD-10-CM

## 2020-10-10 DIAGNOSIS — K922 Gastrointestinal hemorrhage, unspecified: Secondary | ICD-10-CM | POA: Diagnosis present

## 2020-10-10 DIAGNOSIS — K921 Melena: Secondary | ICD-10-CM | POA: Diagnosis present

## 2020-10-10 DIAGNOSIS — K766 Portal hypertension: Secondary | ICD-10-CM | POA: Diagnosis present

## 2020-10-10 DIAGNOSIS — K439 Ventral hernia without obstruction or gangrene: Secondary | ICD-10-CM | POA: Diagnosis present

## 2020-10-10 DIAGNOSIS — K317 Polyp of stomach and duodenum: Secondary | ICD-10-CM | POA: Diagnosis present

## 2020-10-10 DIAGNOSIS — Z79899 Other long term (current) drug therapy: Secondary | ICD-10-CM | POA: Diagnosis not present

## 2020-10-10 DIAGNOSIS — F17210 Nicotine dependence, cigarettes, uncomplicated: Secondary | ICD-10-CM | POA: Diagnosis present

## 2020-10-10 DIAGNOSIS — K5521 Angiodysplasia of colon with hemorrhage: Secondary | ICD-10-CM

## 2020-10-10 DIAGNOSIS — K7030 Alcoholic cirrhosis of liver without ascites: Secondary | ICD-10-CM | POA: Diagnosis present

## 2020-10-10 HISTORY — PX: BIOPSY: SHX5522

## 2020-10-10 HISTORY — PX: HOT HEMOSTASIS: SHX5433

## 2020-10-10 HISTORY — PX: ENTEROSCOPY: SHX5533

## 2020-10-10 LAB — HEMOGLOBIN AND HEMATOCRIT, BLOOD
HCT: 21.7 % — ABNORMAL LOW (ref 39.0–52.0)
HCT: 23.8 % — ABNORMAL LOW (ref 39.0–52.0)
HCT: 24.1 % — ABNORMAL LOW (ref 39.0–52.0)
HCT: 27.4 % — ABNORMAL LOW (ref 39.0–52.0)
Hemoglobin: 6.6 g/dL — CL (ref 13.0–17.0)
Hemoglobin: 7.7 g/dL — ABNORMAL LOW (ref 13.0–17.0)
Hemoglobin: 7.7 g/dL — ABNORMAL LOW (ref 13.0–17.0)
Hemoglobin: 8.5 g/dL — ABNORMAL LOW (ref 13.0–17.0)

## 2020-10-10 LAB — COMPREHENSIVE METABOLIC PANEL
ALT: 53 U/L — ABNORMAL HIGH (ref 0–44)
AST: 92 U/L — ABNORMAL HIGH (ref 15–41)
Albumin: 2.3 g/dL — ABNORMAL LOW (ref 3.5–5.0)
Alkaline Phosphatase: 92 U/L (ref 38–126)
Anion gap: 6 (ref 5–15)
BUN: 6 mg/dL — ABNORMAL LOW (ref 8–23)
CO2: 20 mmol/L — ABNORMAL LOW (ref 22–32)
Calcium: 7.9 mg/dL — ABNORMAL LOW (ref 8.9–10.3)
Chloride: 111 mmol/L (ref 98–111)
Creatinine, Ser: 0.94 mg/dL (ref 0.61–1.24)
GFR, Estimated: >60 mL/min (ref >60)
Glucose, Bld: 126 mg/dL — ABNORMAL HIGH (ref 70–99)
Potassium: 3.8 mmol/L (ref 3.5–5.1)
Sodium: 137 mmol/L (ref 135–145)
Total Bilirubin: 1 mg/dL (ref 0.3–1.2)
Total Protein: 5.6 g/dL — ABNORMAL LOW (ref 6.5–8.1)

## 2020-10-10 LAB — HEMOGLOBIN A1C
Hgb A1c MFr Bld: 4.9 % (ref 4.8–5.6)
Mean Plasma Glucose: 93.93 mg/dL

## 2020-10-10 LAB — PREPARE RBC (CROSSMATCH)

## 2020-10-10 SURGERY — ENTEROSCOPY
Anesthesia: Monitor Anesthesia Care

## 2020-10-10 MED ORDER — LACTATED RINGERS IV SOLN: INTRAVENOUS | Status: DC | PRN

## 2020-10-10 MED ORDER — PROPOFOL 500 MG/50ML IV EMUL
INTRAVENOUS | Status: DC | PRN
  Administered 2020-10-10: 100 ug/kg/min via INTRAVENOUS

## 2020-10-10 MED ORDER — ACETAMINOPHEN 650 MG RE SUPP: 650.0000 mg | Freq: Four times a day (QID) | RECTAL | Status: DC | PRN

## 2020-10-10 MED ORDER — SODIUM CHLORIDE 0.9 % IV SOLN: INTRAVENOUS | Status: DC

## 2020-10-10 MED ORDER — LIDOCAINE HCL (CARDIAC) PF 100 MG/5ML IV SOSY
PREFILLED_SYRINGE | INTRAVENOUS | Status: DC | PRN
  Administered 2020-10-10: 40 mg via INTRAVENOUS

## 2020-10-10 MED ORDER — ACETAMINOPHEN 325 MG PO TABS: 650.0000 mg | ORAL_TABLET | Freq: Three times a day (TID) | ORAL | Status: DC | PRN

## 2020-10-10 MED ORDER — SODIUM CHLORIDE 0.9% IV SOLUTION: Freq: Once | INTRAVENOUS | Status: AC

## 2020-10-10 MED ORDER — PROPOFOL 10 MG/ML IV BOLUS
INTRAVENOUS | Status: DC | PRN
  Administered 2020-10-10: 20 mg via INTRAVENOUS
  Administered 2020-10-10: 60 mg via INTRAVENOUS
  Administered 2020-10-10: 20 mg via INTRAVENOUS

## 2020-10-10 NOTE — TOC CAGE-AID Note (Signed)
Transition of Care [facility]) - CAGE-AID Screening   Patient Details  Name: Gavin Arroyo MRN: [id number] Date of Birth: 04-04-1955  Transition of Care [facility]) CM/SW Contact:    Joanne Chars, LCSW Phone Number: 10/10/2020, 3:07 PM   Clinical Narrative: CSW met with pt to complete Cage Aid.  Pt reports that he has made good progress in reducing his drinking.  Pt reports since being at [facility] (04/2019) pt has reduced his drinking from 1.5 fifths vodka per day to his current livel of 2-3 beers per day.  Pt denies any drug use. CSW complimented him on this progress, discussed the impacts of ETOH on his health.  Pt acknowledges this, reports he does want to stop drinking entirely. Pt does not want outside treatment currently but is willing for CSW to give him information on treatment provider in [place] if he needs it later.  Contact info for [facility] provided.       CAGE-AID Screening:    Have You Ever Felt You Ought to Cut Down on Your Drinking or Drug Use?: Yes Have People Annoyed You By Critizing Your Drinking Or Drug Use?: No Have You Felt Bad Or Guilty About Your Drinking Or Drug Use?: Yes Have You Ever Had a Drink or Used Drugs First Thing In The Morning to Steady Your Nerves or to Get Rid of a Hangover?: Yes CAGE-AID Score: 3  Substance Abuse Education Offered: Yes  Substance abuse interventions: Patient Counseling, Other (must comment) ([facility] contact information)

## 2020-10-10 NOTE — Interval H&P Note (Signed)
History and Physical Interval Note:  No changes in patient's symptoms or medical status overnight.  Patient had another dark red/maroon bowel movement this morning.  Hgb dropped a point to 6.6 and was given another unit pRBCs with improvement to 7.7.  10/10/2020 10:12 AM  Gavin Arroyo  has presented today for surgery, with the diagnosis of GI bleed; anemia.  The various methods of treatment have been discussed with the patient and family. After consideration of risks, benefits and other options for treatment, the patient has consented to  Procedure(s): ENTEROSCOPY (N/A) as a surgical intervention.  The patient's history has been reviewed, patient examined, no change in status, stable for surgery.  I have reviewed the patient's chart and labs.  Questions were answered to the patient's satisfaction.     Daryel November

## 2020-10-10 NOTE — Anesthesia Postprocedure Evaluation (Signed)
Anesthesia Post Note  Patient: Gavin Arroyo  Procedure(s) Performed: ENTEROSCOPY HOT HEMOSTASIS (ARGON PLASMA COAGULATION/BICAP) BIOPSY     Patient location during evaluation: PACU Anesthesia Type: MAC Level of consciousness: awake and alert and oriented Pain management: pain level controlled Vital Signs Assessment: post-procedure vital signs reviewed and stable Respiratory status: spontaneous breathing, nonlabored ventilation and respiratory function stable Cardiovascular status: blood pressure returned to baseline Postop Assessment: no apparent nausea or vomiting Anesthetic complications: no   No notable events documented.  Last Vitals:  Vitals:   10/10/20 1130 10/10/20 1319  BP: (!) 116/52 115/65  Pulse: 65 65  Resp: 14 15  Temp:    SpO2: 100% 99%    Last Pain:  Vitals:   10/10/20 1130  TempSrc:   PainSc: 0-No pain                 Marthenia Rolling

## 2020-10-10 NOTE — Progress Notes (Signed)
Critical Hgb of 6.6 called 10/10/20 0040. Assigned RN made aware of critical lab.

## 2020-10-10 NOTE — Transfer of Care (Signed)
Immediate Anesthesia Transfer of Care Note  Patient: Gavin Arroyo  Procedure(s) Performed: ENTEROSCOPY HOT HEMOSTASIS (ARGON PLASMA COAGULATION/BICAP) BIOPSY  Patient Location: Endoscopy Unit  Anesthesia Type:MAC  Level of Consciousness: drowsy and patient cooperative  Airway & Oxygen Therapy: Patient Spontanous Breathing and Patient connected to face mask oxygen  Post-op Assessment: Report given to RN, Post -op Vital signs reviewed and stable and Patient moving all extremities X 4  Post vital signs: Reviewed and stable  Last Vitals:  Vitals Value Taken Time  BP 92/51 10/10/20 1112  Temp    Pulse 64 10/10/20 1112  Resp 22 10/10/20 1112  SpO2 100 % 10/10/20 1112    Last Pain:  Vitals:   10/10/20 1112  TempSrc:   PainSc: Asleep         Complications: No notable events documented.

## 2020-10-10 NOTE — Anesthesia Procedure Notes (Signed)
Procedure Name: MAC Date/Time: 10/10/2020 10:17 AM Performed by: Leonor Liv, CRNA Pre-anesthesia Checklist: Patient identified, Emergency Drugs available, Suction available, Patient being monitored and Timeout performed Patient Re-evaluated:Patient Re-evaluated prior to induction Oxygen Delivery Method: Simple face mask Placement Confirmation: positive ETCO2 Dental Injury: Teeth and Oropharynx as per pre-operative assessment

## 2020-10-10 NOTE — Anesthesia Preprocedure Evaluation (Addendum)
Anesthesia Evaluation  Patient identified by MRN, date of birth, ID band Patient awake    Reviewed: Allergy & Precautions, NPO status , Patient's Chart, lab work & pertinent test results  History of Anesthesia Complications Negative for: history of anesthetic complications  Airway Mallampati: II  TM Distance: >3 FB Neck ROM: Full    Dental  (+) Missing, Poor Dentition,    Pulmonary Current Smoker,    Pulmonary exam normal        Cardiovascular negative cardio ROS Normal cardiovascular exam     Neuro/Psych negative neurological ROS  negative psych ROS   GI/Hepatic negative GI ROS, (+) Cirrhosis   Esophageal Varices  substance abuse  alcohol use and cocaine use,   Endo/Other  negative endocrine ROS  Renal/GU negative Renal ROS  negative genitourinary   Musculoskeletal negative musculoskeletal ROS (+)   Abdominal   Peds  Hematology  (+) anemia , Hgb 7.7, plt 157k   Anesthesia Other Findings Day of surgery medications reviewed with patient.  Reproductive/Obstetrics negative OB ROS                            Anesthesia Physical Anesthesia Plan  ASA: 4  Anesthesia Plan: MAC   Post-op Pain Management:    Induction:   PONV Risk Score and Plan: 1 and Treatment may vary due to age or medical condition and Propofol infusion  Airway Management Planned: Natural Airway and Nasal Cannula  Additional Equipment: None  Intra-op Plan:   Post-operative Plan:   Informed Consent: I have reviewed the patients History and Physical, chart, labs and discussed the procedure including the risks, benefits and alternatives for the proposed anesthesia with the patient or authorized representative who has indicated his/her understanding and acceptance.       Plan Discussed with: CRNA  Anesthesia Plan Comments:        Anesthesia Quick Evaluation

## 2020-10-10 NOTE — Op Note (Signed)
[facility] Patient Name: Gavin Arroyo Procedure Date : 10/10/2020 MRN: [id number] Attending MD: Gladstone Pih. Candis Schatz , MD Date of Birth: 28-Feb-1955 CSN: [id number] Age: 65 Admit Type: Inpatient Procedure:                Small bowel enteroscopy Indications:              Arteriovenous malformation in the stomach,                            Arteriovenous malformation in the small intestine Providers:                Sephiroth Mcluckie E. Candis Schatz, MD, Dulcy Fanny, Tyna Jaksch Technician Referring MD:              Medicines:                Monitored Anesthesia Care Complications:            No immediate complications. Estimated Blood Loss:     Estimated blood loss was minimal. Procedure:                Pre-Anesthesia Assessment:                           - Prior to the procedure, a History and Physical                            was performed, and patient medications and                            allergies were reviewed. The patient's tolerance of                            previous anesthesia was also reviewed. The risks                            and benefits of the procedure and the sedation                            options and risks were discussed with the patient.                            All questions were answered, and informed consent                            was obtained. Prior Anticoagulants: The patient has                            taken no previous anticoagulant or antiplatelet                            agents. ASA Grade Assessment: IV - A patient with  severe systemic disease that is a constant threat                            to life. After reviewing the risks and benefits,                            the patient was deemed in satisfactory condition to                            undergo the procedure.                           After obtaining informed consent, the endoscope was                             passed under direct vision. Throughout the                            procedure, the patient's blood pressure, pulse, and                            oxygen saturations were monitored continuously. The                            [id number]-[id number] ([id number]) Olympus peds colonoscope was                            introduced through the mouth and advanced to the                            jejunum, to the 150 cm mark (from the incisors).                            The small bowel enteroscopy was accomplished                            without difficulty. The patient tolerated the                            procedure well. Scope In: Scope Out: Findings:      Grade I varices were found in the lower third of the esophagus. No       bleeding stigmata      The exam of the esophagus was otherwise normal.      A small amount of food/medication residue was found in the gastric body       and in the gastric antrum. This was cleared with lavage/suction.      Mild portal hypertensive gastropathy was found in the gastric body.      A single small angioectasia with no bleeding was found in the gastric       fundus. Coagulation for tissue destruction using argon plasma was       successful. Estimated blood loss: none.      A single 10 mm sessile polyp with no bleeding was found in the duodenal  bulb. Biopsies were taken with a cold forceps for histology. Estimated       blood loss was minimal.      Grade II varices were found in the second portion of the duodenum. They       were medium in size. No bleeding stigmata      The exam of the duodenum was otherwise normal.      A single angioectasia with no bleeding was found in the proximal       jejunum. Coagulation for tissue destruction using argon plasma was       successful. Estimated blood loss: none.      Exam of the jejunum was otherwise normal. Impression:               - Grade I esophageal varices.                           - A small amount of  food/medication residue in the                            stomach.                           - Portal hypertensive gastropathy.                           - A single non-bleeding angioectasia in the                            stomach. Treated with argon plasma coagulation                            (APC).                           - A single duodenal polyp. Biopsied.                           - Grade II duodenal varices.                           - A single non-bleeding angioectasia in the                            jejunum. Treated with argon plasma coagulation                            (APC).                           - Suspect patient's GI bleed was secondary to AVMs                            rather than variceal. Recommendation:           - Return patient to hospital ward for ongoing care.                           - Recommend advancing diet as  tolerated                           - Continue octreotide drip for today                           - Continue PPI BID, can switch to PO                           - Trend CBC daily.                           - If patient has evidence of ongoing bleeding,                            would perform capsule endoscopy Procedure Code(s):        --- Professional ---                           [phone], Small intestinal endoscopy, enteroscopy                            beyond second portion of duodenum, not including                            ileum; with ablation of tumor(s), polyp(s), or                            other lesion(s) not amenable to removal by hot                            biopsy forceps, bipolar cautery or snare technique Diagnosis Code(s):        --- Professional ---                           I85.00, Esophageal varices without bleeding                           T18.2XXA, Foreign body in stomach, initial encounter                           K76.6, Portal hypertension                           K31.89, Other diseases of stomach and  duodenum                           K31.819, Angiodysplasia of stomach and duodenum                            without bleeding                           K31.7, Polyp of stomach and duodenum  I86.8, Varicose veins of other specified sites                           K55.20, Angiodysplasia of colon without hemorrhage CPT copyright 2019 [employer]. All rights reserved. The codes documented in this report are preliminary and upon coder review may  be revised to meet current compliance requirements. Jordan Pardini E. Candis Schatz, MD 10/10/2020 11:18:58 AM This report has been signed electronically. Number of Addenda: 0

## 2020-10-10 NOTE — Progress Notes (Signed)
PROGRESS NOTE    Gavin Arroyo  HUD:[id number] DOB: 09-27-1955 DOA: 10/09/2020 PCP: [name], NP   Brief Narrative:  HPI: Gavin Arroyo is a 65 y.o. male with medical history significant of heavy alcohol abuse, cirrhosis (likely from alcohol related liver disease), grade 1 esophageal varices and portal hypertensive gastropathy, a complicated history of GI bleed requiring transfer to [facility] in April 2021, remote perforated abdominal ulcer, ventral hernia,who presented with melena.   Patient was hospitalized for GI bleed at [facility] in March 2021. He had received 34 units of PRBCs in about 1 month of hospitalization before he was transferred to [facility] for double-balloon enteroscopy. He has had a complicated hospital stay at [facility] and Endoscopic procedures. Patient states that he has since cut down his alcohol consumption, and only drinks 2-4 beers daily. Denies abuse liquor or liquor.  His last drink was yesterday afternoon.   Patient states that he started to have intermittent melena about 3 days ago.  Associate symptoms included SOB/DOE, intermittent blurry vision, generalized weakness and fatigue.  Denies syncope or near syncope episodes.  Denies hematemesis, bright red blood per rectum, nausea, vomiting, abdominal pain.  Denies fevers, chills, cough, wheezing, chest pain, dysuria, urinary frequency or urgency.  Patient came to ED for further evaluation.  In the emergency room, he was afebrile with a pulse 94, RR 18, BP 103/56.  The labs showed WBC 6.6, Hgb 6.9, PLT 157. Per ED provider FOBT positive. Patient was started on Protonix IV and PRBC transfusion at the ED.   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  Further drop to 6.6 requiring monitor.  Transfusion.  Will transfuse 7.7.  Patient started on octreotide and Protonix drip.  GI consulted.  Patient underwent EGD 10/10/2020 with the findings as above.  GI recommends continuing octreotide and PPI for now.  Monitor overnight.  If further bleeding, capsule endoscopy is the next plan.  Monitor H&H every 12 hours.  History of alcohol abuse with liver cirrhosis: Admits drinking 2-4 beers daily despite of strong recommendation to quit altogether.  Continue CIWA protocol.  No signs of withdrawal for now.  Liver mass: Patient has known 2 small masses in the left and right hepatic lobes.  Repeated MRI today shows a stable mass size.  Recommend repeating MRI in 6 months.  Right hand pain: Patient complains of right hand pain since about a month.  He believes right hand is swollen and he believes he has gout.  On examination, the hand looks completely normal.  There is no swelling or tenderness or warmth.  He is able to flex and extend all his fingers.  There is no signs of any infection or inflammation or gout.  He tells me he was taking ibuprofen prior to coming here.  With his GI bleed history, it is not a good idea.  With his liver cirrhosis history, Tylenol also is  not a good idea but low-dose is acceptable.  No indication to treat this with opioids.  We will start with warm compresses.  DVT prophylaxis: SCDs Start: 10/09/20 0901   Code Status: Full Code  Family Communication:  None present at bedside.  Plan of care discussed with patient in length and he verbalized understanding and  agreed with it.  Status is: Inpatient  Remains inpatient appropriate because:Ongoing diagnostic testing needed not appropriate for outpatient work up  Dispo: The patient is from: Home              Anticipated d/c is to: Home              Patient currently is not medically stable to d/c.   Difficult to place patient No        Estimated body mass index is 21.62 kg/m as calculated from the following:   Height as of this encounter: 5\' 11"  (7.169 m).   Weight as of this encounter: 70.3 kg.     Nutritional Assessment: Body mass index is 21.62 kg/m.Marland Kitchen Seen by dietician.  I agree with the assessment and plan as outlined below: Nutrition Status:        .  Skin Assessment: I have examined the patient's skin and I agree with the wound assessment as performed by the wound care RN as outlined below:    Consultants:  GI  Procedures:  EGD  Antimicrobials:  Anti-infectives (From admission, onward)    None          Subjective: Seen and examined.  Only complaint he has is right hand pain.  Discussion mentioned as above.  Objective: Vitals:   10/10/20 0920 10/10/20 1112 10/10/20 1120 10/10/20 1130  BP: 135/73 (!) 92/51 (!) 124/49 (!) 116/52  Pulse: 79 64 62 65  Resp: 15 (!) 22 (!) 27 14  Temp: 98.4 F (36.9 C) 98.2 F (36.8 C)    TempSrc: Temporal Temporal    SpO2: 98% 100% 99% 100%  Weight:      Height:        Intake/Output Summary (Last 24 hours) at 10/10/2020 1205 Last data filed at 10/10/2020 1101 Gross per 24 hour  Intake 800 ml  Output --  Net 800 ml   Filed Weights   10/09/20 0521  Weight: 70.3 kg    Examination:  General exam: Appears calm and comfortable  Respiratory system: Clear to auscultation. Respiratory effort normal. Cardiovascular system: S1 & S2 heard, RRR. No JVD, murmurs, rubs, gallops or clicks. No pedal edema. Gastrointestinal system: Abdomen is nondistended, soft and nontender. No organomegaly or masses felt. Normal bowel  sounds heard. Central nervous system: Alert and oriented. No focal neurological deficits. Extremities: Symmetric 5 x 5 power. Skin: No rashes, lesions or ulcers Psychiatry: Judgement and insight appear normal. Mood & affect appropriate.    Data Reviewed: I have personally reviewed following labs and imaging studies  CBC: Recent Labs  Lab 10/09/20 0533 10/09/20 1751 10/10/20 0007 10/10/20 0548  WBC 6.6  --   --   --   HGB 6.9* 7.5* 6.6* 7.7*  HCT 22.7* 24.5* 21.7* 23.8*  MCV 93.8  --   --   --   PLT 157  --   --   --    Basic Metabolic Panel: Recent Labs  Lab 10/09/20 0533 10/10/20 0548  NA 140 137  K 3.7 3.8  CL 113* 111  CO2 16* 20*  GLUCOSE 125* 126*  BUN 11 6*  CREATININE  0.92 0.94  CALCIUM 8.0* 7.9*   GFR: Estimated Creatinine Clearance: 80 mL/min (by C-G formula based on SCr of 0.94 mg/dL). Liver Function Tests: Recent Labs  Lab 10/09/20 0533 10/10/20 0548  AST 100* 92*  ALT 54* 53*  ALKPHOS 107 92  BILITOT 0.5 1.0  PROT 5.4* 5.6*  ALBUMIN 2.4* 2.3*   No results for input(s): LIPASE, AMYLASE in the last 168 hours. No results for input(s): AMMONIA in the last 168 hours. Coagulation Profile: Recent Labs  Lab 10/09/20 1751  INR 1.3*   Cardiac Enzymes: No results for input(s): CKTOTAL, CKMB, CKMBINDEX, TROPONINI in the last 168 hours. BNP (last 3 results) No results for input(s): PROBNP in the last 8760 hours. HbA1C: Recent Labs    10/10/20 0007  HGBA1C 4.9   CBG: No results for input(s): GLUCAP in the last 168 hours. Lipid Profile: No results for input(s): CHOL, HDL, LDLCALC, TRIG, CHOLHDL, LDLDIRECT in the last 72 hours. Thyroid Function Tests: No results for input(s): TSH, T4TOTAL, FREET4, T3FREE, THYROIDAB in the last 72 hours. Anemia Panel: No results for input(s): VITAMINB12, FOLATE, FERRITIN, TIBC, IRON, RETICCTPCT in the last 72 hours. Sepsis Labs: No results for input(s): PROCALCITON, LATICACIDVEN in the last 168 hours.  Recent  Results (from the past 240 hour(s))  Resp Panel by RT-PCR (Flu A&B, Covid) Nasopharyngeal Swab     Status: None   Collection Time: 10/09/20  7:12 AM   Specimen: Nasopharyngeal Swab; Nasopharyngeal(NP) swabs in vial transport medium  Result Value Ref Range Status   SARS Coronavirus 2 by RT PCR NEGATIVE NEGATIVE Final    Comment: (NOTE) SARS-CoV-2 target nucleic acids are NOT DETECTED.  The SARS-CoV-2 RNA is generally detectable in upper respiratory specimens during the acute phase of infection. The lowest concentration of SARS-CoV-2 viral copies this assay can detect is 138 copies/mL. A negative result does not preclude SARS-Cov-2 infection and should not be used as the sole basis for treatment or other patient management decisions. A negative result may occur with  improper specimen collection/handling, submission of specimen other than nasopharyngeal swab, presence of viral mutation(s) within the areas targeted by this assay, and inadequate number of viral copies(<138 copies/mL). A negative result must be combined with clinical observations, patient history, and epidemiological information. The expected result is Negative.  Fact Sheet for Patients:  EntrepreneurPulse.com.au  Fact Sheet for Healthcare Providers:  IncredibleEmployment.be  This test is no t yet approved or cleared by the [place] FDA and  has been authorized for detection and/or diagnosis of SARS-CoV-2 by FDA under an Emergency Use Authorization (EUA). This EUA will remain  in effect (meaning this test can be used) for the duration of the COVID-19 declaration under Section 564(b)(1) of the Act, 21 U.S.C.section 360bbb-3(b)(1), unless the authorization is terminated  or revoked sooner.       Influenza A by PCR NEGATIVE NEGATIVE Final   Influenza B by PCR NEGATIVE NEGATIVE Final    Comment: (NOTE) The Xpert Xpress SARS-CoV-2/FLU/RSV plus assay is intended as an aid in the  diagnosis of influenza from Nasopharyngeal swab specimens and should not be used as a sole basis for treatment. Nasal washings and aspirates are unacceptable for Xpert Xpress SARS-CoV-2/FLU/RSV testing.  Fact Sheet for Patients: EntrepreneurPulse.com.au  Fact Sheet for Healthcare Providers: IncredibleEmployment.be  This test is not yet approved or cleared by the [place] FDA and has been authorized for detection and/or diagnosis of SARS-CoV-2 by FDA under an Emergency Use Authorization (EUA). This EUA will remain in  effect (meaning this test can be used) for the duration of the COVID-19 declaration under Section 564(b)(1) of the Act, 21 U.S.C. section 360bbb-3(b)(1), unless the authorization is terminated or revoked.  Performed at [facility], [place] [address] [place], [place] [address]       Radiology Studies: MR LIVER W WO CONTRAST  Result Date: 10/09/2020 CLINICAL DATA:  Alcoholic cirrhosis. Follow-up indeterminate liver lesions. EXAM: MRI ABDOMEN WITHOUT AND WITH CONTRAST TECHNIQUE: Multiplanar multisequence MR imaging of the abdomen was performed both before and after the administration of intravenous contrast. CONTRAST:  71mL GADAVIST GADOBUTROL 1 MMOL/ML IV SOLN COMPARISON:  03/17/2019 FINDINGS: Lower chest: No acute findings. Hepatobiliary: Advanced hepatic cirrhosis is again demonstrated, with more confluent areas of fibrosis in the right lobe compared to the left. A 1.7 cm hypovascular with mild peripheral rim enhancement lesion is again seen in the anterior left hepatic lobe on image 42/17, and is stable in size and appearance. A 1.9 cm hypovascular lesion with peripheral rim enhancement in segment 7 of the right lobe also remains stable (image 41/17). A tiny sub-cm cyst near the junction of the anterior and posterior segments of the right hepatic lobe remains stable (image 36/17). No hypervascular liver masses, or new or enlarging  liver lesions identified. Gallbladder is unremarkable. No evidence of biliary ductal dilatation. Pancreas:  No mass or inflammatory changes. Spleen:  Within normal limits in size and appearance. Adrenals/Urinary Tract: No masses identified. No evidence of hydronephrosis. Stomach/Bowel: A moderate paraumbilical ventral hernia is seen which contains a loop of transverse colon. This shows no significant change since previous study, and there is no evidence of bowel obstruction or strangulation. Vascular/Lymphatic: No pathologically enlarged lymph nodes identified. No acute vascular findings. Esophageal varices again seen, consistent with portal venous hypertension. Other:  None.  No evidence of ascites. Musculoskeletal:  No suspicious bone lesions identified. IMPRESSION: Hepatic cirrhosis, with esophageal varices consistent with portal venous hypertension. Two small hypovascular masses in right and left hepatic lobes remains stable, and are indeterminate. Continued follow-up is recommended by MRI in 6 months. (LI-RADS Category 3: Intermediate probability of malignancy) Stable moderate paraumbilical ventral hernia containing a loop of transverse colon. No radiographic evidence of bowel obstruction or strangulation. Electronically Signed   By: Marlaine Hind M.D.   On: 10/09/2020 16:19    Scheduled Meds:  folic acid  1 mg Oral Daily   multivitamin with minerals  1 tablet Oral Daily   [START ON 10/12/2020] pantoprazole  40 mg Intravenous Q12H   thiamine  100 mg Oral Daily   Or   thiamine  100 mg Intravenous Daily   Continuous Infusions:  octreotide  (SANDOSTATIN)    IV infusion 50 mcg/hr (10/10/20 1131)   pantoprazole 8 mg/hr (10/10/20 1017)     LOS: 0 days   Time spent: 32 minutes   Darliss Cheney, MD [facility]  10/10/2020, 12:05 PM  Please page via [facility] and do not message via secure chat for anything urgent. Secure chat can be used for anything non urgent.  How to contact the [facility] Attending  or Consulting provider [place] or covering provider during after hours [place], for this patient?  Check the care team in [facility] and look for a) attending/consulting [facility] provider listed and b) the [facility] team listed. Page or secure chat [phone]. Log into [email or web address] and use [place]'s universal password to access. If you do not have the password, please contact the hospital operator. Locate the [facility] provider  you are looking for under [facility] and page to a number that you can be directly reached. If you still have difficulty reaching the provider, please page the [facility] (Director on Call) for the Hospitalists listed on amion for assistance.

## 2020-10-11 DIAGNOSIS — K922 Gastrointestinal hemorrhage, unspecified: Secondary | ICD-10-CM | POA: Diagnosis not present

## 2020-10-11 DIAGNOSIS — I868 Varicose veins of other specified sites: Secondary | ICD-10-CM

## 2020-10-11 LAB — BASIC METABOLIC PANEL
Anion gap: 8 (ref 5–15)
BUN: <5 mg/dL — ABNORMAL LOW (ref 8–23)
CO2: 21 mmol/L — ABNORMAL LOW (ref 22–32)
Calcium: 8.1 mg/dL — ABNORMAL LOW (ref 8.9–10.3)
Chloride: 107 mmol/L (ref 98–111)
Creatinine, Ser: 0.93 mg/dL (ref 0.61–1.24)
GFR, Estimated: >60 mL/min (ref >60)
Glucose, Bld: 116 mg/dL — ABNORMAL HIGH (ref 70–99)
Potassium: 3.7 mmol/L (ref 3.5–5.1)
Sodium: 136 mmol/L (ref 135–145)

## 2020-10-11 LAB — URINE DRUGS OF ABUSE SCREEN W ALC, ROUTINE (REF LAB)
Amphetamines, Urine: NEGATIVE ng/mL
Barbiturate, Ur: NEGATIVE ng/mL
Benzodiazepine Quant, Ur: NEGATIVE ng/mL
Cannabinoid Quant, Ur: NEGATIVE ng/mL
Cocaine (Metab.): NEGATIVE ng/mL
Ethanol U, Quan: NEGATIVE %
Methadone Screen, Urine: NEGATIVE ng/mL
Opiate Quant, Ur: NEGATIVE ng/mL
Phencyclidine, Ur: NEGATIVE ng/mL
Propoxyphene, Urine: NEGATIVE ng/mL

## 2020-10-11 LAB — TYPE AND SCREEN
ABO/RH(D): A POS
Antibody Screen: NEGATIVE
Unit division: 0
Unit division: 0

## 2020-10-11 LAB — BPAM RBC
Blood Product Expiration Date: 202210272359
Blood Product Expiration Date: 202210302359
ISSUE DATE / TIME: 202210020744
ISSUE DATE / TIME: 202210030126
Unit Type and Rh: 6200
Unit Type and Rh: 6200

## 2020-10-11 LAB — AFP TUMOR MARKER: AFP, Serum, Tumor Marker: 2.7 ng/mL (ref 0.0–8.4)

## 2020-10-11 LAB — HEMOGLOBIN AND HEMATOCRIT, BLOOD
HCT: 23.6 % — ABNORMAL LOW (ref 39.0–52.0)
Hemoglobin: 7.5 g/dL — ABNORMAL LOW (ref 13.0–17.0)

## 2020-10-11 LAB — SURGICAL PATHOLOGY

## 2020-10-11 MED ORDER — PANTOPRAZOLE SODIUM 40 MG PO TBEC: 40.0000 mg | DELAYED_RELEASE_TABLET | Freq: Two times a day (BID) | ORAL | 2 refills | Status: AC

## 2020-10-11 NOTE — Progress Notes (Signed)
[facility] ROUNDING NOTE   Subjective: No acute events overnight.  Patient had some 'rabbit turds' yesterday, but no bloody bowel movements.  Hgb stable at 7.5.  Patient wondering when he can be discharged.   Objective: Vital signs in last 24 hours: Temp:  [98.2 F (36.8 C)-98.4 F (36.9 C)] 98.4 F (36.9 C) (10/03 2001) Pulse Rate:  [62-79] 78 (10/03 2001) Resp:  [14-27] 16 (10/03 2001) BP: (92-135)/(49-73) 122/62 (10/03 2001) SpO2:  [98 %-100 %] 100 % (10/03 2001) Last BM Date: 10/08/20 General: NAD Lungs: CTA b/l, no w/r/r Heart: RRR, no m/r/g Abdomen: Soft, NT, ND, +BS Ext: No c/c/e    Intake/Output from previous day: 10/03 0701 - 10/04 0700 In: 1105.1 [I.V.:1105.1] Out: 600 [Urine:600] Intake/Output this shift: No intake/output data recorded.   Lab Results: Recent Labs    10/09/20 0533 10/09/20 1751 10/10/20 1223 10/10/20 1648 10/11/20 0434  WBC 6.6  --   --   --   --   HGB 6.9*   < > 8.5* 7.7* 7.5*  PLT 157  --   --   --   --   MCV 93.8  --   --   --   --    < > = values in this interval not displayed.   BMET Recent Labs    10/09/20 0533 10/10/20 0548 10/11/20 0434  NA 140 137 136  K 3.7 3.8 3.7  CL 113* 111 107  CO2 16* 20* 21*  GLUCOSE 125* 126* 116*  BUN 11 6* <5*  CREATININE 0.92 0.94 0.93  CALCIUM 8.0* 7.9* 8.1*   LFT Recent Labs    10/09/20 0533 10/10/20 0548  PROT 5.4* 5.6*  ALBUMIN 2.4* 2.3*  AST 100* 92*  ALT 54* 53*  ALKPHOS 107 92  BILITOT 0.5 1.0   PT/INR Recent Labs    10/09/20 1751  INR 1.3*      Imaging/Other results: MR LIVER W WO CONTRAST  Result Date: 10/09/2020 CLINICAL DATA:  Alcoholic cirrhosis. Follow-up indeterminate liver lesions. EXAM: MRI ABDOMEN WITHOUT AND WITH CONTRAST TECHNIQUE: Multiplanar multisequence MR imaging of the abdomen was performed both before and after the administration of intravenous contrast. CONTRAST:  47mL GADAVIST GADOBUTROL 1 MMOL/ML IV SOLN COMPARISON:   03/17/2019 FINDINGS: Lower chest: No acute findings. Hepatobiliary: Advanced hepatic cirrhosis is again demonstrated, with more confluent areas of fibrosis in the right lobe compared to the left. A 1.7 cm hypovascular with mild peripheral rim enhancement lesion is again seen in the anterior left hepatic lobe on image 42/17, and is stable in size and appearance. A 1.9 cm hypovascular lesion with peripheral rim enhancement in segment 7 of the right lobe also remains stable (image 41/17). A tiny sub-cm cyst near the junction of the anterior and posterior segments of the right hepatic lobe remains stable (image 36/17). No hypervascular liver masses, or new or enlarging liver lesions identified. Gallbladder is unremarkable. No evidence of biliary ductal dilatation. Pancreas:  No mass or inflammatory changes. Spleen:  Within normal limits in size and appearance. Adrenals/Urinary Tract: No masses identified. No evidence of hydronephrosis. Stomach/Bowel: A moderate paraumbilical ventral hernia is seen which contains a loop of transverse colon. This shows no significant change since previous study, and there is no evidence of bowel obstruction or strangulation. Vascular/Lymphatic: No pathologically enlarged lymph nodes identified. No acute vascular findings. Esophageal varices again seen, consistent with portal venous hypertension. Other:  None.  No evidence of ascites. Musculoskeletal:  No suspicious bone lesions identified. IMPRESSION:  Hepatic cirrhosis, with esophageal varices consistent with portal venous hypertension. Two small hypovascular masses in right and left hepatic lobes remains stable, and are indeterminate. Continued follow-up is recommended by MRI in 6 months. (LI-RADS Category 3: Intermediate probability of malignancy) Stable moderate paraumbilical ventral hernia containing a loop of transverse colon. No radiographic evidence of bowel obstruction or strangulation. Electronically Signed   By: Marlaine Hind  M.D.   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  No further evidence of bleeding overnight.   Patient is ok from GI standpoint for discharge today.   Recommend outpatient iron supplementation to counteract frequent GI bleeding.   Patient would like to follow up with me as an outpatient for further management of his cirrhosis.  Our office will contact him later this week to set up a follow up appointment.      Daryel November, MD  10/11/2020, 8:41 AM [facility]

## 2020-10-11 NOTE — Discharge Summary (Signed)
Physician Discharge Summary  PANCHO RUSHING HUT:[id number] DOB: 1955/07/30 DOA: 10/09/2020  PCP: [name], NP  Admit date: 10/09/2020 Discharge date: 10/11/2020 30 Day Unplanned Readmission Risk Score    Flowsheet Row ED to Hosp-Admission (Current) from 10/09/2020 in [place] [facility]  30 Day Unplanned Readmission Risk Score (%) 10.45 Filed at 10/11/2020 0801       This score is the patient's risk of an unplanned readmission within 30 days of being discharged (0 -100%). The score is based on dignosis, age, lab data, medications, orders, and past utilization.   Low:  0-14.9   Medium: 15-21.9   High: 22-29.9   Extreme: 30 and above          Admitted From: Home Disposition: Home  Recommendations for Outpatient Follow-up:  Follow up with PCP in 1-2 weeks Please obtain BMP/CBC in one week Follow-up with GI, they will call for appointment if needed Please follow up with your PCP on the following pending results: Unresulted Labs (From admission, onward)     Start     Ordered   10/10/20 1700  Hemoglobin and hematocrit, blood  5A & 5P,   R (with TIMED occurrences)     Question:  Specimen collection method  Answer:  Lab=Lab collect   10/10/20 1324   10/09/20 0851  Urine drugs of abuse scrn w alc, routine (Ref Lab)  Once,   R        10/09/20 0851              Home Health: None Equipment/Devices: None  Discharge Condition: Stable CODE STATUS: Full code Diet recommendation: Low-sodium  Subjective: Seen and examined.  No complaints.  Ready to go home.  Following HPI is copied from my colleague admitting hospitalist Dr. Nicoletta Dress, Na, HPI: JACKY DROSS is a 65 y.o. male with medical history significant of heavy alcohol abuse, cirrhosis (likely from alcohol related liver disease), grade 1 esophageal varices and portal hypertensive gastropathy, a complicated history of GI bleed requiring transfer to [facility] in April 2021, remote perforated abdominal ulcer, ventral  hernia,who presented with melena.   Patient was hospitalized for GI bleed at [facility] in March 2021. He had received 34 units of PRBCs in about 1 month of hospitalization before he was transferred to [facility] for double-balloon enteroscopy. He has had a complicated hospital stay at [facility] and Endoscopic procedures. Patient states that he has since cut down his alcohol consumption, and only drinks 2-4 beers daily. Denies abuse liquor or liquor.  His last drink was yesterday afternoon.   Patient states that he started to have intermittent melena about 3 days ago.  Associate symptoms included SOB/DOE, intermittent blurry vision, generalized weakness and fatigue.  Denies syncope or near syncope episodes.  Denies hematemesis, bright red blood per rectum, nausea, vomiting, abdominal pain.  Denies fevers, chills, cough, wheezing, chest pain, dysuria, urinary frequency or urgency.  Patient came to ED for further evaluation.  In the emergency room, he was afebrile with a pulse 94, RR 18, BP 103/56.  The labs showed WBC 6.6, Hgb 6.9, PLT 157. Per ED provider FOBT positive. Patient was started on Protonix IV and PRBC transfusion at the ED.   Brief/Interim Summary: Brief hospitalization course as below.  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  It further dropped to 6.6  requiring 1 unit of transfusion.  Patient started on octreotide and Protonix drip.  GI consulted.  Patient underwent EGD 10/10/2020 with the findings as above.  GI recommended continuing octreotide and PPI for now overnight and to monitor overnight.  Patient's hemoglobin has remained stable since yesterday and he has not had any episodes of melena or GI bleeding.  GI has cleared him for discharge.  Recommendations to continue omeprazole 40  mg p.o. twice daily and to avoid NSAIDs altogether.    History of alcohol abuse with liver cirrhosis: Admits drinking 2-4 beers daily despite of strong recommendation to quit altogether.  Continue CIWA protocol.  No signs of withdrawal for now.   Liver mass: Patient has known 2 small masses in the left and right hepatic lobes.  Repeated MRI today shows a stable mass size.  Recommend repeating MRI in 6 months.   Right hand pain: Patient complained of right hand pain since about a month.  He believes right hand is swollen and he believes he has gout.  On examination, the hand looks completely normal.  There is no swelling or tenderness or warmth.  He is able to flex and extend all his fingers.  There is no signs of any infection or inflammation or gout.  He tells me he was taking ibuprofen prior to coming here.  With his GI bleed history, it is not a good idea.  With his liver cirrhosis history, Tylenol also is not a good idea but low-dose is acceptable.  No indication to treat this with opioids.  We treated with only warm compresses and patient's pain has improved.  Discharge Diagnoses:  Principal Problem:   GIB (gastrointestinal bleeding) Active Problems:   Acute blood loss anemia   Hepatic cirrhosis (HCC)   Alcohol abuse   Portal hypertensive gastropathy (HCC)   AVM (arteriovenous malformation) of stomach, acquired with hemorrhage   AVM (arteriovenous malformation) of small bowel, acquired with hemorrhage   Secondary esophageal varices without bleeding (HCC)   Duodenal varices   Upper GI bleeding    Discharge Instructions   Allergies as of 10/11/2020       Reactions   Vicodin [hydrocodone-acetaminophen] Itching        Medication List     TAKE these medications    ferrous sulfate 325 (65 FE) MG tablet Take 1 tablet (325 mg total) by mouth daily with breakfast.   ibuprofen 200 MG tablet Commonly known as: ADVIL Take 200 mg by mouth every 6 (six) hours as needed for mild  pain.   multivitamin with minerals Tabs tablet Take 1 tablet by mouth daily.   nadolol 40 MG tablet Commonly known as: Corgard Take 1 tablet (40 mg total) by mouth daily.   pantoprazole 40 MG tablet Commonly known as: Protonix Take 1 tablet (40 mg total) by mouth 2 (two) times daily.        Follow-up Information     [name], NP Follow up in 1 week(s).   Contact information: [place] [address] [phone]                Allergies  Allergen Reactions   Vicodin [Hydrocodone-Acetaminophen] Itching    Consultations: GI   Procedures/Studies: MR LIVER W WO CONTRAST  Result Date: 10/09/2020 CLINICAL DATA:  Alcoholic cirrhosis. Follow-up indeterminate liver lesions. EXAM: MRI ABDOMEN WITHOUT AND WITH CONTRAST TECHNIQUE: Multiplanar multisequence MR imaging of the abdomen was performed both before and after the administration of intravenous contrast. CONTRAST:  67mL GADAVIST GADOBUTROL 1 MMOL/ML IV SOLN COMPARISON:  03/17/2019 FINDINGS: Lower chest: No acute findings. Hepatobiliary: Advanced hepatic cirrhosis is again demonstrated, with more confluent areas of fibrosis in the right lobe compared to the left. A 1.7 cm hypovascular with mild peripheral rim enhancement lesion is again seen in the anterior left hepatic lobe on image 42/17, and is stable in size and appearance. A 1.9 cm hypovascular lesion with peripheral rim enhancement in segment 7 of the right lobe also remains stable (image 41/17). A tiny sub-cm cyst near the junction of the anterior and posterior segments of the right hepatic lobe remains stable (image 36/17). No hypervascular liver masses, or new or enlarging liver lesions identified. Gallbladder is unremarkable. No evidence of biliary ductal dilatation. Pancreas:  No mass or inflammatory changes. Spleen:  Within normal limits in size and appearance. Adrenals/Urinary Tract: No masses identified. No evidence of hydronephrosis. Stomach/Bowel: A  moderate paraumbilical ventral hernia is seen which contains a loop of transverse colon. This shows no significant change since previous study, and there is no evidence of bowel obstruction or strangulation. Vascular/Lymphatic: No pathologically enlarged lymph nodes identified. No acute vascular findings. Esophageal varices again seen, consistent with portal venous hypertension. Other:  None.  No evidence of ascites. Musculoskeletal:  No suspicious bone lesions identified. IMPRESSION: Hepatic cirrhosis, with esophageal varices consistent with portal venous hypertension. Two small hypovascular masses in right and left hepatic lobes remains stable, and are indeterminate. Continued follow-up is recommended by MRI in 6 months. (LI-RADS Category 3: Intermediate probability of malignancy) Stable moderate paraumbilical ventral hernia containing a loop of transverse colon. No radiographic evidence of bowel obstruction or strangulation. Electronically Signed   By: Marlaine Hind M.D.   On: 10/09/2020 16:19     Discharge Exam: Vitals:   10/10/20 1319 10/10/20 2001  BP: 115/65 122/62  Pulse: 65 78  Resp: 15 16  Temp:  98.4 F (36.9 C)  SpO2: 99% 100%   Vitals:   10/10/20 1120 10/10/20 1130 10/10/20 1319 10/10/20 2001  BP: (!) 124/49 (!) 116/52 115/65 122/62  Pulse: 62 65 65 78  Resp: (!) 27 14 15 16   Temp:    98.4 F (36.9 C)  TempSrc:    Oral  SpO2: 99% 100% 99% 100%  Weight:      Height:        General: Pt is alert, awake, not in acute distress Cardiovascular: RRR, S1/S2 +, no rubs, no gallops Respiratory: CTA bilaterally, no wheezing, no rhonchi Abdominal: Soft, NT, ND, bowel sounds +, ventral hernia which is reducible. Extremities: no edema, no cyanosis    The results of significant diagnostics from this hospitalization (including imaging, microbiology, ancillary and laboratory) are listed below for reference.     Microbiology: Recent Results (from the past 240 hour(s))  Resp Panel by  RT-PCR (Flu A&B, Covid) Nasopharyngeal Swab     Status: None   Collection Time: 10/09/20  7:12 AM   Specimen: Nasopharyngeal Swab; Nasopharyngeal(NP) swabs in vial transport medium  Result Value Ref Range Status   SARS Coronavirus 2 by RT PCR NEGATIVE NEGATIVE Final    Comment: (NOTE) SARS-CoV-2 target nucleic acids are NOT DETECTED.  The SARS-CoV-2 RNA is generally detectable in upper respiratory specimens during the acute phase of infection. The lowest concentration of SARS-CoV-2 viral copies this assay can detect is 138 copies/mL. A negative result does not preclude SARS-Cov-2 infection and should not be used as the sole basis for treatment or other patient management decisions. A  negative result may occur with  improper specimen collection/handling, submission of specimen other than nasopharyngeal swab, presence of viral mutation(s) within the areas targeted by this assay, and inadequate number of viral copies(<138 copies/mL). A negative result must be combined with clinical observations, patient history, and epidemiological information. The expected result is Negative.  Fact Sheet for Patients:  EntrepreneurPulse.com.au  Fact Sheet for Healthcare Providers:  IncredibleEmployment.be  This test is no t yet approved or cleared by the [place] FDA and  has been authorized for detection and/or diagnosis of SARS-CoV-2 by FDA under an Emergency Use Authorization (EUA). This EUA will remain  in effect (meaning this test can be used) for the duration of the COVID-19 declaration under Section 564(b)(1) of the Act, 21 U.S.C.section 360bbb-3(b)(1), unless the authorization is terminated  or revoked sooner.       Influenza A by PCR NEGATIVE NEGATIVE Final   Influenza B by PCR NEGATIVE NEGATIVE Final    Comment: (NOTE) The Xpert Xpress SARS-CoV-2/FLU/RSV plus assay is intended as an aid in the diagnosis of influenza from Nasopharyngeal swab  specimens and should not be used as a sole basis for treatment. Nasal washings and aspirates are unacceptable for Xpert Xpress SARS-CoV-2/FLU/RSV testing.  Fact Sheet for Patients: EntrepreneurPulse.com.au  Fact Sheet for Healthcare Providers: IncredibleEmployment.be  This test is not yet approved or cleared by the [place] FDA and has been authorized for detection and/or diagnosis of SARS-CoV-2 by FDA under an Emergency Use Authorization (EUA). This EUA will remain in effect (meaning this test can be used) for the duration of the COVID-19 declaration under Section 564(b)(1) of the Act, 21 U.S.C. section 360bbb-3(b)(1), unless the authorization is terminated or revoked.  Performed at [facility], [place] [address] [place], [place] [address]      Labs: BNP (last 3 results) No results for input(s): BNP in the last 8760 hours. Basic Metabolic Panel: Recent Labs  Lab 10/09/20 0533 10/10/20 0548 10/11/20 0434  NA 140 137 136  K 3.7 3.8 3.7  CL 113* 111 107  CO2 16* 20* 21*  GLUCOSE 125* 126* 116*  BUN 11 6* <5*  CREATININE 0.92 0.94 0.93  CALCIUM 8.0* 7.9* 8.1*   Liver Function Tests: Recent Labs  Lab 10/09/20 0533 10/10/20 0548  AST 100* 92*  ALT 54* 53*  ALKPHOS 107 92  BILITOT 0.5 1.0  PROT 5.4* 5.6*  ALBUMIN 2.4* 2.3*   No results for input(s): LIPASE, AMYLASE in the last 168 hours. No results for input(s): AMMONIA in the last 168 hours. CBC: Recent Labs  Lab 10/09/20 0533 10/09/20 1751 10/10/20 0007 10/10/20 0548 10/10/20 1223 10/10/20 1648 10/11/20 0434  WBC 6.6  --   --   --   --   --   --   HGB 6.9*   < > 6.6* 7.7* 8.5* 7.7* 7.5*  HCT 22.7*   < > 21.7* 23.8* 27.4* 24.1* 23.6*  MCV 93.8  --   --   --   --   --   --   PLT 157  --   --   --   --   --   --    < > = values in this interval not displayed.   Cardiac Enzymes: No results for input(s): CKTOTAL, CKMB, CKMBINDEX, TROPONINI in the last 168  hours. BNP: Invalid input(s): POCBNP CBG: No results for input(s): GLUCAP in the last 168 hours. D-Dimer No results for input(s): DDIMER in the last 72 hours. Hgb  A1c Recent Labs    10/10/20 0007  HGBA1C 4.9   Lipid Profile No results for input(s): CHOL, HDL, LDLCALC, TRIG, CHOLHDL, LDLDIRECT in the last 72 hours. Thyroid function studies No results for input(s): TSH, T4TOTAL, T3FREE, THYROIDAB in the last 72 hours.  Invalid input(s): FREET3 Anemia work up No results for input(s): VITAMINB12, FOLATE, FERRITIN, TIBC, IRON, RETICCTPCT in the last 72 hours. Urinalysis    Component Value Date/Time   COLORURINE YELLOW 03/13/2020 0150   APPEARANCEUR CLEAR 03/13/2020 0150   LABSPEC 1.020 03/13/2020 0150   PHURINE 5.0 03/13/2020 0150   GLUCOSEU NEGATIVE 03/13/2020 0150   HGBUR NEGATIVE 03/13/2020 0150   BILIRUBINUR NEGATIVE 03/13/2020 0150   KETONESUR NEGATIVE 03/13/2020 0150   PROTEINUR NEGATIVE 03/13/2020 0150   NITRITE NEGATIVE 03/13/2020 0150   LEUKOCYTESUR NEGATIVE 03/13/2020 0150   Sepsis Labs Invalid input(s): PROCALCITONIN,  WBC,  LACTICIDVEN Microbiology Recent Results (from the past 240 hour(s))  Resp Panel by RT-PCR (Flu A&B, Covid) Nasopharyngeal Swab     Status: None   Collection Time: 10/09/20  7:12 AM   Specimen: Nasopharyngeal Swab; Nasopharyngeal(NP) swabs in vial transport medium  Result Value Ref Range Status   SARS Coronavirus 2 by RT PCR NEGATIVE NEGATIVE Final    Comment: (NOTE) SARS-CoV-2 target nucleic acids are NOT DETECTED.  The SARS-CoV-2 RNA is generally detectable in upper respiratory specimens during the acute phase of infection. The lowest concentration of SARS-CoV-2 viral copies this assay can detect is 138 copies/mL. A negative result does not preclude SARS-Cov-2 infection and should not be used as the sole basis for treatment or other patient management decisions. A negative result may occur with  improper specimen collection/handling,  submission of specimen other than nasopharyngeal swab, presence of viral mutation(s) within the areas targeted by this assay, and inadequate number of viral copies(<138 copies/mL). A negative result must be combined with clinical observations, patient history, and epidemiological information. The expected result is Negative.  Fact Sheet for Patients:  EntrepreneurPulse.com.au  Fact Sheet for Healthcare Providers:  IncredibleEmployment.be  This test is no t yet approved or cleared by the [place] FDA and  has been authorized for detection and/or diagnosis of SARS-CoV-2 by FDA under an Emergency Use Authorization (EUA). This EUA will remain  in effect (meaning this test can be used) for the duration of the COVID-19 declaration under Section 564(b)(1) of the Act, 21 U.S.C.section 360bbb-3(b)(1), unless the authorization is terminated  or revoked sooner.       Influenza A by PCR NEGATIVE NEGATIVE Final   Influenza B by PCR NEGATIVE NEGATIVE Final    Comment: (NOTE) The Xpert Xpress SARS-CoV-2/FLU/RSV plus assay is intended as an aid in the diagnosis of influenza from Nasopharyngeal swab specimens and should not be used as a sole basis for treatment. Nasal washings and aspirates are unacceptable for Xpert Xpress SARS-CoV-2/FLU/RSV testing.  Fact Sheet for Patients: EntrepreneurPulse.com.au  Fact Sheet for Healthcare Providers: IncredibleEmployment.be  This test is not yet approved or cleared by the [place] FDA and has been authorized for detection and/or diagnosis of SARS-CoV-2 by FDA under an Emergency Use Authorization (EUA). This EUA will remain in effect (meaning this test can be used) for the duration of the COVID-19 declaration under Section 564(b)(1) of the Act, 21 U.S.C. section 360bbb-3(b)(1), unless the authorization is terminated or revoked.  Performed at [facility], [place] [address], [place], [place] [address]      Time coordinating discharge: Over 30 minutes  SIGNED:  Darliss Cheney, MD  [facility] 10/11/2020, 10:07 AM  If 7PM-7AM, please contact night-coverage [email or web address]

## 2020-10-12 ENCOUNTER — Encounter (HOSPITAL_COMMUNITY): Payer: Self-pay | Admitting: Gastroenterology

## 2020-10-13 ENCOUNTER — Telehealth: Payer: Self-pay

## 2020-10-13 NOTE — Telephone Encounter (Signed)
Left VM for patient to call me back to Schedule an outpatient clinic appointment with Dr.Cunningham.

## 2020-10-19 ENCOUNTER — Encounter: Payer: Self-pay | Admitting: Gastroenterology

## 2021-06-11 IMAGING — CT CT ABD-PELV W/ CM
2 of 5 series · 14 of 46 positions shown, 16 images · IV contrast (omnipaque)
Comparison: None.

CLINICAL DATA: 63-year-old with GI bleed.  Abdominal pain.

EXAM:
CT ABDOMEN AND PELVIS WITH CONTRAST
TECHNIQUE: Multidetector CT imaging of the abdomen and pelvis was performed
using the standard protocol following bolus administration of
intravenous contrast.
CONTRAST:  80mL OMNIPAQUE IOHEXOL 300 MG/ML  SOLN

[Series 3: abdomen 5.0 · axial · 0.65mm/px · z∈[-491,-61]mm · 11 of 102 slices shown, 13 images]
[im 8/102  soft-tissue]
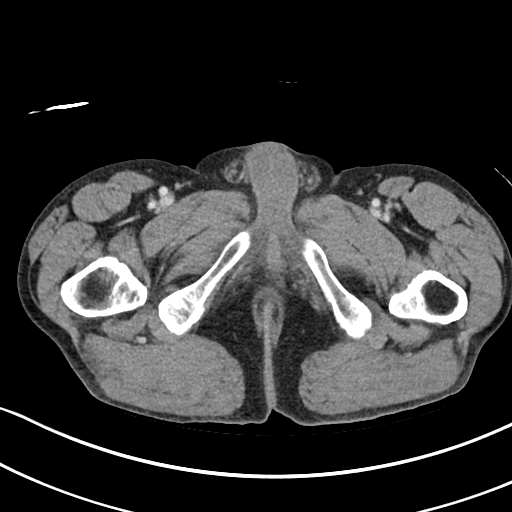
[im 8/102  bone]
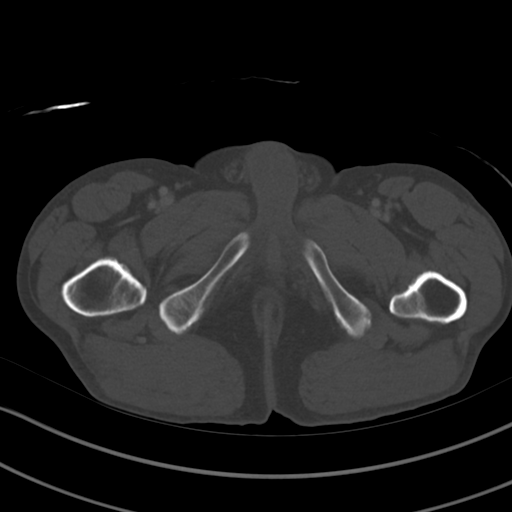
[im 15/102  soft-tissue]
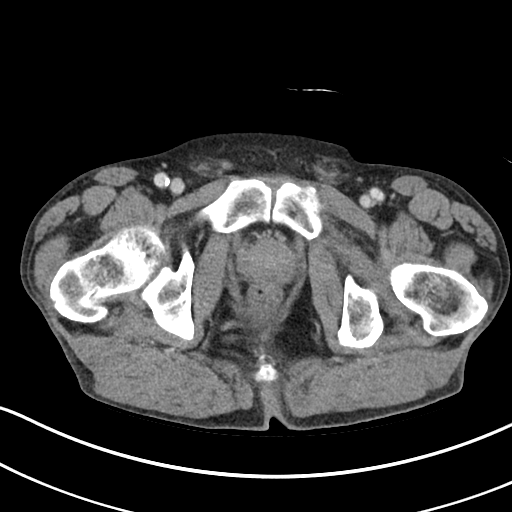
[im 22/102  soft-tissue]
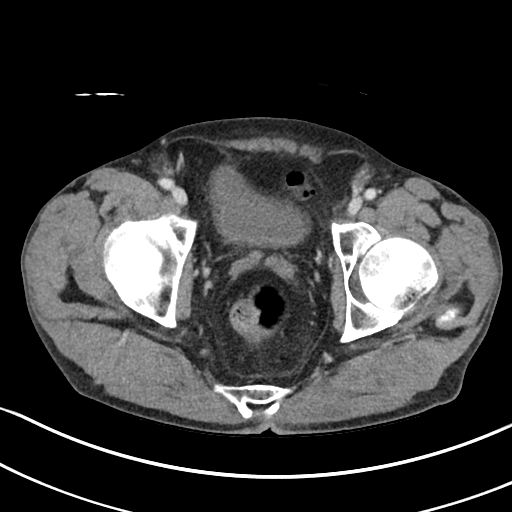
[im 37/102  soft-tissue]
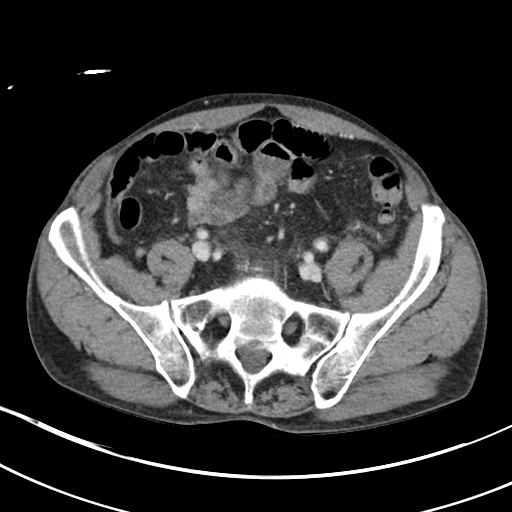
[im 44/102  soft-tissue]
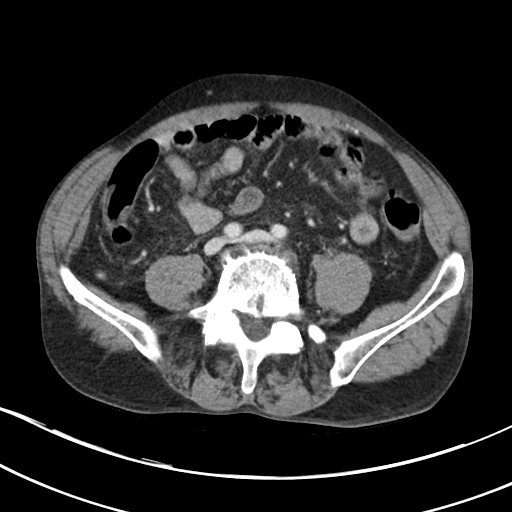
[im 51/102  soft-tissue]
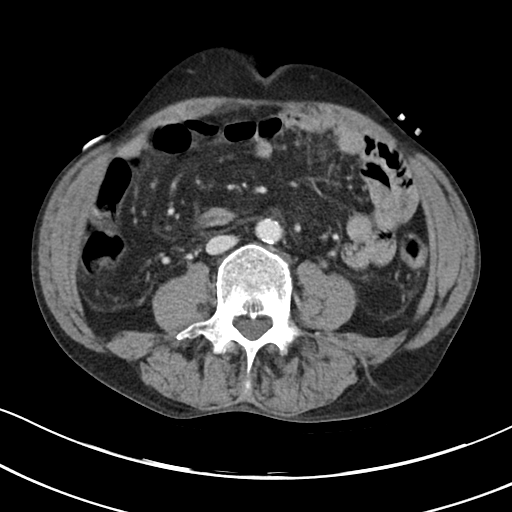
[im 58/102  soft-tissue]
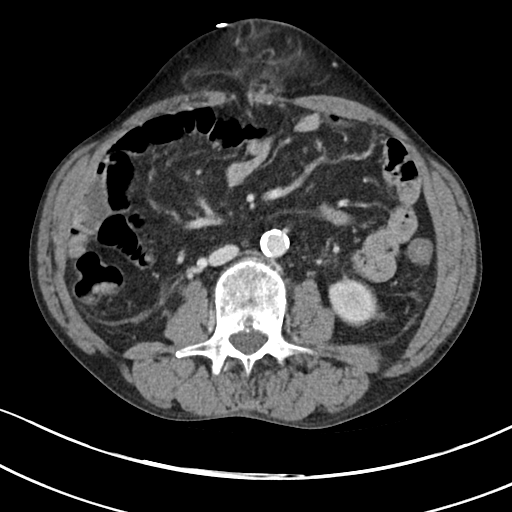
[im 65/102  soft-tissue]
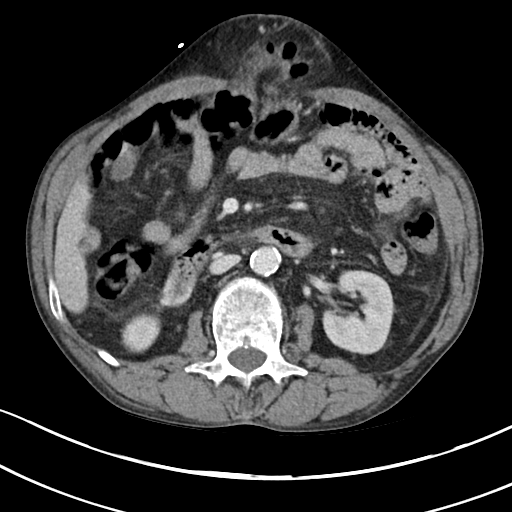
[im 80/102  soft-tissue]
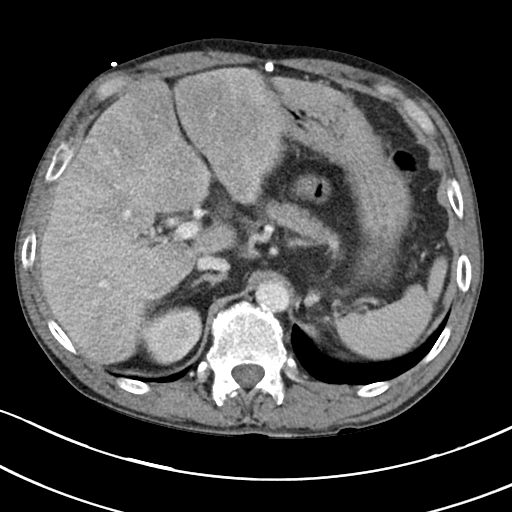
[im 80/102  bone]
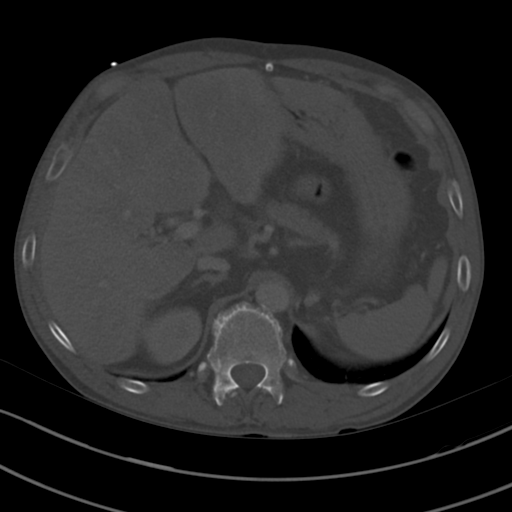
[im 87/102  soft-tissue]
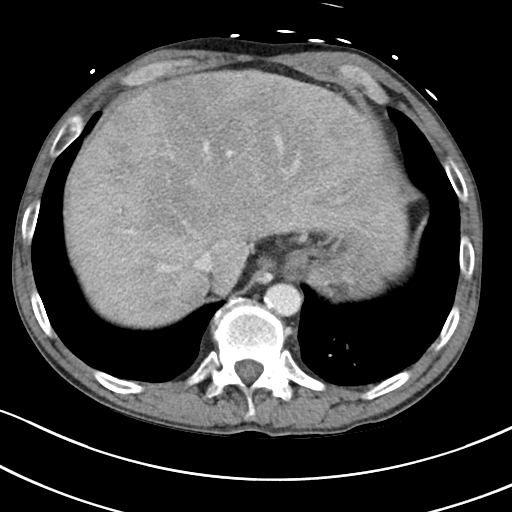
[im 94/102  soft-tissue]
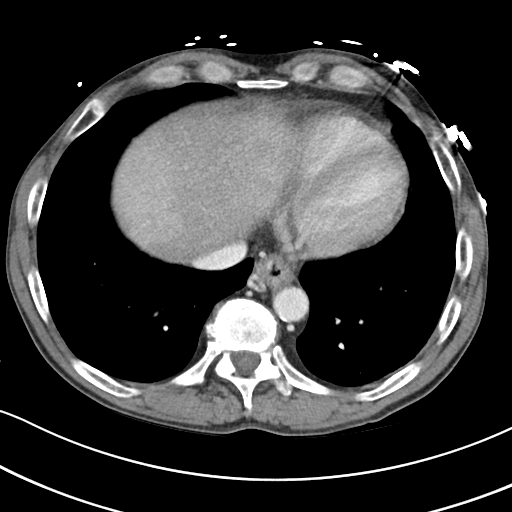

[Series 6: abdomen 3.0 mpr cor · coronal · 0.65mm/px · 3 of 94 slices shown]
[im 32/94  soft-tissue]
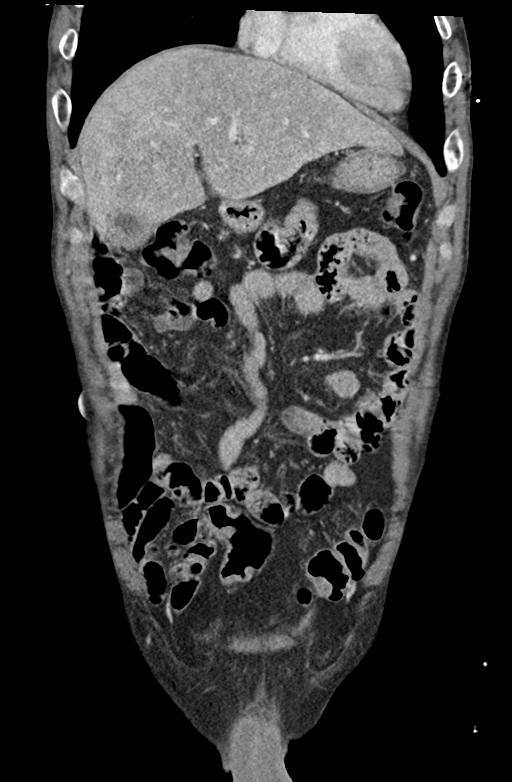
[im 42/94  soft-tissue]
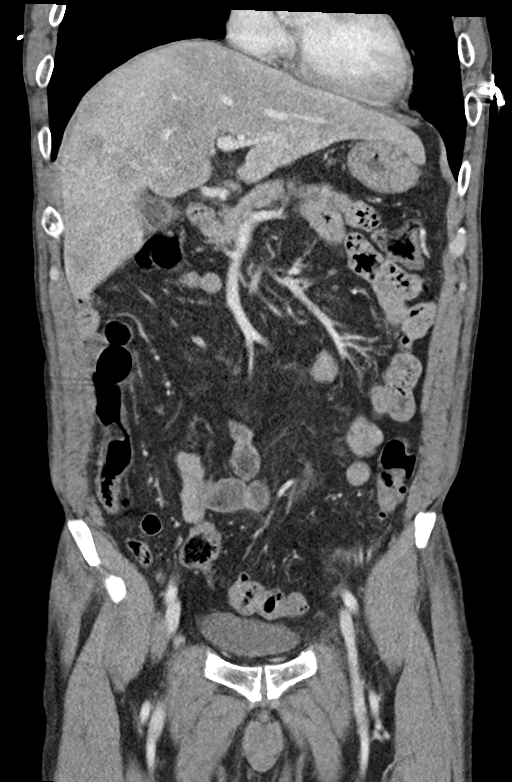
[im 52/94  soft-tissue]
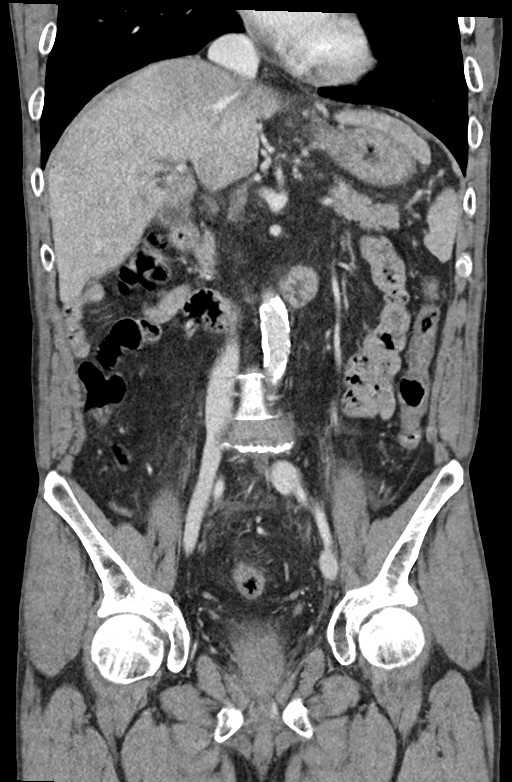

[14 of 46 positions shown; findings below may reference images not displayed]

FINDINGS: Lower chest: Lung bases are clear.

Hepatobiliary: Liver is diffusely heterogeneous with patchy areas of
heterogeneity and low density. Subtle 9 mm low-density in the right
hepatic lobe on sequence 3, image 17 is nonspecific but could
represent a small cyst. 11 mm low-density structure along the medial
aspect of the right hepatic lobe on image 21. The main portal venous
system is patent. Normal appearance of the gallbladder without
distension. No biliary dilatation. Left lobe of the liver is
prominent and concern for mild nodularity in the liver. Questionable
lesion along the anterior left hepatic lobe on sequence 8, image 3.

Pancreas: Unremarkable. No pancreatic ductal dilatation or
surrounding inflammatory changes.

Spleen: Normal in size without focal abnormality.

Adrenals/Urinary Tract: Normal appearance of the adrenal glands.
Normal appearance of both kidneys. Negative for hydronephrosis. No
suspicious renal lesion. Normal appearance of the urinary bladder.

Stomach/Bowel: Small varices around the distal esophagus. Normal
appearance of stomach and duodenum. Diverticula involving the
sigmoid colon. Ventral hernia containing a loop of the transverse
colon. There appears to be a normal appendix in a retrocecal
location. No evidence for focal bowel inflammation or bowel
obstruction. Mild mesenteric edema throughout the abdomen
particularly in the posterior lower abdomen on sequence 3, image 69.

Vascular/Lymphatic: Atherosclerotic calcifications involving the
abdominal aorta without aneurysm. Main mesenteric arteries are
patent. Portal venous system is patent. Esophageal varices.

Reproductive: Prostate is unremarkable.

Other: Evidence for small inguinal hernias containing fat. There
appears to be 2 adjacent ventral hernias. The more cephalad hernia
contains a loop of transverse colon and mesentery. There is mild
mesenteric edema in this area. The more caudal and adjacent ventral
hernia contains fat and mesenteric vessels. No evidence for ascites.
Negative for free air.

Musculoskeletal: Disc space narrowing at L5-S1. Irregularity along
the superior endplate of L3 and L4 probably related to Schmorl's
nodes.
IMPRESSION: 1. Adjacent ventral hernias. The more cephalad hernia contains
transverse colon, fat and mesentery. More caudal ventral hernia
contains mesenteric vessels and fat. No evidence for bowel
obstruction or focal bowel inflammation.
2. Abnormal liver. Liver is diffusely heterogeneous even on the
delayed images. In addition, there is mild nodularity of the liver
raising concern for underlying cirrhosis. Heterogeneity may also be
related to diffuse fatty infiltration. There is a indeterminate 9 mm
lesion in the anterior left hepatic lobe. Recommend follow-up MRI of
the liver, with and without contrast, to evaluate this hepatic
lesion and evaluate for underlying liver disease.
3. Mild mesenteric edema throughout the abdomen and pelvis is
nonspecific. No ascites.
4. Small esophageal varices and possible cirrhosis.
5.  Aortic Atherosclerosis ([id number]-P48.8).

## 2021-12-28 ENCOUNTER — Other Ambulatory Visit: Payer: Self-pay | Admitting: Interventional Radiology

## 2021-12-28 DIAGNOSIS — R160 Hepatomegaly, not elsewhere classified: Secondary | ICD-10-CM

## 2022-01-30 ENCOUNTER — Other Ambulatory Visit: Payer: Medicaid Other

## 2022-01-30 ENCOUNTER — Inpatient Hospital Stay: Admission: RE | Admit: 2022-01-30 | Payer: Medicaid Other | Source: Ambulatory Visit

## 2022-01-31 ENCOUNTER — Other Ambulatory Visit: Payer: Self-pay | Admitting: Interventional Radiology

## 2022-01-31 DIAGNOSIS — R160 Hepatomegaly, not elsewhere classified: Secondary | ICD-10-CM

## 2022-02-02 ENCOUNTER — Telehealth: Payer: Self-pay | Admitting: Interventional Radiology

## 2022-02-02 NOTE — Telephone Encounter (Signed)
Left appointment information 02/15/22 8:30am at [facility] and prep

## 2023-09-11 ENCOUNTER — Encounter: Payer: Self-pay | Admitting: Adult Health
